# Patient Record
Sex: Female | Born: 1935
Health system: Southern US, Community
[De-identification: ages and names within clinical notes are randomized; demographics above are authoritative.]

## PROBLEM LIST (undated history)

## (undated) DIAGNOSIS — I10 Essential (primary) hypertension: Secondary | ICD-10-CM

## (undated) DIAGNOSIS — D126 Benign neoplasm of colon, unspecified: Secondary | ICD-10-CM

## (undated) DIAGNOSIS — E039 Hypothyroidism, unspecified: Secondary | ICD-10-CM

## (undated) DIAGNOSIS — G629 Polyneuropathy, unspecified: Secondary | ICD-10-CM

## (undated) DIAGNOSIS — K5790 Diverticulosis of intestine, part unspecified, without perforation or abscess without bleeding: Secondary | ICD-10-CM

## (undated) DIAGNOSIS — K219 Gastro-esophageal reflux disease without esophagitis: Secondary | ICD-10-CM

## (undated) DIAGNOSIS — C541 Malignant neoplasm of endometrium: Secondary | ICD-10-CM

## (undated) DIAGNOSIS — H8110 Benign paroxysmal vertigo, unspecified ear: Secondary | ICD-10-CM

## (undated) DIAGNOSIS — R413 Other amnesia: Secondary | ICD-10-CM

## (undated) HISTORY — DX: Polyneuropathy, unspecified: G62.9

## (undated) HISTORY — PX: CATARACT EXTRACTION: SUR2

## (undated) HISTORY — DX: Diverticulosis of intestine, part unspecified, without perforation or abscess without bleeding: K57.90

## (undated) HISTORY — PX: CHOLECYSTECTOMY: SHX55

## (undated) HISTORY — DX: Benign neoplasm of colon, unspecified: D12.6

## (undated) HISTORY — PX: FOOT SURGERY: SHX648

## (undated) HISTORY — DX: Benign paroxysmal vertigo, unspecified ear: H81.10

## (undated) HISTORY — DX: Essential (primary) hypertension: I10

## (undated) HISTORY — PX: VAGINAL HYSTERECTOMY: SUR661

## (undated) HISTORY — DX: Gastro-esophageal reflux disease without esophagitis: K21.9

## (undated) HISTORY — PX: KNEE SURGERY: SHX244

## (undated) HISTORY — DX: Malignant neoplasm of endometrium: C54.1

## (undated) HISTORY — DX: Hypothyroidism, unspecified: E03.9

## (undated) HISTORY — DX: Other amnesia: R41.3

---

## 1997-01-30 ENCOUNTER — Encounter: Payer: Self-pay | Admitting: Gastroenterology

## 1997-07-11 ENCOUNTER — Ambulatory Visit (HOSPITAL_COMMUNITY): Admission: RE | Admit: 1997-07-11 | Discharge: 1997-07-11 | Payer: Self-pay | Admitting: Family Medicine

## 1997-08-07 ENCOUNTER — Other Ambulatory Visit: Admission: RE | Admit: 1997-08-07 | Discharge: 1997-08-07 | Payer: Self-pay | Admitting: Family Medicine

## 1998-04-12 DIAGNOSIS — D126 Benign neoplasm of colon, unspecified: Secondary | ICD-10-CM

## 1998-04-12 HISTORY — DX: Benign neoplasm of colon, unspecified: D12.6

## 1998-04-22 ENCOUNTER — Other Ambulatory Visit: Admission: RE | Admit: 1998-04-22 | Discharge: 1998-04-22 | Payer: Self-pay | Admitting: Gastroenterology

## 1998-04-22 ENCOUNTER — Encounter: Payer: Self-pay | Admitting: Gastroenterology

## 1998-07-17 ENCOUNTER — Ambulatory Visit (HOSPITAL_COMMUNITY): Admission: RE | Admit: 1998-07-17 | Discharge: 1998-07-17 | Payer: Self-pay | Admitting: Family Medicine

## 1998-10-06 ENCOUNTER — Encounter (INDEPENDENT_AMBULATORY_CARE_PROVIDER_SITE_OTHER): Payer: Self-pay

## 1998-10-06 ENCOUNTER — Inpatient Hospital Stay (HOSPITAL_COMMUNITY): Admission: RE | Admit: 1998-10-06 | Discharge: 1998-10-10 | Payer: Self-pay | Admitting: *Deleted

## 1999-01-15 ENCOUNTER — Other Ambulatory Visit: Admission: RE | Admit: 1999-01-15 | Discharge: 1999-01-15 | Payer: Self-pay | Admitting: *Deleted

## 1999-04-16 ENCOUNTER — Other Ambulatory Visit: Admission: RE | Admit: 1999-04-16 | Discharge: 1999-04-16 | Payer: Self-pay | Admitting: *Deleted

## 1999-07-20 ENCOUNTER — Ambulatory Visit (HOSPITAL_COMMUNITY): Admission: RE | Admit: 1999-07-20 | Discharge: 1999-07-20 | Payer: Self-pay | Admitting: *Deleted

## 1999-08-04 ENCOUNTER — Other Ambulatory Visit: Admission: RE | Admit: 1999-08-04 | Discharge: 1999-08-04 | Payer: Self-pay | Admitting: *Deleted

## 1999-10-30 ENCOUNTER — Other Ambulatory Visit: Admission: RE | Admit: 1999-10-30 | Discharge: 1999-10-30 | Payer: Self-pay | Admitting: *Deleted

## 2000-03-11 ENCOUNTER — Other Ambulatory Visit: Admission: RE | Admit: 2000-03-11 | Discharge: 2000-03-11 | Payer: Self-pay | Admitting: *Deleted

## 2000-06-10 ENCOUNTER — Other Ambulatory Visit: Admission: RE | Admit: 2000-06-10 | Discharge: 2000-06-10 | Payer: Self-pay | Admitting: *Deleted

## 2000-09-08 ENCOUNTER — Encounter: Payer: Self-pay | Admitting: Family Medicine

## 2000-09-08 ENCOUNTER — Ambulatory Visit (HOSPITAL_COMMUNITY): Admission: RE | Admit: 2000-09-08 | Discharge: 2000-09-08 | Payer: Self-pay | Admitting: Family Medicine

## 2001-04-14 ENCOUNTER — Observation Stay (HOSPITAL_COMMUNITY): Admission: RE | Admit: 2001-04-14 | Discharge: 2001-04-15 | Payer: Self-pay | Admitting: Orthopedic Surgery

## 2001-04-14 ENCOUNTER — Encounter: Payer: Self-pay | Admitting: Orthopedic Surgery

## 2001-07-03 ENCOUNTER — Other Ambulatory Visit: Admission: RE | Admit: 2001-07-03 | Discharge: 2001-07-03 | Payer: Self-pay | Admitting: *Deleted

## 2001-09-21 ENCOUNTER — Encounter: Payer: Self-pay | Admitting: Family Medicine

## 2001-09-21 ENCOUNTER — Ambulatory Visit (HOSPITAL_COMMUNITY): Admission: RE | Admit: 2001-09-21 | Discharge: 2001-09-21 | Payer: Self-pay | Admitting: Family Medicine

## 2002-01-26 ENCOUNTER — Encounter: Payer: Self-pay | Admitting: Family Medicine

## 2002-01-26 ENCOUNTER — Encounter: Admission: RE | Admit: 2002-01-26 | Discharge: 2002-01-26 | Payer: Self-pay | Admitting: Family Medicine

## 2002-09-21 ENCOUNTER — Other Ambulatory Visit: Admission: RE | Admit: 2002-09-21 | Discharge: 2002-09-21 | Payer: Self-pay | Admitting: *Deleted

## 2002-09-27 ENCOUNTER — Ambulatory Visit (HOSPITAL_COMMUNITY): Admission: RE | Admit: 2002-09-27 | Discharge: 2002-09-27 | Payer: Self-pay | Admitting: Family Medicine

## 2002-09-27 ENCOUNTER — Encounter: Payer: Self-pay | Admitting: Family Medicine

## 2003-09-30 ENCOUNTER — Ambulatory Visit (HOSPITAL_COMMUNITY): Admission: RE | Admit: 2003-09-30 | Discharge: 2003-09-30 | Payer: Self-pay | Admitting: Family Medicine

## 2003-10-08 ENCOUNTER — Encounter: Payer: Self-pay | Admitting: Gastroenterology

## 2004-10-26 ENCOUNTER — Ambulatory Visit (HOSPITAL_COMMUNITY): Admission: RE | Admit: 2004-10-26 | Discharge: 2004-10-26 | Payer: Self-pay | Admitting: Family Medicine

## 2005-09-08 ENCOUNTER — Ambulatory Visit: Payer: Self-pay | Admitting: Gastroenterology

## 2005-10-19 ENCOUNTER — Ambulatory Visit: Payer: Self-pay | Admitting: Gastroenterology

## 2005-11-18 ENCOUNTER — Ambulatory Visit (HOSPITAL_COMMUNITY): Admission: RE | Admit: 2005-11-18 | Discharge: 2005-11-18 | Payer: Self-pay | Admitting: Family Medicine

## 2006-12-09 ENCOUNTER — Ambulatory Visit (HOSPITAL_COMMUNITY): Admission: RE | Admit: 2006-12-09 | Discharge: 2006-12-09 | Payer: Self-pay | Admitting: Family Medicine

## 2007-01-31 ENCOUNTER — Ambulatory Visit: Payer: Self-pay | Admitting: Gastroenterology

## 2007-12-11 ENCOUNTER — Ambulatory Visit (HOSPITAL_COMMUNITY): Admission: RE | Admit: 2007-12-11 | Discharge: 2007-12-11 | Payer: Self-pay | Admitting: Family Medicine

## 2007-12-22 ENCOUNTER — Encounter: Admission: RE | Admit: 2007-12-22 | Discharge: 2007-12-22 | Payer: Self-pay | Admitting: Family Medicine

## 2008-01-10 DIAGNOSIS — Z860100 Personal history of colon polyps, unspecified: Secondary | ICD-10-CM | POA: Insufficient documentation

## 2008-01-16 ENCOUNTER — Ambulatory Visit: Payer: Self-pay | Admitting: Gastroenterology

## 2008-01-16 DIAGNOSIS — T8189XA Other complications of procedures, not elsewhere classified, initial encounter: Secondary | ICD-10-CM | POA: Insufficient documentation

## 2008-01-16 DIAGNOSIS — K219 Gastro-esophageal reflux disease without esophagitis: Secondary | ICD-10-CM | POA: Insufficient documentation

## 2008-01-16 DIAGNOSIS — R197 Diarrhea, unspecified: Secondary | ICD-10-CM | POA: Insufficient documentation

## 2008-08-20 ENCOUNTER — Encounter (INDEPENDENT_AMBULATORY_CARE_PROVIDER_SITE_OTHER): Payer: Self-pay | Admitting: *Deleted

## 2008-09-18 ENCOUNTER — Telehealth: Payer: Self-pay | Admitting: Gastroenterology

## 2008-09-20 ENCOUNTER — Ambulatory Visit: Payer: Self-pay | Admitting: Gastroenterology

## 2008-09-20 DIAGNOSIS — R1011 Right upper quadrant pain: Secondary | ICD-10-CM | POA: Insufficient documentation

## 2008-09-23 ENCOUNTER — Ambulatory Visit: Payer: Self-pay | Admitting: Gastroenterology

## 2009-01-28 ENCOUNTER — Ambulatory Visit (HOSPITAL_COMMUNITY): Admission: RE | Admit: 2009-01-28 | Discharge: 2009-01-28 | Payer: Self-pay | Admitting: Family Medicine

## 2009-05-13 ENCOUNTER — Encounter: Admission: RE | Admit: 2009-05-13 | Discharge: 2009-08-11 | Payer: Self-pay | Admitting: Specialist

## 2010-02-02 ENCOUNTER — Ambulatory Visit (HOSPITAL_COMMUNITY)
Admission: RE | Admit: 2010-02-02 | Discharge: 2010-02-02 | Payer: Self-pay | Source: Home / Self Care | Attending: Family Medicine | Admitting: Family Medicine

## 2010-05-26 NOTE — Assessment & Plan Note (Signed)
[facility]                         GASTROENTEROLOGY OFFICE NOTE   NAME:SNIDERArienne, Gartin                    MRN:          [id number]  DATE:01/31/2007                            DOB:          1931/03/16    HISTORY:  Ms. Sachs returns for follow up of chronic diarrhea and GERD.  She states she is having 2-3 loose stools per day.  Sometimes her stools  are urgent and she has had 1-2 episodes of incontinence.  She was  recently evaluated by Dr. Collins Scotland and she encouraged to use her Imodium  more frequently and this appears to have had some benefit.  However, she  is still using Imodium only intermittently.  She has had episodic  postprandial nausea and reflux symptoms.   CURRENT MEDICATIONS:  Listed on the chart, updated and reviewed.   MEDICATION ALLERGIES:  PENICILLIN.   PHYSICAL EXAMINATION:  GENERAL:  No acute distress.  VITAL SIGNS:  Weight 173.8 pounds, blood pressure 130/68, pulse 60 and  regular.  CHEST:  Clear to auscultation bilaterally.  CARDIAC:  Regular rate and rhythm without murmurs.  ABDOMEN:  Soft, nontender and nondistended.  Normoactive bowel sounds.  No palpable organomegaly, masses or hernias.   ASSESSMENT/PLAN:  1. Chronic post-cholecystectomy diarrhea.  Continue Questran b.i.d.      She is again encouraged to use Imodium AD b.i.d. p.r.n.  I have      reassured her that both Imodium and Questran are very safe to use      either alone or in combination for the long-term.  2. Personal history of tubuloovillous adenoma.  Surveillance      colonoscopy recommended in September 2010.  3. Chronic gastroesophageal reflux disease with history of a duodenal      ulcer.  Her reflux symptoms are under good control.  We will      attempt to improve management with an increase to omeprazole 40 mg      p.o. q.a.m. and re-intense antireflux measures.  Return office      visit in 1 year.     Venita Lick. Russella Dar, MD, [facility]  Electronically  Signed    MTS/MedQ  DD: 01/31/2007  DT: 01/31/2007  Job #: [id number]   cc:   Tammy R. Collins Scotland, M.D.

## 2010-05-29 NOTE — Assessment & Plan Note (Signed)
[facility]                           GASTROENTEROLOGY OFFICE NOTE   NAME:Kristina [name], Kristina [name]                    MRN:          [id number]  DATE:10/19/2005                            DOB:          Jun 11, 1931    Mrs. [name]'s diarrhea has resolved since beginning Questran twice a day and  taking a course of metronidazole. She feels that the [name] has been the  main factor controlling her symptoms. Blood work obtained at the last office  visit revealed an elevated glucose at 164, a low albumin at 3.1 and an  elevated TSH at 7.13.  Dr. Collins Scotland is providing further follow-up for these  abnormalities. Her reflux symptoms are under good control and she generally  feels well.   CURRENT MEDICATIONS:  As listed in the chart, updated and reviewed.   MEDICATION ALLERGIES:  PENICILLIN.   PHYSICAL EXAMINATION:  In no acute distress. Weight: 177.8 lb. Blood  pressure: 122/64. Pulse: 60 and regular.  CHEST: Clear to auscultation bilaterally.  CARDIAC: Regular rate and rhythm without murmurs.  ABDOMEN: Soft and nontender with normoactive bowel sounds.   ASSESSMENT/PLAN:  1. Chronic diarrhea presumably secondary to bile salts. Trial of      discontinuing omeprazole for one week to rule-out medication side      effects. Maintain Questran b.i.d. p.r.n. and Imodium b.i.d. p.r.n.      longterm.  2. Chronic GERD and a history of duodenal ulcer. Continue omeprazole if it      proves not to be a factor in her diarrhea, otherwise we will change      medications and she will notify us by phone within the next week or      two.  3. Personal history of tubulovillous adenoma. Recall colonoscopy due in      September 2010.       Venita Lick. Russella Dar, MD, [facility]      MTS/MedQ  DD:  10/19/2005  DT:  10/20/2005  Job #:  [id number]

## 2010-05-29 NOTE — Assessment & Plan Note (Signed)
[facility]                           GASTROENTEROLOGY OFFICE NOTE   NAME:SNIDERHazell, Siwik                    MRN:          [id number]  DATE:09/08/2005                            DOB:          24-Oct-1931    REASON FOR REFERRAL:  Worsening diarrhea.   HISTORY OF PRESENT ILLNESS:  Kristina [name] is a 77 year old white female that I  have seen in the past with a history of gastroesophageal reflux disease,  ulcer disease, and chronic diarrhea felt to be related to bile salts.  Her  diarrhea was generally well-controlled with Questran for many years;  however, over the past 3 to 4 months, she has noted an increase in diarrhea  occurring up to 6 to 7 times a day and also associated with 1 or 2 episodes  of incontinence.  She notes no bleeding, weight loss, abdominal pian,  fevers, chills, nausea, or vomiting.  She does not recall taking any  antibiotics over the past few months except for Avalox within the past 2  weeks.  Her last colonoscopy was performed in September of 2005 which showed  only mild diverticulosis.  There were no recurrent polyps.  She has been  using Imodium AD on a p.r.n. basis with some success in controlling her  diarrhea.  She recently saw Dr. Collins Scotland and an arthritis panel was performed  showing an elevated erythrocyte sedimentation rate at 35.  Uric acid, ANA  and rheumatoid factor were negative.  CBC was remarkable for a minimally  elevated white blood cell count at 11.  It was otherwise negative.   PAST MEDICAL HISTORY:  1. Tubulovillous adenomatous colon polyps.  2. Gastroesophageal reflux disease.  3. Bile salt diarrhea.  4. Diverticulosis.  5. Status post hysterectomy for endometrial cancer.  6. Status post right foot surgery for a heel spur.  7. Status post cholecystectomy.  8. History of a duodenal bulb ulcer.   MEDICATIONS:  Listed on the chart, updated and reviewed.   MEDICATION ALLERGIES:  PENICILLIN leading to a  rash.   SOCIAL HISTORY AND REVIEW OF SYSTEMS:  See the diagnostic evaluation form.   PHYSICAL EXAMINATION:  GENERAL:  No acute distress.  VITAL SIGNS:  Height 5 feet 2 inches. Weight 180 pounds.  Blood pressure is  122/60, pulse 82 and regular.  HEENT EXAM:  Anicteric sclerae.  Oropharynx clear.  Neck without thyromegaly  or adenopathy.  CHEST:  Clear to auscultation bilaterally.  CARDIAC:  Regular rate and rhythm without murmurs appreciated.  ABDOMEN:  Soft, nontender, nondistended.  Normoactive bowel sounds.  No  palpable organomegaly.  RECTAL EXAMINATION:  Deferred.  EXTREMITIES:  Without clubbing, cyanosis, or edema.  NEUROLOGICAL:  Alert and oriented times 3.  Grossly nonfocal.   ASSESSMENT/PLAN:  1. Worsening of chronic diarrhea.  History of bile salt diarrhea.  Rule      out infectious etiology.  Celiac disease and an exacerbation of bile      salt diarrhea, less likely inflammatory bowel disease.  Empiric trial      of metronidazole 500 mg b.i.d. for 10 day.  Obtain all standard stool  cultures, a C-MET, TSH, tissue transglutaminase, an IgA.  Increase      Questran to b.i.d. and use Imodium AD b.i.d. p.r.n.  2. Chronic gastroesophageal reflux disease.  History of duodenal ulcer      disease.  Renew omeprazole 20 mg p.o. every a.m. and continue      antireflux measures.  3. Personal history of tubulovillous adenoma.  Recall colonoscopy for      September 2010.                                   Venita Lick. Pleas Koch., MD, [facility]   MTS/MedQ  DD:  09/08/2005  DT:  09/09/2005  Job #:  [id number]   cc:   Tammy R. Collins Scotland, MD

## 2010-05-29 NOTE — Op Note (Signed)
[facility]  Patient:    Kristina [name], Kristina [name] Visit Number: [id number] MRN: [id number]          Service Type: SUR Location: 4W [id number] [id number] Attending Physician:  Sherri Rad Dictated by:   Sherri Rad, M.D. Proc. Date: 04/14/01 Admit Date:  04/14/2001 Discharge Date: 04/15/2001                             Operative Report  PREOPERATIVE DIAGNOSES:  1. Right Haglunds deformity.  2. Right tight gastroc.  3. Right calcification Achilles tendon.  POSTOPERATIVE DIAGNOSES:  1. Right Haglunds deformity.  2. Right tight gastroc.  3. Right calcification Achilles tendon.  OPERATION PERFORMED:  1. Excision right Haglunds deformity.  2. Excision calcification Achilles tendon.  3. Right gastroc slide.  ANESTHESIA:  General endotracheal tube.  SURGEON:  Sherri Rad, M.D.  ASSISTANTJill Side P. Mahar, P.A.  ESTIMATED BLOOD LOSS:  Minimal.  TOURNIQUET TIME:  47 minutes.  COMPLICATIONS:  None.  DISPOSITION:  Stable P.R.  INDICATIONS FOR PROCEDURE:  This is a 77 year old female who has had persistent long standing posterior right heel pain that was resistant to conservative management. She was consented for the above procedure. All risks which include infection, neurovessel injury, persistent pain, worsening of pain, Achilles tendon, rupture, possible future surgery, DVT and possible PE were all explained. Questions were answered.  DESCRIPTION OF PROCEDURE:  The patient was brought to the operating room, placed initially in supine position after adequate general endotracheal tube anesthesia was administered as well as Ancef 1 gm IV piggyback. She was then placed in the prone position, all bony prominences well padded after the tourniquet was placed on the proximal right thigh. The right lower extremity was then prepped and draped in a sterile manner. The procedure commenced with a longitudinal incision over the gastroc muscle tenden at its  junction, dissection was carried down through adipose tissue, hemostasis was obtained. The fascia was opened in line with the incision. The muscle tendinous junction and the gastrocnemius tendon was identified. The sural nerve was elevated off the posterior aspect of the gastrocnemius tendon and protected out of harms way for the remaining portion of the procedure. The gastrocnemius tendon was then released with the Mayo scissors. The wound was copiously irrigated with normal saline, subcu was closed with 3-0 Vicryl and skin was closed with 4-0 monocryl subcuticular stitch. We made made a longitudinal incision on the lateral border of the Achilles tendon. Dissection was carried down anterior to Achilles tendon into the retrocalcaneal bursa. A medial incision was made on the medial aspect of the Achilles tendon and again this was again dissected down to the retrocalcaneal bursal area. The tendon was then sharply elevated off the calcification within the Achilles tendon on either side of the heel until the complete calcification was removed. Then with an oscillating saw, both the Haglunds deformity and the calcification of the Achilles tendon was excised. A synovectomy rongeur as well as a regular rongeur were used to round the corners of the osteotomy as well as remove any remaining calcification in the area. A C-arm film with a lateral picture was obtained and illustrated the complete resection of the calcification as well as the Haglunds deformity. The Achilles tendon was inspected and was intact within its posterior fibers. The anterior fibers to the superior portion of the ______ tuber obviously were released. We elected not to perform an FHL to calcaneus  transfer at this time. The tourniquet was elevated at the beginning at the procedure and was deflated at the end of the procedure. Hemostasis was obtained. The wound was closed with 3-0 Vicryl, skin was closed with 4-0 nylon. A sterile  dressing was applied. A Jones dressing was applied with the foot in neutral position. The patient was stable to P.R.   Dictated by:   Sherri Rad, M.D. Attending Physician:  Sherri Rad DD:  04/14/01 TD:  04/14/01 Job: [id number]

## 2010-10-30 ENCOUNTER — Ambulatory Visit (INDEPENDENT_AMBULATORY_CARE_PROVIDER_SITE_OTHER): Payer: Medicare Other | Admitting: Gastroenterology

## 2010-10-30 ENCOUNTER — Other Ambulatory Visit (INDEPENDENT_AMBULATORY_CARE_PROVIDER_SITE_OTHER): Payer: Medicare Other

## 2010-10-30 ENCOUNTER — Encounter: Payer: Self-pay | Admitting: Gastroenterology

## 2010-10-30 VITALS — BP 120/78 | HR 80 | Ht 62.0 in | Wt 167.0 lb

## 2010-10-30 DIAGNOSIS — R1031 Right lower quadrant pain: Secondary | ICD-10-CM

## 2010-10-30 NOTE — Patient Instructions (Signed)
Go directly to the basement today to have your labs drawn.  You have been scheduled for a CT scan of the abdomen and pelvis at [facility] ([address]---this is in the same building as [facility]).   You are scheduled on 11/20/10 at 9:00am. You should arrive 15 minutes prior to your appointment time for registration. Please follow the written instructions below on the day of your exam:  WARNING: IF YOU ARE ALLERGIC TO IODINE/X-RAY DYE, PLEASE NOTIFY RADIOLOGY IMMEDIATELY AT [phone]! YOU WILL BE GIVEN A 13 HOUR PREMEDICATION PREP.  1) Do not eat or drink anything after 5:00am (4 hours prior to your test) 2) You have been given 2 bottles of oral contrast to drink. The solution may taste               better if refrigerated, but do NOT add ice or any other liquid to this solution. Shake             well before drinking.    Drink 1 bottle of contrast @ 7:00am (2 hours prior to your exam)  Drink 1 bottle of contrast @ 8:00am (1 hour prior to your exam)  You may take any medications as prescribed with a small amount of water except for the following: Metformin, Glucophage, Glucovance, Avandamet, Riomet, Fortamet, Actoplus Met, Janumet, Glumetza or Metaglip. The above medications must be held the day of the exam AND 48 hours after the exam.  The purpose of you drinking the oral contrast is to aid in the visualization of your intestinal tract. The contrast solution may cause some diarrhea. Before your exam is started, you will be given a small amount of fluid to drink. Depending on your individual set of symptoms, you may also receive an intravenous injection of x-ray contrast/dye. Plan on being at [facility] for 30 minutes or long, depending on the type of exam you are having performed.  If you have any questions regarding your exam or if you need to reschedule, you may call the CT department at [phone] between the hours of 8:00 am and 5:00 pm,  Monday-Friday.  ________________________________________________________________________   cc: Herb Grays, MD

## 2010-10-30 NOTE — Progress Notes (Signed)
History of Present Illness: This is a 77 year old female who relates a several month history of right-sided abdominal pain and right flank pain. Her symptoms worsened when she lies on her right side at night. Her symptoms are mild in severity and constant. Her symptoms did not change with movement or any digestive function and her chronic diarrhea is relatively well controlled on loperamide twice daily. She underwent upper endoscopy and colonoscopy this in September 2010. Denies weight loss, constipation, change in stool caliber, melena, hematochezia, nausea, vomiting, dysphagia, reflux symptoms, chest pain.  Current Medications, Allergies, Past Medical History, Past Surgical History, Family History and Social History were reviewed in [employer] record.  Physical Exam: General: Well developed , well nourished, no acute distress Head: Normocephalic and atraumatic Eyes:  sclerae anicteric, EOMI Ears: Normal auditory acuity Mouth: No deformity or lesions Lungs: Clear throughout to auscultation Heart: Regular rate and rhythm; no murmurs, rubs or bruits Abdomen: Soft, mild generalized tenderness across her right abdomen and right flank no rebound or guarding, non distended. No masses, hepatosplenomegaly or hernias noted. Normal Bowel sounds Musculoskeletal: Symmetrical with no gross deformities  Pulses:  Normal pulses noted Extremities: No clubbing, cyanosis, edema or deformities noted Neurological: Alert oriented x 4, grossly nonfocal Psychological:  Alert and cooperative. Normal mood and affect  Assessment and Recommendations:  1. Right sided abdominal pain and right flank pain. This does not appear to be gastrointestinal problem. I suspect this is abdominal wall pain or muscle strain. Begin Tylenol 2 tablets 3 times a day as needed. Recent blood work Dr. Alda Berthold office was unremarkable. Schedule CT scan of the abdomen and pelvis to an intra-abdominal or pelvic  process.  2. Personal history of adenomatous colon polyps. Surveillance colonoscopy recommended September 2015.  3. Postcholecystectomy diarrhea. Continue loperamide twice a day when necessary.  4. GERD and a history of a duodenal ulcer. Continue omeprazole 20 mg daily and standard antireflux measures.

## 2010-11-20 ENCOUNTER — Ambulatory Visit (INDEPENDENT_AMBULATORY_CARE_PROVIDER_SITE_OTHER)
Admission: RE | Admit: 2010-11-20 | Discharge: 2010-11-20 | Disposition: A | Discharge status: Home / Self Care | Payer: Medicare Other | Source: Ambulatory Visit | Attending: Gastroenterology | Admitting: Gastroenterology

## 2010-11-20 DIAGNOSIS — R1031 Right lower quadrant pain: Secondary | ICD-10-CM

## 2010-11-20 MED ORDER — IOHEXOL 300 MG/ML  SOLN
100.0000 mL | Freq: Once | INTRAMUSCULAR | Status: AC | PRN
  Administered 2010-11-20: 100 mL via INTRAVENOUS

## 2010-11-23 ENCOUNTER — Other Ambulatory Visit: Payer: Self-pay | Admitting: Gastroenterology

## 2010-11-23 DIAGNOSIS — R9389 Abnormal findings on diagnostic imaging of other specified body structures: Secondary | ICD-10-CM

## 2010-11-26 ENCOUNTER — Other Ambulatory Visit (INDEPENDENT_AMBULATORY_CARE_PROVIDER_SITE_OTHER): Payer: Medicare Other

## 2010-11-26 DIAGNOSIS — R9389 Abnormal findings on diagnostic imaging of other specified body structures: Secondary | ICD-10-CM

## 2010-11-26 LAB — HEPATIC FUNCTION PANEL
AST: 20 U/L (ref 0–37)
Albumin: 3.6 g/dL (ref 3.5–5.2)
Alkaline Phosphatase: 56 U/L (ref 39–117)
Bilirubin, Direct: 0.1 mg/dL (ref 0.0–0.3)
Total Protein: 7.8 g/dL (ref 6.0–8.3)

## 2011-02-22 ENCOUNTER — Other Ambulatory Visit (HOSPITAL_COMMUNITY): Payer: Self-pay | Admitting: Family Medicine

## 2011-02-22 DIAGNOSIS — Z1231 Encounter for screening mammogram for malignant neoplasm of breast: Secondary | ICD-10-CM

## 2011-03-18 ENCOUNTER — Ambulatory Visit (HOSPITAL_COMMUNITY)
Admission: RE | Admit: 2011-03-18 | Discharge: 2011-03-18 | Disposition: A | Discharge status: Home / Self Care | Payer: Medicare Other | Source: Ambulatory Visit | Attending: Family Medicine | Admitting: Family Medicine

## 2011-03-18 DIAGNOSIS — Z1231 Encounter for screening mammogram for malignant neoplasm of breast: Secondary | ICD-10-CM | POA: Insufficient documentation

## 2012-03-20 ENCOUNTER — Other Ambulatory Visit (HOSPITAL_COMMUNITY): Payer: Self-pay | Admitting: Family Medicine

## 2012-03-20 DIAGNOSIS — Z1231 Encounter for screening mammogram for malignant neoplasm of breast: Secondary | ICD-10-CM

## 2012-03-27 ENCOUNTER — Ambulatory Visit (HOSPITAL_COMMUNITY): Payer: Medicare Other

## 2012-03-31 ENCOUNTER — Ambulatory Visit (HOSPITAL_COMMUNITY)
Admission: RE | Admit: 2012-03-31 | Discharge: 2012-03-31 | Disposition: A | Discharge status: Home / Self Care | Payer: Medicare Other | Source: Ambulatory Visit | Attending: Family Medicine | Admitting: Family Medicine

## 2012-03-31 DIAGNOSIS — Z1231 Encounter for screening mammogram for malignant neoplasm of breast: Secondary | ICD-10-CM

## 2012-07-18 ENCOUNTER — Other Ambulatory Visit: Payer: Self-pay | Admitting: Family Medicine

## 2012-07-21 ENCOUNTER — Other Ambulatory Visit: Payer: Self-pay | Admitting: Family Medicine

## 2012-07-21 DIAGNOSIS — N760 Acute vaginitis: Secondary | ICD-10-CM

## 2012-08-03 ENCOUNTER — Other Ambulatory Visit: Payer: Medicare Other

## 2012-08-08 ENCOUNTER — Ambulatory Visit
Admission: RE | Admit: 2012-08-08 | Discharge: 2012-08-08 | Disposition: A | Discharge status: Home / Self Care | Payer: Medicare Other | Source: Ambulatory Visit | Attending: Family Medicine | Admitting: Family Medicine

## 2012-08-08 DIAGNOSIS — N760 Acute vaginitis: Secondary | ICD-10-CM

## 2013-02-26 ENCOUNTER — Other Ambulatory Visit (HOSPITAL_COMMUNITY): Payer: Self-pay | Admitting: Family Medicine

## 2013-02-26 DIAGNOSIS — Z1231 Encounter for screening mammogram for malignant neoplasm of breast: Secondary | ICD-10-CM

## 2013-04-02 ENCOUNTER — Ambulatory Visit (HOSPITAL_COMMUNITY): Payer: Medicare Other

## 2013-04-10 ENCOUNTER — Ambulatory Visit (HOSPITAL_COMMUNITY)
Admission: RE | Admit: 2013-04-10 | Discharge: 2013-04-10 | Disposition: A | Discharge status: Home / Self Care | Payer: Medicare Other | Source: Ambulatory Visit | Attending: Family Medicine | Admitting: Family Medicine

## 2013-04-10 DIAGNOSIS — Z1231 Encounter for screening mammogram for malignant neoplasm of breast: Secondary | ICD-10-CM | POA: Insufficient documentation

## 2013-07-27 ENCOUNTER — Encounter: Payer: Self-pay | Admitting: Gastroenterology

## 2013-08-06 ENCOUNTER — Telehealth: Payer: Self-pay | Admitting: Gastroenterology

## 2013-08-06 NOTE — Telephone Encounter (Signed)
Patient agrees to schedule. Appointments made.

## 2013-09-03 ENCOUNTER — Ambulatory Visit (AMBULATORY_SURGERY_CENTER): Payer: Medicare Other | Admitting: *Deleted

## 2013-09-03 VITALS — Ht 62.0 in | Wt 182.4 lb

## 2013-09-03 DIAGNOSIS — Z86010 Personal history of colonic polyps: Secondary | ICD-10-CM

## 2013-09-03 MED ORDER — MOVIPREP 100 G PO SOLR: ORAL | Status: DC

## 2013-09-03 NOTE — Progress Notes (Signed)
Patient denies any allergies to eggs or soy. Patient denies any problems with anesthesia/sedation. Patient denies any oxygen use at home and does not take any diet/weight loss medications. "computer broken" per patient.

## 2013-09-12 ENCOUNTER — Encounter: Payer: Medicare Other | Admitting: Gastroenterology

## 2013-09-14 ENCOUNTER — Ambulatory Visit (AMBULATORY_SURGERY_CENTER): Payer: Medicare Other | Admitting: Gastroenterology

## 2013-09-14 ENCOUNTER — Encounter: Payer: Self-pay | Admitting: Gastroenterology

## 2013-09-14 VITALS — BP 161/117 | HR 62 | Temp 97.5°F | Resp 24 | Ht 62.0 in | Wt 182.0 lb

## 2013-09-14 DIAGNOSIS — D123 Benign neoplasm of transverse colon: Secondary | ICD-10-CM

## 2013-09-14 DIAGNOSIS — D120 Benign neoplasm of cecum: Secondary | ICD-10-CM

## 2013-09-14 DIAGNOSIS — Z86010 Personal history of colonic polyps: Secondary | ICD-10-CM

## 2013-09-14 DIAGNOSIS — D126 Benign neoplasm of colon, unspecified: Secondary | ICD-10-CM

## 2013-09-14 DIAGNOSIS — D124 Benign neoplasm of descending colon: Secondary | ICD-10-CM

## 2013-09-14 MED ORDER — SODIUM CHLORIDE 0.9 % IV SOLN: 500.0000 mL | INTRAVENOUS | Status: DC

## 2013-09-14 NOTE — Progress Notes (Signed)
Called to room to assist during endoscopic procedure.  Patient ID and intended procedure confirmed with present staff. Received instructions for my participation in the procedure from the performing physician.  

## 2013-09-14 NOTE — Patient Instructions (Signed)
YOU HAD AN ENDOSCOPIC PROCEDURE TODAY AT THE [facility]: Refer to the procedure report that was given to you for any specific questions about what was found during the examination.  If the procedure report does not answer your questions, please call your gastroenterologist to clarify.  If you requested that your care partner not be given the details of your procedure findings, then the procedure report has been included in a sealed envelope for you to review at your convenience later.  YOU SHOULD EXPECT: Some feelings of bloating in the abdomen. Passage of more gas than usual.  Walking can help get rid of the air that was put into your GI tract during the procedure and reduce the bloating. If you had a lower endoscopy (such as a colonoscopy or flexible sigmoidoscopy) you may notice spotting of blood in your stool or on the toilet paper. If you underwent a bowel prep for your procedure, then you may not have a normal bowel movement for a few days.  DIET: Your first meal following the procedure should be a light meal and then it is ok to progress to your normal diet.  A half-sandwich or bowl of soup is an example of a good first meal.  Heavy or fried foods are harder to digest and may make you feel nauseous or bloated.  Likewise meals heavy in dairy and vegetables can cause extra gas to form and this can also increase the bloating.  Drink plenty of fluids but you should avoid alcoholic beverages for 24 hours.  ACTIVITY: Your care partner should take you home directly after the procedure.  You should plan to take it easy, moving slowly for the rest of the day.  You can resume normal activity the day after the procedure however you should NOT DRIVE or use heavy machinery for 24 hours (because of the sedation medicines used during the test).    SYMPTOMS TO REPORT IMMEDIATELY: A gastroenterologist can be reached at any hour.  During normal business hours, 8:30 AM to 5:00 PM Monday through Friday,  call [phone].  After hours and on weekends, please call the GI answering service at [phone] who will take a message and have the physician on call contact you.   Following lower endoscopy (colonoscopy or flexible sigmoidoscopy):  Excessive amounts of blood in the stool  Significant tenderness or worsening of abdominal pains  Swelling of the abdomen that is new, acute  Fever of 100F or higher  FOLLOW UP: If any biopsies were taken you will be contacted by phone or by letter within the next 1-3 weeks.  Call your gastroenterologist if you have not heard about the biopsies in 3 weeks.  Our staff will call the home number listed on your records the next business day following your procedure to check on you and address any questions or concerns that you may have at that time regarding the information given to you following your procedure. This is a courtesy call and so if there is no answer at the home number and we have not heard from you through the emergency physician on call, we will assume that you have returned to your regular daily activities without incident.  SIGNATURES/CONFIDENTIALITY: You and/or your care partner have signed paperwork which will be entered into your electronic medical record.  These signatures attest to the fact that that the information above on your After Visit Summary has been reviewed and is understood.  Full responsibility of the confidentiality of this   discharge information lies with you and/or your care-partner.  Please, read the handouts given to you by the recovery room nurse.

## 2013-09-14 NOTE — Op Note (Signed)
[facility]  [address] [place], [address]   COLONOSCOPY PROCEDURE REPORT PATIENT: Kristina [name], Kristina [name]  MR#: [id number] BIRTHDATE: 08-Sep-1931 , 81  yrs. old GENDER: Female ENDOSCOPIST: Ladene Artist, MD, [facility] REFERRED QT:[id number] Tollie Pizza, M.D. PROCEDURE DATE:  09/14/2013 PROCEDURE:   Colonoscopy with biopsy and snare polypectomy First Screening Colonoscopy - Avg.  risk and is 50 yrs.  old or older - No.  Prior Negative Screening - Now for repeat screening. N/A  History of Adenoma - Now for follow-up colonoscopy & has been > or = to 3 yrs.  Yes hx of adenoma.  Has been 3 or more years since last colonoscopy.  Polyps Removed Today? Yes. ASA CLASS:   Class II INDICATIONS:Patient's personal history of adenomatous colon polyps.  MEDICATIONS: MAC sedation, administered by CRNA and propofol (Diprivan) 250mg  IV DESCRIPTION OF PROCEDURE:   After the risks benefits and alternatives of the procedure were thoroughly explained, informed consent was obtained.  A digital rectal exam revealed no abnormalities of the rectum.   The [id number]  endoscope was introduced through the anus and advanced to the cecum, which was identified by both the appendix and ileocecal valve. No adverse events experienced with a tortuous colon.   The quality of the prep was good, using MoviPrep  The instrument was then slowly withdrawn as the colon was fully examined.  COLON FINDINGS: A sessile polyp measuring 1 cm in size was found at the cecum.  A polypectomy was performed using snare cautery. The resection was complete and the polyp tissue was completely retrieved.  A sessile polyp measuring 8 mm in size was found in the transverse colon. A polypectomy was performed using snare cautery. The resection was complete and the polyp tissue was completely retrieved. Two sessile polyps measuring 4-6 mm in size were found in the descending colon.  A polypectomy was performed with a cold snare  and with cold forceps. The resection was complete and the polyp tissue was completely retrieved. Moderate diverticulosis was noted in the sigmoid colon. The colon was otherwise normal.  There was no diverticulosis, inflammation, polyps or cancers unless previously stated.  Retroflexed views revealed no abnormalities. The time to cecum=2 minutes 18 seconds.  Withdrawal time=12 minutes 27 seconds.  The scope was withdrawn and the procedure completed. COMPLICATIONS: There were no complications. ENDOSCOPIC IMPRESSION: 1.   Sessile polyp measuring 1 cm at the cecum; polypectomy performed using snare cautery 2.   Sessile polyp measuring 8 mm in the transverse colon; polypectomy performed using snare cautery 3.   Two sessile polyps measuring 4-6 mm in the descending colon; polypectomy performed cold snare and cold forceps 4.   Moderate diverticulosis in the sigmoid colon RECOMMENDATIONS: 1.  Await pathology results 2.  Hold aspirin, aspirin products, and anti-inflammatory medication for 2 weeks. 3.  High fiber diet with liberal fluid intake. 4.  Consider repeat Colonoscopy in 3 years after pathology review otherwise given your age, you will not need another colonoscopy for colon cancer screening or polyp surveillance.  These types of tests usually stop around the age 30. eSigned:  Ladene Artist, MD, [name] 09/14/2013 3:18 PM    PATIENT NAME:  Kristina [name], Kristina [name] MR#: [id number]

## 2013-09-14 NOTE — Progress Notes (Signed)
Procedure ends, to recovery, report given and VSS. 

## 2013-09-18 ENCOUNTER — Telehealth: Payer: Self-pay | Admitting: *Deleted

## 2013-09-18 NOTE — Telephone Encounter (Signed)
  Follow up Call-  Call back number 09/14/2013  Post procedure Call Back phone  # [phone]  Permission to leave phone message Yes     Patient questions:  Do you have a fever, pain , or abdominal swelling? No. Pain Score  0 *  Have you tolerated food without any problems? Yes.    Have you been able to return to your normal activities? Yes.    Do you have any questions about your discharge instructions: Diet   No. Medications  No. Follow up visit  No.  Do you have questions or concerns about your Care? No.  Actions: * If pain score is 4 or above: No action needed, pain <4.

## 2013-09-23 ENCOUNTER — Encounter: Payer: Self-pay | Admitting: Gastroenterology

## 2013-09-27 ENCOUNTER — Ambulatory Visit: Payer: Medicare Other | Admitting: Neurology

## 2014-01-28 ENCOUNTER — Ambulatory Visit (INDEPENDENT_AMBULATORY_CARE_PROVIDER_SITE_OTHER): Payer: Medicare Other | Admitting: Neurology

## 2014-01-28 ENCOUNTER — Encounter: Payer: Self-pay | Admitting: Neurology

## 2014-01-28 VITALS — BP 133/61 | HR 61 | Ht 61.0 in | Wt 179.0 lb

## 2014-01-28 DIAGNOSIS — R292 Abnormal reflex: Secondary | ICD-10-CM

## 2014-01-28 DIAGNOSIS — R202 Paresthesia of skin: Secondary | ICD-10-CM

## 2014-01-28 NOTE — Progress Notes (Signed)
PATIENT: Kristina [name] DOB: [birth year]  HISTORICAL  Kristina [name] is a 81 yo RH referred by his primary care physician Dr. Tollie Pizza, for evaluation of bilateral feet paresthesia  She had a past medical history of hypothyroidism, hypertension, previous history of endometrial cancer, status post hysterectomy many years ago but does not require chemotherapy.  Since November 2015, she noticed bilateral distal toes numbness tingling, burning discomfort, difficulty sleeping, she has nocturia, no gait difficulty, no significant low back pain,  She denies neck pain, no bowel and bladder incontinence  Laboratory evaluation September 2015, vitamin B12 4 1 ESR 49, A1c 6.5, CBC WBC was 12.3, hemoglobin 13.3 CMP was normal, creatinine 1.0 TSH normal to.67  REVIEW OF SYSTEMS: Full 14 system review of systems performed and notable only for toes numbness,  ALLERGIES: Allergies  Allergen Reactions  . Penicillins Rash    HOME MEDICATIONS: Current Outpatient Prescriptions on File Prior to Visit  Medication Sig Dispense Refill  . atenolol (TENORMIN) 25 MG tablet Take 25 mg by mouth daily.      Marland Kitchen levothyroxine (SYNTHROID, LEVOTHROID) 75 MCG tablet Take 75 mcg by mouth daily.       No current facility-administered medications on file prior to visit.    PAST MEDICAL HISTORY: Past Medical History  Diagnosis Date  . GERD (gastroesophageal reflux disease)   . Tubulovillous adenoma polyp of colon 04/1998  . Duodenal ulcer   . Diverticulosis   . Endometrial cancer   . Peripheral neuropathy     PAST SURGICAL HISTORY: Past Surgical History  Procedure Laterality Date  . Vaginal hysterectomy    . Foot surgery      Right  . Cholecystectomy    . Knee surgery      Right    FAMILY HISTORY: Family History  Problem Relation Age of Onset  . Colon cancer Neg Hx     SOCIAL HISTORY:  History   Social History  . Marital Status: Married    Spouse Name: N/A    Number of Children: 3    . Years of Education: 12   Occupational History  . Retired    Social History Main Topics  . Smoking status: Never Smoker   . Smokeless tobacco: Never Used  . Alcohol Use: No  . Drug Use: No  . Sexual Activity: Not on file   Other Topics Concern  . Not on file   Social History Narrative   Lives at home alone.    Widowed.   Retired.   Right hand.   Three children.   High school education.   2 cups coffee daily.     PHYSICAL EXAM   Filed Vitals:   01/28/14 0857  BP: 133/61  Pulse: 61  Height: '5\' 1"'  (1.549 m)  Weight: 179 lb (81.194 kg)    Not recorded      Body mass index is 33.84 kg/(m^2).   Generalized: In no acute distress  Neck: Supple, no carotid bruits   Cardiac: Regular rate rhythm  Pulmonary: Clear to auscultation bilaterally  Musculoskeletal: No deformity  Neurological examination  Mentation: Alert oriented to time, place, history taking, and causual conversation  Cranial nerve II-XII: Pupils were equal round reactive to light. Extraocular movements were full.  Visual field were full on confrontational test. Bilateral fundi were sharp.  Facial sensation and strength were normal. Hearing was intact to finger rubbing bilaterally. Uvula tongue midline.  Head turning and shoulder shrug and were normal and symmetric.Tongue protrusion into  cheek strength was normal.  Motor: Normal tone, bulk and strength.  Sensory: Intact to fine touch, pinprick, preserved vibratory sensation, and proprioception at toes.  Coordination: Normal finger to nose, heel-to-shin bilaterally there was no truncal ataxia  Gait: Rising up from seated position without assistance, normal stance, without trunk ataxia, moderate stride, good arm swing, smooth turning, able to perform tiptoe, and heel walking without difficulty.   Romberg signs: Negative  Deep tendon reflexes: Brachioradialis 2/2, biceps 2/2, triceps 3/3, patellar 3/3,, Achilles 2/2, plantar responses were extensor  bilaterally.   DIAGNOSTIC DATA (LABS, IMAGING, TESTING) - I reviewed patient records, labs, notes, testing and imaging myself where available.  No results found for: WBC, HGB, HCT, MCV, PLT    Component Value Date/Time   BUN 12 10/30/2010 1549   CREATININE 0.9 10/30/2010 1549   PROT 7.8 11/26/2010 1129   ALBUMIN 3.6 11/26/2010 1129   AST 20 11/26/2010 1129   ALT 12 11/26/2010 1129   ALKPHOS 56 11/26/2010 1129   BILITOT 0.4 11/26/2010 1129    ASSESSMENT AND PLAN  Kristina [name] is a 81 y.o. female  Presenting with bilateral ptosis paresthesia, hyperreflexia on examinations, bilateral Babinski signs,  1, differentiation diagnosis including cervical spondylitic myelopathy, peripheral neuropathy 2. MRI cervical. 3. EMG nerve conduction study  Orders Placed This Encounter  Procedures  . MR Cervical Spine Wo Contrast  . NCV with EMG(electromyography)     No Follow-up on file.  Marcial Pacas, M.D. Ph.D.  [facility] [address], [place], [place] [address] [phone]

## 2014-02-05 ENCOUNTER — Encounter: Payer: Medicare Other | Admitting: Neurology

## 2014-02-05 ENCOUNTER — Encounter: Payer: Medicare Other | Admitting: Radiology

## 2014-02-07 ENCOUNTER — Telehealth: Payer: Self-pay

## 2014-02-07 NOTE — Telephone Encounter (Signed)
Reviewed, when she comes back for EMG/NCS, I will exam her again, may consider peer-peer review afterwards,  Sharyn Lull: Please check on her EMG/NCS schedule, you may ask Laverda Sorenson or Collie Siad .

## 2014-02-08 ENCOUNTER — Other Ambulatory Visit: Payer: Self-pay

## 2014-02-08 NOTE — Telephone Encounter (Signed)
FYI

## 2014-02-08 NOTE — Telephone Encounter (Signed)
Hinton Dyer, I spoke to [place] about this patient - she said that she just needs to be scheduled and to check with you.  Could you please contact the patient to schedule and send this note back to me?  I will let Dr. Krista Blue know the appt time.   Thank you, Sharyn Lull

## 2014-02-08 NOTE — Telephone Encounter (Signed)
Patient was scheduled for her NCV/EMG on Jan. 26, 2016 but she canceled.  I called to see if she would like to reschedule but got her voicemail.  Message left asking for a call back to reschedule.

## 2014-02-15 ENCOUNTER — Ambulatory Visit
Admission: RE | Admit: 2014-02-15 | Discharge: 2014-02-15 | Disposition: A | Discharge status: Home / Self Care | Payer: Medicare Other | Source: Ambulatory Visit | Attending: Neurology | Admitting: Neurology

## 2014-02-15 ENCOUNTER — Telehealth: Payer: Self-pay | Admitting: Neurology

## 2014-02-15 DIAGNOSIS — R202 Paresthesia of skin: Secondary | ICD-10-CM

## 2014-02-15 DIAGNOSIS — R292 Abnormal reflex: Secondary | ICD-10-CM

## 2014-02-15 NOTE — Telephone Encounter (Signed)
Will go over MRI findings at her follow up soon

## 2014-02-18 ENCOUNTER — Telehealth: Payer: Self-pay

## 2014-02-18 NOTE — Telephone Encounter (Signed)
Patient aware of results - she has a NCV scheduled on 02/26/14.

## 2014-02-18 NOTE — Telephone Encounter (Addendum)
Sharyn Lull, please call patient, cervical spine showed degenerative changes, but no significant canal, or foraminal stenosis, I will review film at her next visit.  MIR showed Overall mild spondylosis of the cervical spine most notable at C5-6 where a disc bulge effaces the thecal sac without cord deformity. No finding to explain the patient's symptoms.

## 2014-02-18 NOTE — Telephone Encounter (Signed)
Patient calling for MRI results she had on this past Friday. Stated to patient that give office 24-48 hour turn around. Patient was fine with this process and she understood.

## 2014-02-26 ENCOUNTER — Ambulatory Visit (INDEPENDENT_AMBULATORY_CARE_PROVIDER_SITE_OTHER): Payer: Medicare Other | Admitting: Neurology

## 2014-02-26 DIAGNOSIS — R292 Abnormal reflex: Secondary | ICD-10-CM

## 2014-02-26 DIAGNOSIS — R202 Paresthesia of skin: Secondary | ICD-10-CM

## 2014-02-26 NOTE — Procedures (Signed)
   NCS (NERVE CONDUCTION STUDY) WITH EMG (ELECTROMYOGRAPHY) REPORT   STUDY DATE: February 26 2014 PATIENT NAME: Kristina [name] DOB: 1931/09/23 MRN: [id number]    TECHNOLOGIST: Laretta Alstrom ELECTROMYOGRAPHER: Marcial Pacas M.D.  CLINICAL INFORMATION: 81 years old female, with two-month history of subacute onset ascending paresthesia, from bilateral feet to midshin now.  FINDINGS: NERVE CONDUCTION STUDY: Bilateral peroneal sensory responses were normal. Bilateral peroneal, and tibial motor responses were normal. Bilateral tibial H reflexes were normal and symmetric.  NEEDLE ELECTROMYOGRAPHY: Selected needle examination was performed at right lower extremity muscles, and right lumbosacral paraspinal muscles.  Needle examination of right tibialis anterior, tibialis posterior, vastus lateralis, gluteus medius was normal.  There was no spontaneous activity at right L4-5 S1.  IMPRESSION: This is a normal study. There was no electrodiagnostic evidence of large fiber peripheral neuropathy, or right lumbosacral radiculopathy.   INTERPRETING PHYSICIAN:   Marcial Pacas M.D. Ph.D. [facility] [address], [place], [place] [address] [phone]

## 2014-02-26 NOTE — Progress Notes (Signed)
PATIENT: [name] DOB: 1931-06-28  HISTORICAL  SHAUNAE SIELOFF is a 81 yo RH referred by his primary care physician Dr. Tollie Pizza, for evaluation of bilateral feet paresthesia  She had a past medical history of hypothyroidism, hypertension, previous history of endometrial cancer, status post hysterectomy many years ago but does not require chemotherapy.  Since November 2015, she noticed bilateral distal toes numbness tingling, burning discomfort, difficulty sleeping, she has nocturia, no gait difficulty, no significant low back pain,  She denies neck pain, no bowel and bladder incontinence  Laboratory evaluation September 2015, vitamin B12 4 1 ESR 49, A1c 6.5, CBC WBC was 12.3, hemoglobin 13.3 CMP was normal, creatinine 1.0 TSH normal to.98  UPDATE Feb 16th 2016: She attends for electrodiagnostic study today, which showed no evidence of large fiber peripheral neuropathy, no evidence of right lumbosacral radiculopathy.  We have reviewed MRI of cervical spine: mild spondylosis of the cervical spine most notable at C5-6 where a disc bulge effaces the thecal sac without cord deformity  She denies significant neck pain, no gait difficulty, she has frequent nocturia, but no incontinence, she continues to have ascending paresthesia from bilateral feet to mid shin  in 2 months  REVIEW OF SYSTEMS: Full 14 system review of systems performed and notable only for toes numbness,   ALLERGIES: Allergies  Allergen Reactions  . Penicillins Rash    HOME MEDICATIONS: Current Outpatient Prescriptions on File Prior to Visit  Medication Sig Dispense Refill  . atenolol (TENORMIN) 25 MG tablet Take 25 mg by mouth daily.      Marland Kitchen levothyroxine (SYNTHROID, LEVOTHROID) 75 MCG tablet Take 75 mcg by mouth daily.       No current facility-administered medications on file prior to visit.    PAST MEDICAL HISTORY: Past Medical History  Diagnosis Date  . GERD (gastroesophageal reflux disease)     . Tubulovillous adenoma polyp of colon 04/1998  . Duodenal ulcer   . Diverticulosis   . Endometrial cancer   . Peripheral neuropathy     PAST SURGICAL HISTORY: Past Surgical History  Procedure Laterality Date  . Vaginal hysterectomy    . Foot surgery      Right  . Cholecystectomy    . Knee surgery      Right    FAMILY HISTORY: Family History  Problem Relation Age of Onset  . Colon cancer Neg Hx     SOCIAL HISTORY:  History   Social History  . Marital Status: Married    Spouse Name: N/A  . Number of Children: 3  . Years of Education: 12   Occupational History  . Retired    Social History Main Topics  . Smoking status: Never Smoker   . Smokeless tobacco: Never Used  . Alcohol Use: No  . Drug Use: No  . Sexual Activity: Not on file   Other Topics Concern  . Not on file   Social History Narrative   Lives at home alone.    Widowed.   Retired.   Right hand.   Three children.   High school education.   2 cups coffee daily.     PHYSICAL EXAM   There were no vitals filed for this visit.  Not recorded      There is no weight on file to calculate BMI.   Generalized: In no acute distress  Neck: Supple, no carotid bruits   Cardiac: Regular rate rhythm  Pulmonary: Clear to auscultation bilaterally  Musculoskeletal: No deformity  Neurological examination  Mentation: Alert oriented to time, place, history taking, and causual conversation  Cranial nerve II-XII: Pupils were equal round reactive to light. Extraocular movements were full.  Visual field were full on confrontational test. Bilateral fundi were sharp.  Facial sensation and strength were normal. Hearing was intact to finger rubbing bilaterally. Uvula tongue midline.  Head turning and shoulder shrug and were normal and symmetric.Tongue protrusion into cheek strength was normal.  Motor: Normal tone, bulk and strength.  Sensory: Intact to fine touch, pinprick, preserved vibratory sensation,  and proprioception at toes.  Coordination: Normal finger to nose, heel-to-shin bilaterally there was no truncal ataxia  Gait: Rising up from seated position without assistance, normal stance, without trunk ataxia, moderate stride, good arm swing, smooth turning, able to perform tiptoe, and heel walking without difficulty.   Romberg signs: Negative  Deep tendon reflexes: Brachioradialis 2/2, biceps 2/2, triceps 3/3, patellar 3/3,, Achilles 2/2, plantar responses were extensor bilaterally.   DIAGNOSTIC DATA (LABS, IMAGING, TESTING) - I reviewed patient records, labs, notes, testing and imaging myself where available.  No results found for: WBC, HGB, HCT, MCV, PLT    Component Value Date/Time   BUN 12 10/30/2010 1549   CREATININE 0.9 10/30/2010 1549   PROT 7.8 11/26/2010 1129   ALBUMIN 3.6 11/26/2010 1129   AST 20 11/26/2010 1129   ALT 12 11/26/2010 1129   ALKPHOS 56 11/26/2010 1129   BILITOT 0.4 11/26/2010 1129    ASSESSMENT AND PLAN  [place] H Hartzell is a 81 y.o. female  presenting with bilateral ptosis paresthesia, hyperreflexia on examinations, bilateral Babinski signs, there was no electrodiagnostic evidence of large fiber peripheral neuropathy, mild to moderate C5-6 cervical canal stenosis might explain her complaints, of subacute onset ascending paresthesia, but she denies gait difficulty, no incontinence, not a surgical candidate at this point, Continue follow-up in 6 months, call clinic for worsening symptoms,  Marcial Pacas, M.D. Ph.D.  [facility] [address], [place], [place] [address] [phone]

## 2014-03-25 ENCOUNTER — Other Ambulatory Visit (HOSPITAL_COMMUNITY): Payer: Self-pay | Admitting: Family Medicine

## 2014-03-25 DIAGNOSIS — Z1231 Encounter for screening mammogram for malignant neoplasm of breast: Secondary | ICD-10-CM

## 2014-04-09 ENCOUNTER — Encounter: Payer: Self-pay | Admitting: Gastroenterology

## 2014-04-15 ENCOUNTER — Ambulatory Visit (HOSPITAL_COMMUNITY)
Admission: RE | Admit: 2014-04-15 | Discharge: 2014-04-15 | Disposition: A | Discharge status: Home / Self Care | Payer: Medicare Other | Source: Ambulatory Visit | Attending: Family Medicine | Admitting: Family Medicine

## 2014-04-15 DIAGNOSIS — Z1231 Encounter for screening mammogram for malignant neoplasm of breast: Secondary | ICD-10-CM | POA: Diagnosis present

## 2014-07-19 ENCOUNTER — Encounter: Payer: Self-pay | Admitting: Gastroenterology

## 2014-08-27 ENCOUNTER — Ambulatory Visit: Payer: Medicare Other | Admitting: Neurology

## 2014-08-27 ENCOUNTER — Ambulatory Visit (INDEPENDENT_AMBULATORY_CARE_PROVIDER_SITE_OTHER): Payer: Medicare Other | Admitting: Neurology

## 2014-08-27 ENCOUNTER — Encounter: Payer: Self-pay | Admitting: Neurology

## 2014-08-27 VITALS — BP 135/61 | HR 58 | Ht 61.0 in | Wt 173.0 lb

## 2014-08-27 DIAGNOSIS — R292 Abnormal reflex: Secondary | ICD-10-CM

## 2014-08-27 DIAGNOSIS — R202 Paresthesia of skin: Secondary | ICD-10-CM | POA: Diagnosis not present

## 2014-08-27 NOTE — Progress Notes (Signed)
Chief Complaint  Patient presents with  . Peripheral Neuropathy    She is still having problems with pain, burning and tingling in her bilateral feet. She would like to further discuss her MRI and NCV/EMG results from February.      PATIENT: Kristina [name] DOB: 1931/05/26  HISTORICAL  Kristina [name] is a 81 yo RH referred by his primary care physician Dr. Tollie Pizza, for evaluation of bilateral feet paresthesia  She had a past medical history of hypothyroidism, hypertension, previous history of endometrial cancer, status post hysterectomy many years ago but does not require chemotherapy.  Since November 2015, she noticed bilateral distal toes numbness tingling, burning discomfort, difficulty sleeping, she has nocturia, no gait difficulty, no significant low back pain,  She denies neck pain, no bowel and bladder incontinence  Laboratory evaluation September 2015, vitamin B12 4 1 ESR 49, A1c 6.5, CBC WBC was 12.3, hemoglobin 13.3 CMP was normal, creatinine 1.0 TSH normal to.98  UPDATE August 16th 2016: Since initial visit in January 2016, she continue complains bilateral lower extremity paresthesia, starting from bilateral feet, traveling to bilateral knee level, mild bilateral fingertips paresthesia, she denies gait difficulty, she does has urinary urgency, she denies significant neck, or low back pain.  We have reviewed MRI of cervical spine in February 2016, multilevel degenerative disc disease, mild canal stenosis at C5-6, no cord signal changes, EMG nerve conduction study showed no evidence of large fiber peripheral neuropathy  REVIEW OF SYSTEMS: Full 14 system review of systems performed and notable only for toes numbness,  ALLERGIES: Allergies  Allergen Reactions  . Penicillins Rash    HOME MEDICATIONS: Current Outpatient Prescriptions on File Prior to Visit  Medication Sig Dispense Refill  . atenolol (TENORMIN) 25 MG tablet Take 25 mg by mouth daily.      Marland Kitchen  levothyroxine (SYNTHROID, LEVOTHROID) 75 MCG tablet Take 75 mcg by mouth daily.       No current facility-administered medications on file prior to visit.    PAST MEDICAL HISTORY: Past Medical History  Diagnosis Date  . GERD (gastroesophageal reflux disease)   . Tubulovillous adenoma polyp of colon 04/1998  . Duodenal ulcer   . Diverticulosis   . Endometrial cancer   . Peripheral neuropathy     PAST SURGICAL HISTORY: Past Surgical History  Procedure Laterality Date  . Vaginal hysterectomy    . Foot surgery      Right  . Cholecystectomy    . Knee surgery      Right    FAMILY HISTORY: Family History  Problem Relation Age of Onset  . Colon cancer Neg Hx     SOCIAL HISTORY:  Social History   Social History  . Marital Status: Married    Spouse Name: N/A  . Number of Children: 3  . Years of Education: 12   Occupational History  . Retired    Social History Main Topics  . Smoking status: Never Smoker   . Smokeless tobacco: Never Used  . Alcohol Use: No  . Drug Use: No  . Sexual Activity: Not on file   Other Topics Concern  . Not on file   Social History Narrative   Lives at home alone.    Widowed.   Retired.   Right hand.   Three children.   High school education.   2 cups coffee daily.     PHYSICAL EXAM   Filed Vitals:   08/27/14 1047  BP: 135/61  Pulse: 58  Height: 5'  1" (1.549 m)  Weight: 173 lb (78.472 kg)    Not recorded      Body mass index is 32.7 kg/(m^2).   Generalized: In no acute distress  Neck: Supple, no carotid bruits   Cardiac: Regular rate rhythm  Pulmonary: Clear to auscultation bilaterally  Musculoskeletal: No deformity  Neurological examination  Mentation: Alert oriented to time, place, history taking, and causual conversation  Cranial nerve II-XII: Pupils were equal round reactive to light. Extraocular movements were full.  Visual field were full on confrontational test. Bilateral fundi were sharp.  Facial  sensation and strength were normal. Hearing was intact to finger rubbing bilaterally. Uvula tongue midline.  Head turning and shoulder shrug and were normal and symmetric.Tongue protrusion into cheek strength was normal.  Motor: Normal tone, bulk and strength.  Sensory: Intact to fine touch, pinprick, preserved vibratory sensation, and proprioception at toes.  Coordination: Normal finger to nose, heel-to-shin bilaterally there was no truncal ataxia  Gait: Rising up from seated position without assistance, normal stance, without trunk ataxia, moderate stride, good arm swing, smooth turning, able to perform tiptoe, and heel walking without difficulty.   Romberg signs: Negative  Deep tendon reflexes: Brachioradialis 2/2, biceps 2/2, triceps 3/3, patellar 3/3,, Achilles 2/2, plantar responses were extensor bilaterally.   DIAGNOSTIC DATA (LABS, IMAGING, TESTING) - I reviewed patient records, labs, notes, testing and imaging myself where available.  No results found for: WBC, HGB, HCT, MCV, PLT    Component Value Date/Time   BUN 12 10/30/2010 1549   CREATININE 0.9 10/30/2010 1549   PROT 7.8 11/26/2010 1129   ALBUMIN 3.6 11/26/2010 1129   AST 20 11/26/2010 1129   ALT 12 11/26/2010 1129   ALKPHOS 56 11/26/2010 1129   BILITOT 0.4 11/26/2010 1129    ASSESSMENT Kristina [name] is a 81 y.o. female    Lower extremity paresthesia  hyperreflexia on examinations, bilateral Babinski signs  I have reviewed MRI of cervical with patient, multilevel degenerative disc disease, most severe at C5-6, with mild canal stenosis, no cord signal changes, the findings  likely explaining her complains of paresthesia, hyperreflexia,,  EMG nerve conduction study showed no evidence of peripheral neuropathy  Continue moderate exercise, only return to clinic for new issues  Kristina [name], M.D. Ph.D.  [facility] [address], [place], [place] [address] [phone]

## 2015-05-20 DIAGNOSIS — E034 Atrophy of thyroid (acquired): Secondary | ICD-10-CM | POA: Diagnosis not present

## 2015-05-20 DIAGNOSIS — K529 Noninfective gastroenteritis and colitis, unspecified: Secondary | ICD-10-CM | POA: Diagnosis not present

## 2015-05-20 DIAGNOSIS — R7301 Impaired fasting glucose: Secondary | ICD-10-CM | POA: Diagnosis not present

## 2015-05-20 DIAGNOSIS — I10 Essential (primary) hypertension: Secondary | ICD-10-CM | POA: Diagnosis not present

## 2015-05-20 DIAGNOSIS — E6609 Other obesity due to excess calories: Secondary | ICD-10-CM | POA: Diagnosis not present

## 2015-05-23 DIAGNOSIS — I10 Essential (primary) hypertension: Secondary | ICD-10-CM | POA: Diagnosis not present

## 2015-05-23 DIAGNOSIS — R7301 Impaired fasting glucose: Secondary | ICD-10-CM | POA: Diagnosis not present

## 2015-05-23 DIAGNOSIS — E039 Hypothyroidism, unspecified: Secondary | ICD-10-CM | POA: Diagnosis not present

## 2015-05-23 LAB — CBC AND DIFFERENTIAL
HCT: 40 % (ref 36–46)
HEMOGLOBIN: 13 g/dL (ref 12.0–16.0)
Neutrophils Absolute: 7 /uL
Platelets: 354 10*3/uL (ref 150–399)
WBC: 11.8 10^3/mL

## 2015-05-23 LAB — BASIC METABOLIC PANEL
Creatinine: 0.9 mg/dL (ref 0.5–1.1)
Glucose: 118 mg/dL

## 2015-05-23 LAB — LIPID PANEL
CHOLESTEROL: 194 mg/dL (ref 0–200)
HDL: 50 mg/dL (ref 35–70)
LDL CALC: 109 mg/dL
LDl/HDL Ratio: 3.9
TRIGLYCERIDES: 199 mg/dL — AB (ref 40–160)

## 2015-05-23 LAB — TSH: TSH: 2.01 u[IU]/mL (ref 0.41–5.90)

## 2015-05-23 LAB — HEMOGLOBIN A1C: Hemoglobin A1C: 6.2

## 2015-05-26 DIAGNOSIS — Z0000 Encounter for general adult medical examination without abnormal findings: Secondary | ICD-10-CM | POA: Diagnosis not present

## 2015-07-02 ENCOUNTER — Ambulatory Visit (INDEPENDENT_AMBULATORY_CARE_PROVIDER_SITE_OTHER): Payer: Medicare Other | Admitting: Gastroenterology

## 2015-07-02 ENCOUNTER — Encounter: Payer: Self-pay | Admitting: Gastroenterology

## 2015-07-02 VITALS — BP 122/66 | HR 60 | Ht 61.0 in | Wt 166.0 lb

## 2015-07-02 DIAGNOSIS — Z86010 Personal history of colonic polyps: Secondary | ICD-10-CM | POA: Diagnosis not present

## 2015-07-02 DIAGNOSIS — R197 Diarrhea, unspecified: Secondary | ICD-10-CM | POA: Diagnosis not present

## 2015-07-02 DIAGNOSIS — R1031 Right lower quadrant pain: Secondary | ICD-10-CM | POA: Diagnosis not present

## 2015-07-02 NOTE — Patient Instructions (Signed)
Take your Imodium 1-2 x daily long-term for diarrhea.   Normal BMI (Body Mass Index- based on height and weight) is between 23 and 30. Your BMI today is Body mass index is 31.38 kg/(m^2). Marland Kitchen Please consider follow up  regarding your BMI with your Primary Care Provider.   Thank you for choosing me and [facility].  Pricilla Riffle. Dagoberto Ligas., MD., Marval Regal

## 2015-07-02 NOTE — Progress Notes (Signed)
    History of Present Illness: This is a 82 year old female referred by Stephens Shire, MD for the evaluation of chronic diarrhea. She is accompanied by her daughter. She relates frequent right hip, right lower quadrant and right flank pain that worsens with movement. It does not change with meals or bowel movements. She had the same complaints at her office visit in 10/2010. Abdominal/pelvic CT scan in 11/2010 was unremarkable. She has a long history of diarrhea that began following cholecystectomy. She is not clear on how frequently she is taking Imodium. This medication is not included in her pillbox and appears that she's taking it as needed instead of on a regular scheduled basis. Denies weight loss, constipation, change in stool caliber, melena, hematochezia, nausea, vomiting, dysphagia, reflux symptoms, chest pain.  Review of Systems: Pertinent positive and negative review of systems were noted in the above HPI section. All other review of systems were otherwise negative.  Current Medications, Allergies, Past Medical History, Past Surgical History, Family History and Social History were reviewed in [employer] record.  Physical Exam: General: Well developed, well nourished, no acute distress Head: Normocephalic and atraumatic Eyes:  sclerae anicteric, EOMI Ears: Normal auditory acuity Mouth: No deformity or lesions Neck: Supple, no masses or thyromegaly Lungs: Clear throughout to auscultation Heart: Regular rate and rhythm; no murmurs, rubs or bruits Abdomen: Soft, non tender and non distended. No masses, hepatosplenomegaly or hernias noted. Normal Bowel sounds Musculoskeletal: Symmetrical with no gross deformities  Skin: No lesions on visible extremities Pulses:  Normal pulses noted Extremities: No clubbing, cyanosis, edema or deformities noted Neurological: Alert oriented x 4, grossly nonfocal Cervical Nodes:  No significant cervical adenopathy Inguinal  Nodes: No significant inguinal adenopathy Psychological:  Alert and cooperative. Normal mood and affect  Assessment and Recommendations:  1. Right sided abdominal pain, right hip pain and right flank pain. This does not appear to be a gastrointestinal problem. I suspect this is abdominal wall pain or musculoskeletal pain strain. Tylenol 2 tablets 3 times a day as needed.   2. Personal history of adenomatous colon polyps. Her last surveillance colonoscopy was performed in September 2015. Due to her age we will not plan for future screening or surveillance colonoscopies.  3. Postcholecystectomy diarrhea. Take loperamide once or twice a day on a regular, scheduled basis for long-term control of chronic diarrhea. We discussed placing this medication in her pillbox. If this regimen is not adequate to control her diarrhea she is advised to return for further follow-up  4. GERD and a history of a duodenal ulcer.    cc: Stephens Shire, MD [address] [place], [place] [address]

## 2015-08-12 DIAGNOSIS — M1711 Unilateral primary osteoarthritis, right knee: Secondary | ICD-10-CM | POA: Diagnosis not present

## 2015-09-22 ENCOUNTER — Telehealth: Payer: Self-pay

## 2015-09-22 NOTE — Telephone Encounter (Signed)
I spoke to pt's daughter, Maudie Mercury. She says that the pt and she will be at the appt tomorrow 9/12 at 9:00 with Dr. Krista Blue, weather permitting.

## 2015-09-23 ENCOUNTER — Ambulatory Visit (INDEPENDENT_AMBULATORY_CARE_PROVIDER_SITE_OTHER): Payer: Medicare Other | Admitting: Neurology

## 2015-09-23 ENCOUNTER — Other Ambulatory Visit: Payer: Self-pay | Admitting: Neurology

## 2015-09-23 ENCOUNTER — Encounter: Payer: Self-pay | Admitting: Neurology

## 2015-09-23 VITALS — BP 138/74 | HR 60 | Ht 61.0 in | Wt 171.0 lb

## 2015-09-23 DIAGNOSIS — F0390 Unspecified dementia without behavioral disturbance: Secondary | ICD-10-CM | POA: Insufficient documentation

## 2015-09-23 DIAGNOSIS — G3184 Mild cognitive impairment, so stated: Secondary | ICD-10-CM | POA: Diagnosis not present

## 2015-09-23 DIAGNOSIS — R292 Abnormal reflex: Secondary | ICD-10-CM

## 2015-09-23 DIAGNOSIS — R202 Paresthesia of skin: Secondary | ICD-10-CM

## 2015-09-23 NOTE — Progress Notes (Signed)
PATIENT: Kristina [name] DOB: 1931-10-26  Chief Complaint  Patient presents with  . Memory Loss    MMSE 27/30 - 7 animals.  She is here with her daughter, Kristina [name], to have her memory loss evaluated.  Her family feels her memory has been declining over the last few years.  She tends to be forgetful of driving directions and names.     HISTORICAL  Kristina [name] is 82 years old right-handed female, accompanied by her daughter Kristina [name],  seen in refer byher primary care physicians Dr.t Trilby Leaver for evaluation of memory loss on September 23 2015   I reviewed and summarized the referring note, she had a history of hypertension, impaired fasting glucose, A1c was 6.5, hypothyroidism, She also had a history of endometrial cancer in 2000, was treated with hysterectomy, but did not require chemotherapy or radiation therapy, also reported a history of duodenal ulcer many years ago, peripheral neuropathy, numbness in her toes for many years, I evaluate her in January 2016, EMG nerve conduction study then showed no large fiber peripheral neuropathy or lumbar sacral radiculopathy.   I have personally reviewed MRI cervical spine in February 2016, which showed noticeable cervical spondylosis at C5-6, with mild to moderate canal stenosis, but no cord deformity or signal change.   She has hyperreflexia on examination, she denies significant gait abnormality, no bowel and bladder incontinence, she does has occasionally diarrhea, urgency, she denies significant low back pain.     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  Her husband passed away in 2010/04/02.   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  She has bilateral knee pain, take  aleve every night, sleeps well, she was noted to have less appetite, lost some weight,  She still prepare meal every Sunday, cooking for 6 people.  She does realized that she forget things, names, misplace things, she tends to repeat questions,  She forgot what disease her husband died from. She still drives, but got lost sometimes.  Her mother does become forgetful in her 37s.    She did have lab evaluation 6 months ago. There was no significant abnormality found  REVIEW OF SYSTEMS: Full 14 system review of systems performed and notable only for snoring, diarrhea, memory loss, numbness, dizziness, snoring restless leg  ALLERGIES: Allergies  Allergen Reactions  . Penicillins Rash    HOME MEDICATIONS: Current Outpatient Prescriptions  Medication Sig Dispense Refill  . atenolol (TENORMIN) 25 MG tablet Take 25 mg by mouth daily.      Marland Kitchen levothyroxine (SYNTHROID, LEVOTHROID) 75 MCG tablet Take 75 mcg by mouth daily.      Marland Kitchen loperamide (IMODIUM) 2 MG capsule Take 2 mg by mouth as needed for diarrhea or loose stools.     No current facility-administered medications for this visit.     PAST MEDICAL HISTORY: Past Medical History:  Diagnosis Date  . Benign paroxysmal positional vertigo   . Diverticulosis   . Duodenal ulcer   . Endometrial cancer ([place])   . GERD (gastroesophageal reflux disease)   . Hypertension   . Hypothyroidism   . Memory loss   . Peripheral neuropathy ([employer] [facility])   . Tubulovillous adenoma  polyp of colon 04/1998    PAST SURGICAL HISTORY: Past Surgical History:  Procedure Laterality Date  . CATARACT EXTRACTION    . CHOLECYSTECTOMY    . FOOT SURGERY     Right  . KNEE SURGERY     Right  . VAGINAL HYSTERECTOMY      FAMILY HISTORY: Family History  Problem Relation Age of Onset  . Dementia Mother   . Heart attack Father   . Heart disease Sister   . Cancer Brother   . Colon cancer Neg Hx     SOCIAL HISTORY:  Social History   Social History  . Marital  status: Married    Spouse name: N/A  . Number of children: 3  . Years of education: 12   Occupational History  . Retired    Social History Main Topics  . Smoking status: Never Smoker  . Smokeless tobacco: Never Used  . Alcohol use No  . Drug use: No  . Sexual activity: Not on file   Other Topics Concern  . Not on file   Social History Narrative   Lives with her daughter, Kristina [name].   Widowed.   Retired.   Right hand.   Three children.   High school education.   2 cups coffee daily.     PHYSICAL EXAM   Vitals:   09/23/15 0856  BP: 138/74  Pulse: 60  Weight: 171 lb (77.6 kg)  Height: 5\' 1"  (1.549 m)    Not recorded      Body mass index is 32.31 kg/m.  PHYSICAL EXAMNIATION:  Gen: NAD, conversant, well nourised, obese, well groomed                     Cardiovascular: Regular rate rhythm, no peripheral edema, warm, nontender. Eyes: Conjunctivae clear without exudates or hemorrhage Neck: Supple, no carotid bruise. Pulmonary: Clear to auscultation bilaterally   NEUROLOGICAL EXAM:  MENTAL STATUS: Speech:    Speech is normal; fluent and spontaneous with normal comprehension.  Cognition:Mini-Mental Status Examination 27/30, animal naming 7     Orientation to time, place and person:      recent and remote memory: She missed a 3/3 recalls     Normal Attention span and concentration     Normal Language, naming, repeating,spontaneous speech     Fund of knowledge   CRANIAL NERVES: CN II: Visual fields are full to confrontation. Fundoscopic exam is normal with sharp discs and no vascular changes. Pupils are round equal and briskly reactive to light. CN III, IV, VI: extraocular movement are normal. No ptosis. CN V: Facial sensation is intact to pinprick in all 3 divisions bilaterally. Corneal responses are intact.  CN VII: Face is symmetric with normal eye closure and smile. CN VIII: Hearing is normal to rubbing fingers CN IX, X: Palate elevates symmetrically.  Phonation is normal. CN XI: Head turning and shoulder shrug are intact CN XII: Tongue is midline with normal movements and no atrophy.  MOTOR: There is no pronator drift of out-stretched arms. Muscle bulk and tone are normal. Muscle strength is normal.  REFLEXES: Reflexes are 2+ and symmetric at the biceps, triceps, knees, and ankles. Plantar responses are flexor.  SENSORY: Length dependent decreased to light touch, pinprick, and vibratory sensation at toes   COORDINATION: Rapid alternating movements and fine finger movements are intact. There is no dysmetria on finger-to-nose and heel-knee-shin.    GAIT/STANCE: Need to push up to get up from seated position, cautious, difficulty perform  tiptoe and tandem walking Romberg is absent.   DIAGNOSTIC DATA (LABS, IMAGING, TESTING) - I reviewed patient records, labs, notes, testing and imaging myself where available.   ASSESSMENT AND PLAN  Kristina [name] is a 82 y.o. female   Mild cognitive impairment  Mini-Mental Status Examination 27/30  Laboratory evaluations  MRI of brain  Encouraged her moderate exercise  Peripheral neuropathy  Laboratory evaluation for etiology   Kristina Pacas, M.D. Ph.D.  [facility] [address] [place], [place] [address] Ph: [phone] Fax: [phone]  [id number] Trilby Leaver, MD

## 2015-09-24 ENCOUNTER — Telehealth: Payer: Self-pay | Admitting: Neurology

## 2015-09-24 NOTE — Telephone Encounter (Signed)
Also, left Kim (dgt on HIPPA) a detailed message with lab results (ok per DPR).

## 2015-09-24 NOTE — Telephone Encounter (Addendum)
Spoke to patient - aware of lab results.  Denies any signs/symptoms of urinary and/or other infections.  Ok, per Dr. Krista Blue, to note.  Results also forwarded to PCP.

## 2015-09-24 NOTE — Telephone Encounter (Signed)
Please call patient laboratory showed mild elevated WBC, and C-reactive protein, rest of the laboratory evaluations were normal.  Please check to see if she has any signs of infection such as urinary urgency, burning, upper respiratory infection, fever

## 2015-09-25 LAB — TSH: TSH: 3.53 u[IU]/mL (ref 0.450–4.500)

## 2015-09-25 LAB — C-REACTIVE PROTEIN: CRP: 8.3 mg/L — ABNORMAL HIGH (ref 0.0–4.9)

## 2015-09-25 LAB — COMPREHENSIVE METABOLIC PANEL
A/G RATIO: 1.3 (ref 1.2–2.2)
ALT: 9 IU/L (ref 0–32)
AST: 17 IU/L (ref 0–40)
Albumin: 4.1 g/dL (ref 3.5–4.7)
Alkaline Phosphatase: 52 IU/L (ref 39–117)
BILIRUBIN TOTAL: 0.3 mg/dL (ref 0.0–1.2)
BUN/Creatinine Ratio: 12 (ref 12–28)
BUN: 11 mg/dL (ref 8–27)
CHLORIDE: 96 mmol/L (ref 96–106)
CO2: 28 mmol/L (ref 18–29)
Calcium: 9.9 mg/dL (ref 8.7–10.3)
Creatinine, Ser: 0.89 mg/dL (ref 0.57–1.00)
GFR calc Af Amer: 70 mL/min/{1.73_m2} (ref >59)
GFR calc non Af Amer: 61 mL/min/{1.73_m2} (ref >59)
Globulin, Total: 3.1 g/dL (ref 1.5–4.5)
Glucose: 99 mg/dL (ref 65–99)
POTASSIUM: 4.9 mmol/L (ref 3.5–5.2)
Sodium: 141 mmol/L (ref 134–144)
Total Protein: 7.2 g/dL (ref 6.0–8.5)

## 2015-09-25 LAB — CBC
Hematocrit: 40.1 % (ref 34.0–46.6)
Hemoglobin: 13.2 g/dL (ref 11.1–15.9)
MCH: 31.3 pg (ref 26.6–33.0)
MCHC: 32.9 g/dL (ref 31.5–35.7)
MCV: 95 fL (ref 79–97)
PLATELETS: 370 10*3/uL (ref 150–379)
RBC: 4.22 x10E6/uL (ref 3.77–5.28)
RDW: 14.2 % (ref 12.3–15.4)
WBC: 11.7 10*3/uL — AB (ref 3.4–10.8)

## 2015-09-25 LAB — VITAMIN B12: VITAMIN B 12: 383 pg/mL (ref 211–946)

## 2015-09-25 LAB — VITAMIN D 25 HYDROXY (VIT D DEFICIENCY, FRACTURES): VIT D 25 HYDROXY: 24.6 ng/mL — AB (ref 30.0–100.0)

## 2015-10-08 ENCOUNTER — Telehealth: Payer: Self-pay | Admitting: Neurology

## 2015-10-08 NOTE — Telephone Encounter (Signed)
Patient's daughter is calling regarding scheduling an MRI for the patient.

## 2015-10-09 NOTE — Telephone Encounter (Signed)
Spoke with patients daughter Joelene Millin to inform her that the MRI order has been to sent to [facility] gave her their number

## 2015-10-13 ENCOUNTER — Encounter: Payer: Self-pay | Admitting: General Practice

## 2015-10-18 ENCOUNTER — Ambulatory Visit
Admission: RE | Admit: 2015-10-18 | Discharge: 2015-10-18 | Disposition: A | Discharge status: Home / Self Care | Payer: Medicare Other | Source: Ambulatory Visit | Attending: Neurology | Admitting: Neurology

## 2015-10-18 DIAGNOSIS — R202 Paresthesia of skin: Secondary | ICD-10-CM

## 2015-10-18 DIAGNOSIS — R292 Abnormal reflex: Secondary | ICD-10-CM

## 2015-10-18 DIAGNOSIS — G3184 Mild cognitive impairment, so stated: Secondary | ICD-10-CM

## 2015-10-20 ENCOUNTER — Telehealth: Payer: Self-pay | Admitting: *Deleted

## 2015-10-20 ENCOUNTER — Other Ambulatory Visit: Payer: Medicare Other

## 2015-10-20 NOTE — Telephone Encounter (Signed)
Left message for a return call

## 2015-10-20 NOTE — Telephone Encounter (Signed)
-----   Message from Marcial Pacas, MD sent at 10/20/2015  2:12 PM EDT ----- Please call patient MRI of the brain showed age related changes, evidence of cortical atrophy, small vessel disease, chronic sinusitis, I will go over imaging findings with her at next follow-up visit.

## 2015-10-21 NOTE — Telephone Encounter (Signed)
Spoke to [employer] on HIPAA) - she is aware of results and will keep her pending appt for further discussion.

## 2015-10-21 NOTE — Telephone Encounter (Signed)
Pt's daughter returned call. [phone]

## 2015-10-22 DIAGNOSIS — Z23 Encounter for immunization: Secondary | ICD-10-CM | POA: Diagnosis not present

## 2015-10-23 ENCOUNTER — Encounter: Payer: Self-pay | Admitting: Neurology

## 2015-10-23 ENCOUNTER — Ambulatory Visit (INDEPENDENT_AMBULATORY_CARE_PROVIDER_SITE_OTHER): Payer: Medicare Other | Admitting: Neurology

## 2015-10-23 VITALS — BP 126/62 | HR 68 | Resp 18 | Ht 61.0 in | Wt 171.5 lb

## 2015-10-23 DIAGNOSIS — G3184 Mild cognitive impairment, so stated: Secondary | ICD-10-CM | POA: Diagnosis not present

## 2015-10-23 MED ORDER — MEMANTINE HCL 10 MG PO TABS
10.0000 mg | ORAL_TABLET | Freq: Two times a day (BID) | ORAL | 11 refills | Status: DC
Start: 2015-10-23 — End: 2015-12-16

## 2015-10-23 NOTE — Progress Notes (Signed)
PATIENT: Kristina [name] DOB: 12-03-1931  Chief Complaint  Patient presents with  . Memory Loss    Here to discuss MRI and lab results.  MMSE on 09-23-15 was 27/30, 7 animals.  WBC's were some elevated at last ov.  Pt. denies any signs of infection/fim     [name] is 82 years old right-handed female, accompanied by her daughter Kristina [name],  seen in refer byher primary care physicians Dr. Tollie Pizza for evaluation of memory loss on September 23 2015   I reviewed and summarized the referring note, she had a history of hypertension, impaired fasting glucose, A1c was 6.5, hypothyroidism, She also had a history of endometrial cancer in 2000, was treated with hysterectomy, but did not require chemotherapy or radiation therapy, also reported a history of duodenal ulcer many years ago, peripheral neuropathy, numbness in her toes for many years, I evaluate her in January 2016, EMG nerve conduction study then showed no large fiber peripheral neuropathy or lumbar sacral radiculopathy.   I have personally reviewed MRI cervical spine in February 2016, which showed noticeable cervical spondylosis at C5-6, with mild to moderate canal stenosis, but no cord deformity or signal change.   She has hyperreflexia on examination, she denies significant gait abnormality, no bowel and bladder incontinence, she does has occasionally diarrhea, urgency, she denies significant low back pain.     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  Her husband passed away in 2010/04/11.   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  She has bilateral knee pain, take aleve every night, sleeps well, she was noted to have less appetite, lost some  weight,  She still prepare meal every Sunday, cooking for 6 people.  She does realized that she forget things, names, misplace things, she tends to repeat questions,  She forgot what disease her husband died from. She still drives, but got lost sometimes.  Her mother does become forgetful in her 14s.    She did have lab evaluation 6 months ago. There was no significant abnormality found  UPDATE Oct 23 2015: We have personally reviewed MRI of brain without contrast in October 2017, generalized atrophy, especially at the perisyalvian fissure region, mild small vessel disease, chronic left maxillary sphenoid sinusitis  She likes to read,but sedentary lifestyle  Laboratory evaluation showed normal TSH, B12, CMP,mild elevated C reactive protein, WBC 11 point 7,  REVIEW OF SYSTEMS: Full 14 system review of systems performed and notable only for Above ALLERGIES: Allergies  Allergen Reactions  . Penicillins Rash    HOME MEDICATIONS: Current Outpatient Prescriptions  Medication Sig Dispense Refill  . atenolol (TENORMIN) 25 MG tablet Take 25 mg by mouth daily.      Marland Kitchen levothyroxine (SYNTHROID, LEVOTHROID) 75 MCG tablet Take 75 mcg by mouth daily.      Marland Kitchen loperamide (IMODIUM) 2 MG capsule Take 2 mg by mouth as needed for diarrhea or loose stools.     No current facility-administered medications for this visit.     PAST MEDICAL HISTORY: Past Medical History:  Diagnosis Date  . Benign paroxysmal positional vertigo   . Diverticulosis   . Duodenal ulcer   . Endometrial  cancer ([place])   . GERD (gastroesophageal reflux disease)   . Hypertension   . Hypothyroidism   . Memory loss   . Peripheral neuropathy ([place])   . Tubulovillous adenoma polyp of colon 04/1998    PAST SURGICAL HISTORY: Past Surgical History:  Procedure Laterality Date  . CATARACT EXTRACTION    . CHOLECYSTECTOMY    . FOOT SURGERY     Right  . KNEE SURGERY     Right  . VAGINAL HYSTERECTOMY      FAMILY  HISTORY: Family History  Problem Relation Age of Onset  . Dementia Mother   . Heart attack Father   . Heart disease Sister   . Cancer Brother   . Colon cancer Neg Hx     SOCIAL HISTORY:  Social History   Social History  . Marital status: Married    Spouse name: N/A  . Number of children: 3  . Years of education: 12   Occupational History  . Retired    Social History Main Topics  . Smoking status: Never Smoker  . Smokeless tobacco: Never Used  . Alcohol use No  . Drug use: No  . Sexual activity: Not on file   Other Topics Concern  . Not on file   Social History Narrative   Lives with her daughter, Kristina [name].   Widowed.   Retired.   Right hand.   Three children.   High school education.   2 cups coffee daily.     PHYSICAL EXAM   Vitals:   10/23/15 0754  BP: 126/62  Pulse: 68  Resp: 18  Weight: 171 lb 8 oz (77.8 kg)  Height: 5\' 1"  (1.549 m)    Not recorded      Body mass index is 32.4 kg/m.  PHYSICAL EXAMNIATION:  Gen: NAD, conversant, well nourised, obese, well groomed                     Cardiovascular: Regular rate rhythm, no peripheral edema, warm, nontender. Eyes: Conjunctivae clear without exudates or hemorrhage Neck: Supple, no carotid bruise. Pulmonary: Clear to auscultation bilaterally   NEUROLOGICAL EXAM:  MENTAL STATUS: Speech:    Speech is normal; fluent and spontaneous with normal comprehension.  Cognition:Mini-Mental Status Examination 27/30, animal naming 7     Orientation to time, place and person:      recent and remote memory: She missed a 3/3 recalls     Normal Attention span and concentration     Normal Language, naming, repeating,spontaneous speech     Fund of knowledge   CRANIAL NERVES: CN II: Visual fields are full to confrontation. Fundoscopic exam is normal with sharp discs and no vascular changes. Pupils are round equal and briskly reactive to light. CN III, IV, VI: extraocular movement are normal. No ptosis. CN  V: Facial sensation is intact to pinprick in all 3 divisions bilaterally. Corneal responses are intact.  CN VII: Face is symmetric with normal eye closure and smile. CN VIII: Hearing is normal to rubbing fingers CN IX, X: Palate elevates symmetrically. Phonation is normal. CN XI: Head turning and shoulder shrug are intact CN XII: Tongue is midline with normal movements and no atrophy.  MOTOR: There is no pronator drift of out-stretched arms. Muscle bulk and tone are normal. Muscle strength is normal.  REFLEXES: Reflexes are 2+ and symmetric at the biceps, triceps, knees, and ankles. Plantar responses are flexor.  SENSORY: Length dependent decreased to light touch, pinprick, and vibratory sensation at  toes   COORDINATION: Rapid alternating movements and fine finger movements are intact. There is no dysmetria on finger-to-nose and heel-knee-shin.    GAIT/STANCE: Need to push up to get up from seated position, cautious, difficulty perform tiptoe and tandem walking Romberg is absent.   DIAGNOSTIC DATA (LABS, IMAGING, TESTING) - I reviewed patient records, labs, notes, testing and imaging myself where available.   ASSESSMENT AND PLAN  Kristina [name] is a 82 y.o. female   Mild cognitive impairment  Started Namenda10 mg twice a day  Encouraged her moderate exercise  Return to clinic in 6 months   Marcial Pacas, M.D. Ph.D.  [facility] [address] [place], [place] [address] Ph: [phone] Fax: [phone]  [id number] Trilby Leaver, MD

## 2015-11-04 NOTE — Telephone Encounter (Signed)
Patient had MR Brain wo contrast at [facility] imaging on 10/18/15.

## 2015-12-16 ENCOUNTER — Other Ambulatory Visit: Payer: Self-pay | Admitting: *Deleted

## 2015-12-16 MED ORDER — MEMANTINE HCL 10 MG PO TABS: 10.0000 mg | ORAL_TABLET | Freq: Two times a day (BID) | ORAL | 3 refills | Status: DC

## 2016-02-05 ENCOUNTER — Ambulatory Visit (INDEPENDENT_AMBULATORY_CARE_PROVIDER_SITE_OTHER): Payer: Medicare Other | Admitting: Family Medicine

## 2016-02-05 ENCOUNTER — Encounter: Payer: Self-pay | Admitting: Family Medicine

## 2016-02-05 VITALS — BP 124/61 | HR 62 | Temp 97.9°F | Resp 17 | Ht 61.0 in | Wt 174.1 lb

## 2016-02-05 DIAGNOSIS — I10 Essential (primary) hypertension: Secondary | ICD-10-CM | POA: Diagnosis not present

## 2016-02-05 DIAGNOSIS — G3184 Mild cognitive impairment, so stated: Secondary | ICD-10-CM

## 2016-02-05 DIAGNOSIS — E039 Hypothyroidism, unspecified: Secondary | ICD-10-CM

## 2016-02-05 DIAGNOSIS — Z1231 Encounter for screening mammogram for malignant neoplasm of breast: Secondary | ICD-10-CM

## 2016-02-05 LAB — CBC WITH DIFFERENTIAL/PLATELET
BASOS ABS: 0.1 10*3/uL (ref 0.0–0.1)
Basophils Relative: 0.7 % (ref 0.0–3.0)
Eosinophils Absolute: 0.2 10*3/uL (ref 0.0–0.7)
Eosinophils Relative: 1.6 % (ref 0.0–5.0)
HEMATOCRIT: 37.4 % (ref 36.0–46.0)
HEMOGLOBIN: 13 g/dL (ref 12.0–15.0)
LYMPHS PCT: 39.5 % (ref 12.0–46.0)
Lymphs Abs: 4.5 10*3/uL — ABNORMAL HIGH (ref 0.7–4.0)
MCHC: 34.7 g/dL (ref 30.0–36.0)
MCV: 93.5 fl (ref 78.0–100.0)
MONOS PCT: 6 % (ref 3.0–12.0)
Monocytes Absolute: 0.7 10*3/uL (ref 0.1–1.0)
NEUTROS ABS: 6 10*3/uL (ref 1.4–7.7)
Neutrophils Relative %: 52.2 % (ref 43.0–77.0)
PLATELETS: 406 10*3/uL — AB (ref 150.0–400.0)
RBC: 4 Mil/uL (ref 3.87–5.11)
RDW: 13.8 % (ref 11.5–15.5)
WBC: 11.5 10*3/uL — AB (ref 4.0–10.5)

## 2016-02-05 LAB — HEPATIC FUNCTION PANEL
ALT: 16 U/L (ref 0–35)
AST: 15 U/L (ref 0–37)
Albumin: 3.6 g/dL (ref 3.5–5.2)
Alkaline Phosphatase: 51 U/L (ref 39–117)
BILIRUBIN DIRECT: 0.1 mg/dL (ref 0.0–0.3)
BILIRUBIN TOTAL: 0.2 mg/dL (ref 0.2–1.2)
TOTAL PROTEIN: 6.7 g/dL (ref 6.0–8.3)

## 2016-02-05 LAB — LIPID PANEL
Cholesterol: 168 mg/dL (ref 0–200)
HDL: 43 mg/dL (ref >39.00)
NONHDL: 125.35
Total CHOL/HDL Ratio: 4
Triglycerides: 239 mg/dL — ABNORMAL HIGH (ref 0.0–149.0)
VLDL: 47.8 mg/dL — AB (ref 0.0–40.0)

## 2016-02-05 LAB — BASIC METABOLIC PANEL
BUN: 12 mg/dL (ref 6–23)
CALCIUM: 8.7 mg/dL (ref 8.4–10.5)
CO2: 27 mEq/L (ref 19–32)
Chloride: 100 mEq/L (ref 96–112)
Creatinine, Ser: 0.96 mg/dL (ref 0.40–1.20)
GFR: 59.11 mL/min — AB (ref >60.00)
GLUCOSE: 141 mg/dL — AB (ref 70–99)
Potassium: 4.6 mEq/L (ref 3.5–5.1)
SODIUM: 138 meq/L (ref 135–145)

## 2016-02-05 LAB — TSH: TSH: 3.76 u[IU]/mL (ref 0.35–4.50)

## 2016-02-05 LAB — LDL CHOLESTEROL, DIRECT: Direct LDL: 91 mg/dL

## 2016-02-05 NOTE — Patient Instructions (Signed)
Schedule your complete physical in 6 months We'll notify you of your lab results and make any changes if needed We'll call you with your mammogram appt Continue to make healthy food choices and get regular exercise- you look great! Call with any questions or concerns Welcome!  We're glad to have you!!!

## 2016-02-05 NOTE — Progress Notes (Signed)
   Subjective:    Patient ID: Kristina [name], female    DOB: [birth year], 83 y.o.   MRN: [id number]  HPI New to establish.  Previous MD- Dr Tollie Pizza.  Neuro- Krista Blue  Hypothyroid- ongoing issue for pt.  Taking Levothyroxine 78mcg daily.  No changes to skin/hair/nails.  Pt is frequently chilled.  Denies excessive fatigue.  HTN- chronic problem, on Atenolol daily w/ good control.  No CP, SOB, HAs, visual changes, edema.  Mild dementia- chronic problem, following w/ Dr Krista Blue.  On Namenda.  Due for [place] see HPI     Objective:   Physical Exam  Constitutional: She is oriented to person, place, and time. She appears well-developed and well-nourished. No distress.  HENT:  Head: Normocephalic and atraumatic.  Eyes: Conjunctivae and EOM are normal. Pupils are equal, round, and reactive to light.  Neck: Normal range of motion. Neck supple. No thyromegaly present.  Cardiovascular: Normal rate, regular rhythm, normal heart sounds and intact distal pulses.   No murmur heard. Pulmonary/Chest: Effort normal and breath sounds normal. No respiratory distress.  Abdominal: Soft. She exhibits no distension. There is no tenderness.  Musculoskeletal: She exhibits no edema.  Lymphadenopathy:    She has no cervical adenopathy.  Neurological: She is alert and oriented to person, place, and time.  Mild pill rolling on R  Skin: Skin is warm and dry.  Psychiatric: She has a normal mood and affect. Her behavior is normal.  Vitals reviewed.         Assessment & Plan:

## 2016-02-05 NOTE — Progress Notes (Signed)
Pre visit review using our clinic review tool, if applicable. No additional management support is needed unless otherwise documented below in the visit note. 

## 2016-02-06 ENCOUNTER — Other Ambulatory Visit (INDEPENDENT_AMBULATORY_CARE_PROVIDER_SITE_OTHER): Payer: Medicare Other

## 2016-02-06 DIAGNOSIS — R7309 Other abnormal glucose: Secondary | ICD-10-CM

## 2016-02-06 LAB — HEMOGLOBIN A1C: HEMOGLOBIN A1C: 6.3 % (ref 4.6–6.5)

## 2016-02-08 NOTE — Assessment & Plan Note (Signed)
New to provider, ongoing for pt.  On Atenolol w/ good control and asymptomatic at this time.  Check labs.  No anticipated med changes.  Will follow.

## 2016-02-08 NOTE — Assessment & Plan Note (Signed)
New to provider, ongoing for pt.  Reports being frequently chilled.  Check labs.  Adjust meds prn

## 2016-02-08 NOTE — Assessment & Plan Note (Signed)
New to provider, ongoing for pt.  Following w/ Dr Krista Blue (neurology) and on [place].  I also noticed a mild pill rolling tremor on R.  Will defer any additional work up or medication to neurology.  Will follow along and assist as able.

## 2016-02-09 ENCOUNTER — Encounter: Payer: Self-pay | Admitting: General Practice

## 2016-02-24 ENCOUNTER — Other Ambulatory Visit: Payer: Self-pay | Admitting: Family Medicine

## 2016-03-26 ENCOUNTER — Telehealth: Payer: Self-pay | Admitting: Family Medicine

## 2016-03-26 MED ORDER — LEVOTHYROXINE SODIUM 75 MCG PO TABS: 75.0000 ug | ORAL_TABLET | Freq: Every day | ORAL | 1 refills | Status: DC

## 2016-03-26 NOTE — Telephone Encounter (Signed)
Requesting refill of levothyroxine (SYNTHROID, LEVOTHROID) 75 MCG tablet.  Pharmacy: [id number] - [place], [place] - [address] [phone] (Phone) [phone] (Fax)

## 2016-03-26 NOTE — Telephone Encounter (Signed)
Medication filled to pharmacy as requested.   

## 2016-03-29 ENCOUNTER — Telehealth: Payer: Self-pay | Admitting: Family Medicine

## 2016-03-29 NOTE — Telephone Encounter (Signed)
Disregard, pt already has refill she was requesting.

## 2016-04-08 ENCOUNTER — Other Ambulatory Visit: Payer: Self-pay | Admitting: Family Medicine

## 2016-04-08 DIAGNOSIS — Z1231 Encounter for screening mammogram for malignant neoplasm of breast: Secondary | ICD-10-CM

## 2016-04-19 ENCOUNTER — Telehealth: Payer: Self-pay | Admitting: Family Medicine

## 2016-04-19 MED ORDER — ATENOLOL 25 MG PO TABS: 25.0000 mg | ORAL_TABLET | Freq: Every day | ORAL | 6 refills | Status: DC

## 2016-04-19 NOTE — Telephone Encounter (Signed)
Patient requesting refill of atenolol (TENORMIN) 25 MG tablet.  Pharmacy:  [id number] - [place], [place] - [address] [phone] (Phone) [phone] (Fax)

## 2016-04-19 NOTE — Telephone Encounter (Signed)
Medication filled to pharmacy as requested.   

## 2016-04-23 ENCOUNTER — Encounter: Payer: Self-pay | Admitting: Nurse Practitioner

## 2016-04-23 ENCOUNTER — Ambulatory Visit (INDEPENDENT_AMBULATORY_CARE_PROVIDER_SITE_OTHER): Payer: Medicare Other | Admitting: Nurse Practitioner

## 2016-04-23 ENCOUNTER — Encounter (INDEPENDENT_AMBULATORY_CARE_PROVIDER_SITE_OTHER): Payer: Self-pay

## 2016-04-23 VITALS — BP 139/59 | HR 56 | Wt 174.0 lb

## 2016-04-23 DIAGNOSIS — R202 Paresthesia of skin: Secondary | ICD-10-CM

## 2016-04-23 DIAGNOSIS — G3184 Mild cognitive impairment, so stated: Secondary | ICD-10-CM

## 2016-04-23 DIAGNOSIS — R269 Unspecified abnormalities of gait and mobility: Secondary | ICD-10-CM | POA: Diagnosis not present

## 2016-04-23 NOTE — Patient Instructions (Addendum)
Continue  Namenda10 mg twice a day  Encouraged her moderate exercise Strategy exercises such as cross words, solitaire word search etc. Return to clinic in 6 months

## 2016-04-23 NOTE — Progress Notes (Signed)
[facility]  PATIENT: Kristina [name] DOB: 05-11-1931   REASON FOR VISIT: Follow-up for memory loss HISTORY FROM: Patient and son     HISTORY OF PRESENT ILLNESS: HISTORY Kristina [name] is 83 years old right-handed female, accompanied by her daughter Kristina [name],  seen in refer byher primary care physicians Dr. Tollie Pizza for evaluation of memory loss on September 23 2015   I reviewed and summarized the referring note, she had a history of hypertension, impaired fasting glucose, A1c was 6.5, hypothyroidism, She also had a history of endometrial cancer in 1998/05/01, was treated with hysterectomy, but did not require chemotherapy or radiation therapy, also reported a history of duodenal ulcer many years ago, peripheral neuropathy, numbness in her toes for many years, I evaluate her in January 2016, EMG nerve conduction study then showed no large fiber peripheral neuropathy or lumbar sacral radiculopathy.   I have personally reviewed MRI cervical spine in February 2016, which showed noticeable cervical spondylosis at C5-6, with mild to moderate canal stenosis, but no cord deformity or signal change.   She has hyperreflexia on examination, she denies significant gait abnormality, no bowel and bladder incontinence, she does has occasionally diarrhea, urgency, she denies significant low back pain.     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  Her husband passed away in May 01, 2010.   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  She has bilateral knee pain, take aleve every night, sleeps well, she was noted to have less appetite, lost some weight,  She still prepare meal every Sunday, cooking for 6 people.  She  does realized that she forget things, names, misplace things, she tends to repeat questions,  She forgot what disease her husband died from. She still drives, but got lost sometimes.  Her mother does become forgetful in her 26s.    She did have lab evaluation 6 months ago. There was no significant abnormality found  UPDATE Oct 23 2015:YY We have personally reviewed MRI of brain without contrast in October 2017, generalized atrophy, especially at the perisyalvian fissure region, mild small vessel disease, chronic left maxillary sphenoid sinusitis She likes to read,but sedentary lifestyle Laboratory evaluation showed normal TSH, B12, CMP,mild elevated C reactive protein, WBC 11 point 7, UPDATE 04/13/2018[name], 83 year old female returns for follow-up. She has a history of memory loss was placed on Namenda at her last visit. She denies any side effects to the medication. She thinks her memory is stable. She continues to be independent in activities of daily living except that her daughter does her finances. She continues to drive without getting lost. She does not exercise. She denies any falls or balance issues . No safety issues identified. She returns for reevaluation   REVIEW OF SYSTEMS: Full 14 system review of systems performed and notable only for those listed, all others are neg:  Constitutional: neg  Cardiovascular: neg Ear/Nose/Throat: neg  Skin: neg Eyes: neg Respiratory: neg Gastroitestinal: neg  Hematology/Lymphatic: neg  Endocrine: neg Musculoskeletal: Joint pain Allergy/Immunology: neg Neurological: Memory loss Psychiatric: neg Sleep : neg   ALLERGIES: Allergies  Allergen Reactions  . Penicillins Rash    HOME MEDICATIONS: Outpatient Medications Prior to Visit  Medication Sig Dispense Refill  . atenolol (TENORMIN) 25 MG tablet Take 1 tablet (25 mg total) by mouth daily. 30 tablet 6  . levothyroxine (SYNTHROID, LEVOTHROID) 75 MCG tablet Take 1 tablet  (75 mcg total) by mouth daily. 90 tablet 1  . loperamide (IMODIUM) 2 MG capsule Take 2 mg by mouth as needed for diarrhea or loose stools.    . memantine (NAMENDA) 10 MG tablet Take 1 tablet (10 mg total) by mouth 2 (two) times daily. 180 tablet 3  . naproxen sodium (ANAPROX) 220 MG tablet Take 220 mg by mouth daily.      No facility-administered medications prior to visit.     PAST MEDICAL HISTORY: Past Medical History:  Diagnosis Date  . Benign paroxysmal positional vertigo   . Diverticulosis   . Duodenal ulcer   . Endometrial cancer ([place])   . GERD (gastroesophageal reflux disease)   . Hypertension   . Hypothyroidism   . Memory loss   . Peripheral neuropathy ([place])   . Tubulovillous adenoma polyp of colon 04/1998    PAST SURGICAL HISTORY: Past Surgical History:  Procedure Laterality Date  . CATARACT EXTRACTION    . CHOLECYSTECTOMY    . FOOT SURGERY     Right  . KNEE SURGERY     Right  . VAGINAL HYSTERECTOMY      FAMILY HISTORY: Family History  Problem Relation Age of Onset  . Dementia Mother   . Heart attack Father   . Heart disease Sister   . Cancer Brother   . Colon cancer Neg Hx     SOCIAL HISTORY: Social History   Social History  . Marital status: Married    Spouse name: N/A  . Number of children: 3  . Years of education: 12   Occupational History  . Retired    Social History Main Topics  . Smoking status: Never Smoker  . Smokeless tobacco: Never Used  . Alcohol use No  . Drug use: No  . Sexual activity: Not on file   Other Topics Concern  . Not on file   Social History Narrative   Lives with her daughter, Kristina [name].   Widowed.   Retired.   Right hand.   Three children.   High school education.   2 cups coffee daily.     PHYSICAL EXAM  Vitals:   04/23/16 0801  BP: (!) 139/59  Pulse: (!) 56  Weight: 174 lb (78.9 kg)   Body mass index is 32.88 kg/m.  Generalized: Well developed, obese female in no acute distress ,  well-groomed Head: normocephalic and atraumatic,. Oropharynx benign  Neck: Supple, no carotid bruits  Cardiac: Regular rate rhythm, no murmur  Musculoskeletal: No deformity   Neurological examination   Mentation: Alert Follows all commands speech and language fluent. AFT 5. Clock drawing 3/4.   MMSE - Mini Mental State Exam 04/23/2016 09/23/2015  Orientation to time 3 5  Orientation to Place 5 5  Registration 3 3  Attention/ Calculation 5 5  Recall 1 0  Language- name 2 objects 2 2  Language- repeat 0 1  Language- follow 3 step command 3 3  Language- read & follow direction 1 1  Write a sentence 1 1  Copy design 1 1  Total score 25 27  Cranial nerve II-XII: Fundoscopic exam reveals sharp disc margins.Pupils were equal round reactive to light extraocular movements were full, visual field were full on confrontational test. Facial sensation and strength were normal. hearing was intact  to finger rubbing bilaterally. Uvula tongue midline. head turning and shoulder shrug were normal and symmetric.Tongue protrusion into cheek strength was normal. Motor: normal bulk and tone, full strength in the BUE, BLE, fine finger movements normal, no pronator drift. No focal weakness Sensory: Length dependent decreased light touch pinprick and vibratory sensation at toes  Coordination: finger-nose-finger, heel-to-shin bilaterally, no dysmetria Reflexes: Brachioradialis 2/2, biceps 2/2, triceps 2/2, patellar 2/2, Achilles 2/2, plantar responses were flexor bilaterally. Gait and Station: Rising up from seated position with push off cautious gait no assistive device. Can heel and toe walk Tandem gait is unsteady  DIAGNOSTIC DATA (LABS, IMAGING, TESTING) - I reviewed patient records, labs, notes, testing and imaging myself where available.  Lab Results  Component Value Date   WBC 11.5 (H) 02/05/2016   HGB 13.0 02/05/2016   HCT 37.4 02/05/2016   MCV 93.5 02/05/2016   PLT 406.0 (H) 02/05/2016       Component Value Date/Time   NA 138 02/05/2016 1509   NA 141 09/23/2015 0959   K 4.6 02/05/2016 1509   CL 100 02/05/2016 1509   CO2 27 02/05/2016 1509   GLUCOSE 141 (H) 02/05/2016 1509   BUN 12 02/05/2016 1509   BUN 11 09/23/2015 0959   CREATININE 0.96 02/05/2016 1509   CALCIUM 8.7 02/05/2016 1509   PROT 6.7 02/05/2016 1509   PROT 7.2 09/23/2015 0959   ALBUMIN 3.6 02/05/2016 1509   ALBUMIN 4.1 09/23/2015 0959   AST 15 02/05/2016 1509   ALT 16 02/05/2016 1509   ALKPHOS 51 02/05/2016 1509   BILITOT 0.2 02/05/2016 1509   BILITOT 0.3 09/23/2015 0959   GFRNONAA 61 09/23/2015 0959   GFRAA 70 09/23/2015 0959   Lab Results  Component Value Date   CHOL 168 02/05/2016   HDL 43.00 02/05/2016   LDLCALC 109 05/23/2015   LDLDIRECT 91.0 02/05/2016   TRIG 239.0 (H) 02/05/2016   CHOLHDL 4 02/05/2016   Lab Results  Component Value Date   HGBA1C 6.3 02/06/2016   Lab Results  Component Value Date   VITAMINB12 383 09/23/2015   Lab Results  Component Value Date   TSH 3.76 02/05/2016      ASSESSMENT AND PLAN  83 y.o. year old female  has a past medical history of  Memory loss; Peripheral neuropathy ([place]); here to follow-up for memory loss   PLAN: Continue  Namenda10 mg twice a day  Encouraged her moderate exercise Strategy exercises such as cross words, strategy games solitaire, word search etc. Return to clinic in 6 months I spent 25 min  in total face to face time with the patient and son more than 50% of which was spent counseling and coordination of care, reviewing test results reviewing medications and discussing and reviewing the diagnosis of memory loss and further treatment options.. Please continue to monitor for any safety issues in the home , Dennie Bible, [facility], [facility], APRN  [facility] [address], [place], [place] [address] [phone]

## 2016-04-28 NOTE — Progress Notes (Signed)
I have reviewed and agreed above plan. 

## 2016-05-04 ENCOUNTER — Ambulatory Visit
Admission: RE | Admit: 2016-05-04 | Discharge: 2016-05-04 | Disposition: A | Discharge status: Home / Self Care | Payer: Medicare Other | Source: Ambulatory Visit | Attending: Family Medicine | Admitting: Family Medicine

## 2016-05-04 DIAGNOSIS — Z1231 Encounter for screening mammogram for malignant neoplasm of breast: Secondary | ICD-10-CM | POA: Diagnosis not present

## 2016-05-05 ENCOUNTER — Other Ambulatory Visit: Payer: Self-pay | Admitting: Family Medicine

## 2016-05-05 DIAGNOSIS — R928 Other abnormal and inconclusive findings on diagnostic imaging of breast: Secondary | ICD-10-CM

## 2016-05-07 ENCOUNTER — Ambulatory Visit
Admission: RE | Admit: 2016-05-07 | Discharge: 2016-05-07 | Disposition: A | Discharge status: Home / Self Care | Payer: Medicare Other | Source: Ambulatory Visit | Attending: Family Medicine | Admitting: Family Medicine

## 2016-05-07 DIAGNOSIS — R921 Mammographic calcification found on diagnostic imaging of breast: Secondary | ICD-10-CM | POA: Diagnosis not present

## 2016-05-07 DIAGNOSIS — R928 Other abnormal and inconclusive findings on diagnostic imaging of breast: Secondary | ICD-10-CM

## 2016-06-14 ENCOUNTER — Encounter (HOSPITAL_COMMUNITY): Payer: Self-pay | Admitting: Emergency Medicine

## 2016-06-14 ENCOUNTER — Emergency Department (HOSPITAL_COMMUNITY)
Admission: EM | Admit: 2016-06-14 | Discharge: 2016-06-15 | Disposition: A | Discharge status: Home / Self Care | Payer: Medicare Other | Attending: Emergency Medicine | Admitting: Emergency Medicine

## 2016-06-14 DIAGNOSIS — Z8540 Personal history of malignant neoplasm of unspecified female genital organ: Secondary | ICD-10-CM | POA: Diagnosis not present

## 2016-06-14 DIAGNOSIS — E039 Hypothyroidism, unspecified: Secondary | ICD-10-CM | POA: Diagnosis not present

## 2016-06-14 DIAGNOSIS — Y92017 Garden or yard in single-family (private) house as the place of occurrence of the external cause: Secondary | ICD-10-CM | POA: Diagnosis not present

## 2016-06-14 DIAGNOSIS — Z79899 Other long term (current) drug therapy: Secondary | ICD-10-CM | POA: Diagnosis not present

## 2016-06-14 DIAGNOSIS — Y999 Unspecified external cause status: Secondary | ICD-10-CM | POA: Insufficient documentation

## 2016-06-14 DIAGNOSIS — T782XXA Anaphylactic shock, unspecified, initial encounter: Secondary | ICD-10-CM | POA: Insufficient documentation

## 2016-06-14 DIAGNOSIS — W57XXXA Bitten or stung by nonvenomous insect and other nonvenomous arthropods, initial encounter: Secondary | ICD-10-CM | POA: Diagnosis not present

## 2016-06-14 DIAGNOSIS — I10 Essential (primary) hypertension: Secondary | ICD-10-CM | POA: Insufficient documentation

## 2016-06-14 DIAGNOSIS — Y9389 Activity, other specified: Secondary | ICD-10-CM | POA: Insufficient documentation

## 2016-06-14 DIAGNOSIS — T7840XA Allergy, unspecified, initial encounter: Secondary | ICD-10-CM | POA: Diagnosis not present

## 2016-06-14 DIAGNOSIS — R21 Rash and other nonspecific skin eruption: Secondary | ICD-10-CM | POA: Diagnosis not present

## 2016-06-14 DIAGNOSIS — L299 Pruritus, unspecified: Secondary | ICD-10-CM | POA: Diagnosis not present

## 2016-06-14 MED ORDER — FAMOTIDINE 40 MG PO TABS: 40.0000 mg | ORAL_TABLET | Freq: Every day | ORAL | 0 refills | Status: DC

## 2016-06-14 MED ORDER — DIPHENHYDRAMINE HCL 25 MG PO TABS: 50.0000 mg | ORAL_TABLET | Freq: Four times a day (QID) | ORAL | 0 refills | Status: DC | PRN

## 2016-06-14 MED ORDER — EPINEPHRINE 0.3 MG/0.3ML IJ SOAJ: 0.3000 mg | Freq: Once | INTRAMUSCULAR | 1 refills | Status: DC | PRN

## 2016-06-14 MED ORDER — METHYLPREDNISOLONE SODIUM SUCC 125 MG IJ SOLR
125.0000 mg | Freq: Once | INTRAMUSCULAR | Status: AC
  Administered 2016-06-14: 125 mg via INTRAVENOUS
  Filled 2016-06-14: qty 2

## 2016-06-14 MED ORDER — PREDNISONE 10 MG PO TABS: 50.0000 mg | ORAL_TABLET | Freq: Every day | ORAL | 0 refills | Status: DC

## 2016-06-14 NOTE — ED Provider Notes (Signed)
[facility] DEPT Provider Note   CSN: [id number] Arrival date & time: 06/14/16  1952     History   Chief Complaint Chief Complaint  Patient presents with  . Allergic Reaction  . Insect Bite    HPI Kristina [name] is a 83 y.o. female.  HPI 83 year old female who presents with anaphylaxis prior to arrival. She has a history of hypertension, hypothyroidism, and GERD. Patient was outside, and stung by an insect. Shortly afterwards began to developed hives and itchiness over her chest arms and neck. Her daughter returned home 20 minutes after the incidence and found that she was ill-appearing, pale, diaphoretic. EMS was subsequently called. On their arrival she received 50 mg of Benadryl and 0.3 mg of IM epinephrine. All of her symptoms resolved after receiving these medications and she feels back to normal. She denies any chest pain, difficulty breathing, confusion, nausea or vomiting, abdominal pain. No new medications. No recent food exposures and no new detergent/soap/lotions.   Past Medical History:  Diagnosis Date  . Benign paroxysmal positional vertigo   . Diverticulosis   . Duodenal ulcer   . Endometrial cancer [facility])    age 27  . GERD (gastroesophageal reflux disease)   . Hypertension   . Hypothyroidism   . Memory loss   . Peripheral neuropathy   . Tubulovillous adenoma polyp of colon 04/1998    Patient Active Problem List   Diagnosis Date Noted  . Abnormality of gait 04/23/2016  . Hypothyroidism 02/05/2016  . HTN (hypertension) 02/05/2016  . Mild cognitive impairment 09/23/2015  . Paresthesia 01/28/2014  . Hyperreflexia 01/28/2014  . ABDOMINAL PAIN-RUQ 09/20/2008  . GERD 01/16/2008  . OTHER POSTOPERATIVE FUNCTIONAL DISORDERS 01/16/2008  . DIARRHEA 01/16/2008  . TUBULOVILLOUS ADENOMA, COLON, HX OF 01/10/2008    Past Surgical History:  Procedure Laterality Date  . CATARACT EXTRACTION    . CHOLECYSTECTOMY    . FOOT SURGERY     Right  . KNEE SURGERY     Right  . VAGINAL HYSTERECTOMY      OB History    No data available       Home Medications    Prior to Admission medications   Medication Sig Start Date End Date Taking? Authorizing Provider  atenolol (TENORMIN) 25 MG tablet Take 1 tablet (25 mg total) by mouth daily. 04/19/16  Yes Midge Minium, MD  levothyroxine (SYNTHROID, LEVOTHROID) 75 MCG tablet Take 1 tablet (75 mcg total) by mouth daily. 03/26/16  Yes Midge Minium, MD  loperamide (IMODIUM) 2 MG capsule Take 2 mg by mouth at bedtime.    Yes [provider]  memantine (NAMENDA) 10 MG tablet Take 1 tablet (10 mg total) by mouth 2 (two) times daily. 12/16/15  Yes Marcial Pacas, MD  naproxen sodium (ANAPROX) 220 MG tablet Take 220 mg by mouth at bedtime.    Yes [provider]    Family History Family History  Problem Relation Age of Onset  . Dementia Mother   . Heart attack Father   . Heart disease Sister   . Breast cancer Sister 14  . Cancer Brother   . Colon cancer Neg Hx     Social History Social History  Substance Use Topics  . Smoking status: Never Smoker  . Smokeless tobacco: Never Used  . Alcohol use No     Allergies   Bee venom and Penicillins   Review of Systems Review of Systems  Constitutional: Negative for fever.  Respiratory: Negative for shortness  of breath.   Cardiovascular: Negative for chest pain.  Gastrointestinal: Negative for abdominal pain and vomiting.  Skin: Positive for rash.  Neurological: Negative for light-headedness.  Hematological: Does not bruise/bleed easily.  Psychiatric/Behavioral: Negative for confusion.  All other systems reviewed and are negative.    Physical Exam Updated Vital Signs BP (!) 111/55   Pulse 65   Temp 97.8 F (36.6 C) (Oral)   Resp 18   Ht 5\' 2"  (1.575 m)   Wt 73.5 kg (162 lb)   SpO2 95%   BMI 29.63 kg/m   Physical Exam Physical Exam  Nursing note and vitals reviewed. Constitutional: Non-toxic, and in no acute  distress Head: Normocephalic and atraumatic.  Mouth/Throat: Oropharynx is clear and moist.  Neck: Normal range of motion. Neck supple.  Cardiovascular: Normal rate and regular rhythm.   Pulmonary/Chest: Effort normal and breath sounds normal.  Abdominal: Soft. There is no tenderness. There is no rebound and no guarding.  Musculoskeletal: Normal range of motion.  Neurological: Alert, no facial droop, fluent speech, moves all extremities symmetrically Skin: Skin is warm and dry.  Psychiatric: Cooperative   ED Treatments / Results  Labs (all labs ordered are listed, but only abnormal results are displayed) Labs Reviewed - No data to display  EKG  EKG Interpretation None       Radiology No results found.  Procedures Procedures (including critical care time)  Medications Ordered in ED Medications  methylPREDNISolone sodium succinate (SOLU-MEDROL) 125 mg/2 mL injection 125 mg (125 mg Intravenous Given 06/14/16 2019)     Initial Impression / Assessment and Plan / ED Course  I have reviewed the triage vital signs and the nursing notes.  Pertinent labs & imaging results that were available during my care of the patient were reviewed by me and considered in my medical decision making (see chart for details).     Presents after anaphylaxis for insect sting. On my evaluation her vital signs are within normal limits, she is well-appearing, and prior symptoms of anaphylaxis now resolved. Is given Solu-Medrol additionally. Plan to observe for 4 hours, and if clinically stable for discharge home with steroids, Benadryl, and EpiPen.  Observed in ED. Well appearing. Stable vitals. Felt to be appropriate for outpatient management. Instructed on use of epipen to patient and daughter. Will discharge with benadryl, pepcid, and steroids. Strict return and follow-up instructions reviewed. Patient and family expressed understanding of all discharge instructions and felt comfortable with the plan  of care.  Final Clinical Impressions(s) / ED Diagnoses   Final diagnoses:  Anaphylaxis, initial encounter    New Prescriptions New Prescriptions   No medications on file     Forde Dandy, MD 06/14/16 2305

## 2016-06-14 NOTE — ED Notes (Signed)
Liu at bedside. 

## 2016-06-14 NOTE — ED Notes (Signed)
Pt given water and ice with MD permission.

## 2016-06-14 NOTE — Discharge Instructions (Signed)
Please take steroids, benadryl, pepcid as needed for allergic reaction. Give epipen for severe allergic reaction into the thigh and hold for few seconds. Given for difficulty breathing, passing out (or very sweaty/pale as she was earlier), swelling in the mouth or throat, and hives WITH nausea/vomiting, difficulty breathing, passing out or near passing out.   Return without fail for worsening symptoms, including recurrent severe allergic reaction, or any other symptoms concerning to you.

## 2016-06-14 NOTE — ED Triage Notes (Signed)
Pt arrived EMS from home after she was bitten by "something" while doing yard work. Per EMS pt presented with itching as well as hives to her chest,arms, and neck. Pt given 50 benadryl, 0.3  Epi IM, and 220mL NS PTA

## 2016-08-05 ENCOUNTER — Ambulatory Visit (INDEPENDENT_AMBULATORY_CARE_PROVIDER_SITE_OTHER): Payer: Medicare Other

## 2016-08-05 DIAGNOSIS — Z0000 Encounter for general adult medical examination without abnormal findings: Secondary | ICD-10-CM | POA: Diagnosis not present

## 2016-08-05 DIAGNOSIS — Z23 Encounter for immunization: Secondary | ICD-10-CM

## 2016-08-05 MED ORDER — ZOSTER VAC RECOMB ADJUVANTED 50 MCG/0.5ML IM SUSR: 0.5000 mL | Freq: Once | INTRAMUSCULAR | 1 refills | Status: AC

## 2016-08-05 NOTE — Progress Notes (Addendum)
Subjective:   Kristina [name] is a 83 y.o. female who presents for an Initial Medicare Annual Wellness Visit.  Volunteers at [place]    No ROS.  Medicare Wellness Visit. Additional risk factors are reflected in the social history.  Cardiac Risk Factors include: advanced age (>65men, >60 women);hypertension;obesity (BMI >30kg/m2);family history of premature cardiovascular disease   Sleep patterns: Sleeps 8 hours, feels rested. Up to void x 1.  Home Safety/Smoke Alarms: Feels safe in home. Smoke alarms in place.  Living environment; residence and Firearm Safety: Daughter and son-in-law live with pt in 1 story home.  Seat Belt Safety/Bike Helmet: Wears seat belt.   Counseling:   Eye Exam-Last exam 01/12/2015, yearly.  Dental-Last exam 01/2016, every 6 months.   Female:   DDU-2025       Mammo-05/04/2016, benign right. [place] Left breast 05/07/16, BI-RADS CAT 3:probably benign.  Dexa scan-Declines        [facility]-Colonoscopy 09/14/2013, polyps. Recall 3 years. Aged out.      Objective:    Today's Vitals   08/05/16 1355  BP: 128/64  Pulse: (!) 59  SpO2: 98%  Weight: 175 lb (79.4 kg)  Height: 5\' 2"  (1.575 m)   Body mass index is 32.01 kg/m.   Current Medications (verified) Outpatient Encounter Prescriptions as of 08/05/2016  Medication Sig  . atenolol (TENORMIN) 25 MG tablet Take 1 tablet (25 mg total) by mouth daily.  . diphenhydrAMINE (BENADRYL) 25 MG tablet Take 2 tablets (50 mg total) by mouth every 6 (six) hours as needed for itching (or hives).  . EPINEPHrine 0.3 mg/0.3 mL IJ SOAJ injection Inject 0.3 mLs (0.3 mg total) into the muscle once as needed (severe allergic reaction).  Marland Kitchen levothyroxine (SYNTHROID, LEVOTHROID) 75 MCG tablet Take 1 tablet (75 mcg total) by mouth daily.  Marland Kitchen loperamide (IMODIUM) 2 MG capsule Take 2 mg by mouth at bedtime.   . memantine (NAMENDA) 10 MG tablet Take 1 tablet (10 mg total) by mouth 2 (two) times daily.  .  naproxen sodium (ANAPROX) 220 MG tablet Take 220 mg by mouth at bedtime.   . famotidine (PEPCID) 40 MG tablet Take 1 tablet (40 mg total) by mouth daily. (Patient not taking: Reported on 08/05/2016)  . Zoster Vac Recomb Adjuvanted [facility]) injection Inject 0.5 mLs into the muscle once.  . [DISCONTINUED] predniSONE (DELTASONE) 10 MG tablet Take 5 tablets (50 mg total) by mouth daily.   No facility-administered encounter medications on file as of 08/05/2016.     Allergies (verified) Bee venom and Penicillins   History: Past Medical History:  Diagnosis Date  . Benign paroxysmal positional vertigo   . Diverticulosis   . Duodenal ulcer   . Endometrial cancer [facility])    age 4  . GERD (gastroesophageal reflux disease)   . Hypertension   . Hypothyroidism   . Memory loss   . Peripheral neuropathy   . 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 CATARACT EXTRACTION    . CHOLECYSTECTOMY    . FOOT SURGERY     Right  . KNEE SURGERY     Right  . VAGINAL HYSTERECTOMY     Family History  Problem Relation Age of Onset  . Dementia Mother   . Heart attack Father   . Heart disease Sister   . Breast cancer Sister 48  . Cancer Brother   . Colon cancer Neg Hx  Social History   Occupational History  . Retired    Social History Main Topics  . Smoking status: Never Smoker  . Smokeless tobacco: Never Used  . Alcohol use No  . Drug use: No  . Sexual activity: Not on file    Tobacco Counseling Counseling given: Not Answered   Activities of Daily Living In your present state of health, do you have any difficulty performing the following activities: 08/05/2016 02/05/2016  Hearing? N N  Vision? N Y  Difficulty concentrating or making decisions? N N  Walking or climbing stairs? N N  Dressing or bathing? N N  Doing errands, shopping? N N  Preparing Food and eating ? N N  Using the Toilet? N N  In the past six months, have you  accidently leaked urine? N Y  Do you have problems with loss of bowel control? N Y  Managing your Medications? N N  Managing your Finances? N N  Housekeeping or managing your Housekeeping? N N  Some recent data might be hidden    Immunizations and Health Maintenance Immunization History  Administered Date(s) Administered  . Influenza, High Dose Seasonal PF 11/05/2015  . Influenza,inj,Quad PF,36+ Mos 10/30/2014  . Pneumococcal Conjugate-13 11/05/2015  . Pneumococcal Polysaccharide-23 01/06/2001, 08/05/2016  . Td 03/12/2003   Pneumovax 23 administered today. Shingrix vaccine prescription sent to pharmacy.   There are no preventive care reminders to display for this patient.  Patient Care Team: Midge Minium, MD as PCP - General (Family Medicine) Marcial Pacas, MD as Consulting Physician (Neurology) Ladene Artist, MD as Consulting Physician (Gastroenterology)  Indicate any recent Medical Services you may have received from other than [facility] providers in the past year (date may be approximate).     Assessment:   This is a routine wellness examination for [name]. Physical assessment deferred to PCP.   Hearing/Vision screen Hearing Screening Comments: Able to hear conversational tones w/o difficulty. No issues reported.   Vision Screening Comments: Wears glasses.  Dietary issues and exercise activities discussed: Exercise limited by: None identified   Diet (meal preparation, eat out, water intake, caffeinated beverages, dairy products, fruits and vegetables): Drinks water and tea.   Breakfast: yogurt; cereal/fruit Lunch: stew/vegetables Dinner: Snacks about 3-4pm  Goals    . Increase physical activity          Increasing walking around yard.       Depression Screen PHQ 2/9 Scores 08/05/2016 02/05/2016  PHQ - 2 Score 0 0  PHQ- 9 Score - 0    Fall Risk Fall Risk  08/05/2016 04/23/2016 02/05/2016  Falls in the past year? Yes No No  Number falls in past yr: 1 - -   Injury with Fall? No - -  Risk for fall due to : Impaired mobility - -  Follow up Falls prevention discussed - -    Cognitive Function: MMSE - Mini Mental State Exam 08/05/2016 04/23/2016 09/23/2015  Orientation to time 5 3 5   Orientation to Place 5 5 5   Registration 3 3 3   Attention/ Calculation 5 5 5   Recall 1 1 0  Language- name 2 objects 2 2 2   Language- repeat 1 0 1  Language- follow 3 step command 3 3 3   Language- read & follow direction 1 1 1   Write a sentence 1 1 1   Copy design 1 1 1   Total score 28 25 27         Screening Tests Health Maintenance  Topic Date Due  .  TETANUS/TDAP  10/11/2016 (Originally 03/11/2013)  . INFLUENZA VACCINE  08/11/2016  . COLONOSCOPY  09/14/2016  . MAMMOGRAM  05/07/2017  . DEXA SCAN  Completed  . PNA vac Low Risk Adult  Completed  Declines DEXA scan, may have next year with mammogram.     Plan:    Bring a copy of your advance directives to your next office visit.  Continue doing brain stimulating activities (puzzles, reading, adult coloring books, staying active) to keep memory sharp.   Shingrix vaccine at pharmacy.    I have personally reviewed and noted the following in the patient's chart:   . Medical and social history . Use of alcohol, tobacco or illicit drugs  . Current medications and supplements . Functional ability and status . Nutritional status . Physical activity . Advanced directives . List of other physicians . Hospitalizations, surgeries, and ER visits in previous 12 months . Vitals . Screenings to include cognitive, depression, and falls . Referrals and appointments  In addition, I have reviewed and discussed with patient certain preventive protocols, quality metrics, and best practice recommendations. A written personalized care plan for preventive services as well as general preventive health recommendations were provided to patient.     Gerilyn Nestle, RN   08/05/2016    PCP Notes: -Shingrix Rx  sent to pharmacy -Declines DEXA right now (may have next yr with mammo) -Pt's daughter called prior to appt to make [place] aware patient had a 2 falls in the past year, one hitting her head. When questioned about falls, she did not remember a fall with hitting her head. Encouraged to keep cell phone in pocket at all times when home alone. -Pt reports dizziness when standing from chair x 2 weeks. Encouraged to sit on edge and stand slowly. -CPE appointment with PCP on 08/06/2016  Reviewed documentation and agree w/ above.  Will address dizziness at CPE and also discuss possible life alert.  Annye Asa, MD

## 2016-08-05 NOTE — Patient Instructions (Addendum)
Bring a copy of your advance directives to your next office visit.  Continue doing brain stimulating activities (puzzles, reading, adult coloring books, staying active) to keep memory sharp.   Shingrix vaccine at pharmacy.    Ms. Kristina [name] , Thank you for taking time to come for your Medicare Wellness Visit. I appreciate your ongoing commitment to your health goals. Please review the following plan we discussed and let me know if I can assist you in the future.   These are the goals we discussed: Goals    . Increase physical activity          Increasing walking around yard.        This is a list of the screening recommended for you and due dates:  Health Maintenance  Topic Date Due  . Tetanus Vaccine  10/11/2016*  . Flu Shot  08/11/2016  . Colon Cancer Screening  09/14/2016  . Mammogram  05/07/2017  . DEXA scan (bone density measurement)  Completed  . Pneumonia vaccines  Completed  *Topic was postponed. The date shown is not the original due date.     Health Maintenance, Female Adopting a healthy lifestyle and getting preventive care can go a long way to promote health and wellness. Talk with your health care provider about what schedule of regular examinations is right for you. This is a good chance for you to check in with your provider about disease prevention and staying healthy. In between checkups, there are plenty of things you can do on your own. Experts have done a lot of research about which lifestyle changes and preventive measures are most likely to keep you healthy. Ask your health care provider for more information. Weight and diet Eat a healthy diet  Be sure to include plenty of vegetables, fruits, low-fat dairy products, and lean protein.  Do not eat a lot of foods high in solid fats, added sugars, or salt.  Get regular exercise. This is one of the most important things you can do for your health. ? Most adults should exercise for at least 150 minutes each week.  The exercise should increase your heart rate and make you sweat (moderate-intensity exercise). ? Most adults should also do strengthening exercises at least twice a week. This is in addition to the moderate-intensity exercise.  Maintain a healthy weight  Body mass index (BMI) is a measurement that can be used to identify possible weight problems. It estimates body fat based on height and weight. Your health care provider can help determine your BMI and help you achieve or maintain a healthy weight.  For females 55 years of age and older: ? A BMI below 18.5 is considered underweight. ? A BMI of 18.5 to 24.9 is normal. ? A BMI of 25 to 29.9 is considered overweight. ? A BMI of 30 and above is considered obese.  Watch levels of cholesterol and blood lipids  You should start having your blood tested for lipids and cholesterol at 81 years of age, then have this test every 5 years.  You may need to have your cholesterol levels checked more often if: ? Your lipid or cholesterol levels are high. ? You are older than 81 years of age. ? You are at high risk for heart disease.  Cancer screening Lung Cancer  Lung cancer screening is recommended for adults 42-44 years old who are at high risk for lung cancer because of a history of smoking.  A yearly low-dose CT scan of the lungs  is recommended for people who: ? Currently smoke. ? Have quit within the past 15 years. ? Have at least a 30-pack-year history of smoking. A pack year is smoking an average of one pack of cigarettes a day for 1 year.  Yearly screening should continue until it has been 15 years since you quit.  Yearly screening should stop if you develop a health problem that would prevent you from having lung cancer treatment.  Breast Cancer  Practice breast self-awareness. This means understanding how your breasts normally appear and feel.  It also means doing regular breast self-exams. Let your health care provider know about  any changes, no matter how small.  If you are in your 20s or 30s, you should have a clinical breast exam (CBE) by a health care provider every 1-3 years as part of a regular health exam.  If you are 35 or older, have a CBE every year. Also consider having a breast X-ray (mammogram) every year.  If you have a family history of breast cancer, talk to your health care provider about genetic screening.  If you are at high risk for breast cancer, talk to your health care provider about having an MRI and a mammogram every year.  Breast cancer gene (BRCA) assessment is recommended for women who have family members with BRCA-related cancers. BRCA-related cancers include: ? Breast. ? Ovarian. ? Tubal. ? Peritoneal cancers.  Results of the assessment will determine the need for genetic counseling and BRCA1 and BRCA2 testing.  Cervical Cancer Your health care provider may recommend that you be screened regularly for cancer of the pelvic organs (ovaries, uterus, and vagina). This screening involves a pelvic examination, including checking for microscopic changes to the surface of your cervix (Pap test). You may be encouraged to have this screening done every 3 years, beginning at age 51.  For women ages 76-65, health care providers may recommend pelvic exams and Pap testing every 3 years, or they may recommend the Pap and pelvic exam, combined with testing for human papilloma virus (HPV), every 5 years. Some types of HPV increase your risk of cervical cancer. Testing for HPV may also be done on women of any age with unclear Pap test results.  Other health care providers may not recommend any screening for nonpregnant women who are considered low risk for pelvic cancer and who do not have symptoms. Ask your health care provider if a screening pelvic exam is right for you.  If you have had past treatment for cervical cancer or a condition that could lead to cancer, you need Pap tests and screening for  cancer for at least 20 years after your treatment. If Pap tests have been discontinued, your risk factors (such as having a new sexual partner) need to be reassessed to determine if screening should resume. Some women have medical problems that increase the chance of getting cervical cancer. In these cases, your health care provider may recommend more frequent screening and Pap tests.  Colorectal Cancer  This type of cancer can be detected and often prevented.  Routine colorectal cancer screening usually begins at 81 years of age and continues through 81 years of age.  Your health care provider may recommend screening at an earlier age if you have risk factors for colon cancer.  Your health care provider may also recommend using home test kits to check for hidden blood in the stool.  A small camera at the end of a tube can be used to examine  your colon directly (sigmoidoscopy or colonoscopy). This is done to check for the earliest forms of colorectal cancer.  Routine screening usually begins at age 41.  Direct examination of the colon should be repeated every 5-10 years through 81 years of age. However, you may need to be screened more often if early forms of precancerous polyps or small growths are found.  Skin Cancer  Check your skin from head to toe regularly.  Tell your health care provider about any new moles or changes in moles, especially if there is a change in a mole's shape or color.  Also tell your health care provider if you have a mole that is larger than the size of a pencil eraser.  Always use sunscreen. Apply sunscreen liberally and repeatedly throughout the day.  Protect yourself by wearing long sleeves, pants, a wide-brimmed hat, and sunglasses whenever you are outside.  Heart disease, diabetes, and high blood pressure  High blood pressure causes heart disease and increases the risk of stroke. High blood pressure is more likely to develop in: ? People who have blood  pressure in the high end of the normal range (130-139/85-89 mm Hg). ? People who are overweight or obese. ? People who are African American.  If you are 14-55 years of age, have your blood pressure checked every 3-5 years. If you are 62 years of age or older, have your blood pressure checked every year. You should have your blood pressure measured twice-once when you are at a hospital or clinic, and once when you are not at a hospital or clinic. Record the average of the two measurements. To check your blood pressure when you are not at a hospital or clinic, you can use: ? An automated blood pressure machine at a pharmacy. ? A home blood pressure monitor.  If you are between 82 years and 53 years old, ask your health care provider if you should take aspirin to prevent strokes.  Have regular diabetes screenings. This involves taking a blood sample to check your fasting blood sugar level. ? If you are at a normal weight and have a low risk for diabetes, have this test once every three years after 81 years of age. ? If you are overweight and have a high risk for diabetes, consider being tested at a younger age or more often. Preventing infection Hepatitis B  If you have a higher risk for hepatitis B, you should be screened for this virus. You are considered at high risk for hepatitis B if: ? You were born in a country where hepatitis B is common. Ask your health care provider which countries are considered high risk. ? Your parents were born in a high-risk country, and you have not been immunized against hepatitis B (hepatitis B vaccine). ? You have HIV or AIDS. ? You use needles to inject street drugs. ? You live with someone who has hepatitis B. ? You have had sex with someone who has hepatitis B. ? You get hemodialysis treatment. ? You take certain medicines for conditions, including cancer, organ transplantation, and autoimmune conditions.  Hepatitis C  Blood testing is recommended  for: ? Everyone born from [birth year] through 1963. ? Anyone with known risk factors for hepatitis C.  Sexually transmitted infections (STIs)  You should be screened for sexually transmitted infections (STIs) including gonorrhea and chlamydia if: ? You are sexually active and are younger than 81 years of age. ? You are older than 81 years of age and your  health care provider tells you that you are at risk for this type of infection. ? Your sexual activity has changed since you were last screened and you are at an increased risk for chlamydia or gonorrhea. Ask your health care provider if you are at risk.  If you do not have HIV, but are at risk, it may be recommended that you take a prescription medicine daily to prevent HIV infection. This is called pre-exposure prophylaxis (PrEP). You are considered at risk if: ? You are sexually active and do not regularly use condoms or know the HIV status of your partner(s). ? You take drugs by injection. ? You are sexually active with a partner who has HIV.  Talk with your health care provider about whether you are at high risk of being infected with HIV. If you choose to begin PrEP, you should first be tested for HIV. You should then be tested every 3 months for as long as you are taking PrEP. Pregnancy  If you are premenopausal and you may become pregnant, ask your health care provider about preconception counseling.  If you may become pregnant, take 400 to 800 micrograms (mcg) of folic acid every day.  If you want to prevent pregnancy, talk to your health care provider about birth control (contraception). Osteoporosis and menopause  Osteoporosis is a disease in which the bones lose minerals and strength with aging. This can result in serious bone fractures. Your risk for osteoporosis can be identified using a bone density scan.  If you are 85 years of age or older, or if you are at risk for osteoporosis and fractures, ask your health care provider if  you should be screened.  Ask your health care provider whether you should take a calcium or vitamin D supplement to lower your risk for osteoporosis.  Menopause may have certain physical symptoms and risks.  Hormone replacement therapy may reduce some of these symptoms and risks. Talk to your health care provider about whether hormone replacement therapy is right for you. Follow these instructions at home:  Schedule regular health, dental, and eye exams.  Stay current with your immunizations.  Do not use any tobacco products including cigarettes, chewing tobacco, or electronic cigarettes.  If you are pregnant, do not drink alcohol.  If you are breastfeeding, limit how much and how often you drink alcohol.  Limit alcohol intake to no more than 1 drink per day for nonpregnant women. One drink equals 12 ounces of beer, 5 ounces of wine, or 1 ounces of hard liquor.  Do not use street drugs.  Do not share needles.  Ask your health care provider for help if you need support or information about quitting drugs.  Tell your health care provider if you often feel depressed.  Tell your health care provider if you have ever been abused or do not feel safe at home. This information is not intended to replace advice given to you by your health care provider. Make sure you discuss any questions you have with your health care provider. Document Released: 07/13/2010 Document Revised: 06/05/2015 Document Reviewed: 10/01/2014 Elsevier Interactive Patient Education  2018 [place] in the Home Falls can cause injuries. They can happen to people of all ages. There are many things you can do to make your home safe and to help prevent falls. What can I do on the outside of my home?  Regularly fix the edges of walkways and driveways and fix any cracks.  Remove anything that might make you trip as you walk through a door, such as a raised step or threshold.  Trim any bushes  or trees on the path to your home.  Use bright outdoor lighting.  Clear any walking paths of anything that might make someone trip, such as rocks or tools.  Regularly check to see if handrails are loose or broken. Make sure that both sides of any steps have handrails.  Any raised decks and porches should have guardrails on the edges.  Have any leaves, snow, or ice cleared regularly.  Use sand or salt on walking paths during winter.  Clean up any spills in your garage right away. This includes oil or grease spills. What can I do in the bathroom?  Use night lights.  Install grab bars by the toilet and in the tub and shower. Do not use towel bars as grab bars.  Use non-skid mats or decals in the tub or shower.  If you need to sit down in the shower, use a plastic, non-slip stool.  Keep the floor dry. Clean up any water that spills on the floor as soon as it happens.  Remove soap buildup in the tub or shower regularly.  Attach bath mats securely with double-sided non-slip rug tape.  Do not have throw rugs and other things on the floor that can make you trip. What can I do in the bedroom?  Use night lights.  Make sure that you have a light by your bed that is easy to reach.  Do not use any sheets or blankets that are too big for your bed. They should not hang down onto the floor.  Have a firm chair that has side arms. You can use this for support while you get dressed.  Do not have throw rugs and other things on the floor that can make you trip. What can I do in the kitchen?  Clean up any spills right away.  Avoid walking on wet floors.  Keep items that you use a lot in easy-to-reach places.  If you need to reach something above you, use a strong step stool that has a grab bar.  Keep electrical cords out of the way.  Do not use floor polish or wax that makes floors slippery. If you must use wax, use non-skid floor wax.  Do not have throw rugs and other things on  the floor that can make you trip. What can I do with my stairs?  Do not leave any items on the stairs.  Make sure that there are handrails on both sides of the stairs and use them. Fix handrails that are broken or loose. Make sure that handrails are as long as the stairways.  Check any carpeting to make sure that it is firmly attached to the stairs. Fix any carpet that is loose or worn.  Avoid having throw rugs at the top or bottom of the stairs. If you do have throw rugs, attach them to the floor with carpet tape.  Make sure that you have a light switch at the top of the stairs and the bottom of the stairs. If you do not have them, ask someone to add them for you. What else can I do to help prevent falls?  Wear shoes that: ? Do not have high heels. ? Have rubber bottoms. ? Are comfortable and fit you well. ? Are closed at the toe. Do not wear sandals.  If you use a stepladder: ?  Make sure that it is fully opened. Do not climb a closed stepladder. ? Make sure that both sides of the stepladder are locked into place. ? Ask someone to hold it for you, if possible.  Clearly mark and make sure that you can see: ? Any grab bars or handrails. ? First and last steps. ? Where the edge of each step is.  Use tools that help you move around (mobility aids) if they are needed. These include: ? Canes. ? Walkers. ? Scooters. ? Crutches.  Turn on the lights when you go into a dark area. Replace any light bulbs as soon as they burn out.  Set up your furniture so you have a clear path. Avoid moving your furniture around.  If any of your floors are uneven, fix them.  If there are any pets around you, be aware of where they are.  Review your medicines with your doctor. Some medicines can make you feel dizzy. This can increase your chance of falling. Ask your doctor what other things that you can do to help prevent falls. This information is not intended to replace advice given to you by  your health care provider. Make sure you discuss any questions you have with your health care provider. Document Released: 10/24/2008 Document Revised: 06/05/2015 Document Reviewed: 02/01/2014 Elsevier Interactive Patient Education  2018 [place] Maintenance, Female Adopting a healthy lifestyle and getting preventive care can go a long way to promote health and wellness. Talk with your health care provider about what schedule of regular examinations is right for you. This is a good chance for you to check in with your provider about disease prevention and staying healthy. In between checkups, there are plenty of things you can do on your own. Experts have done a lot of research about which lifestyle changes and preventive measures are most likely to keep you healthy. Ask your health care provider for more information. Weight and diet Eat a healthy diet  Be sure to include plenty of vegetables, fruits, low-fat dairy products, and lean protein.  Do not eat a lot of foods high in solid fats, added sugars, or salt.  Get regular exercise. This is one of the most important things you can do for your health. ? Most adults should exercise for at least 150 minutes each week. The exercise should increase your heart rate and make you sweat (moderate-intensity exercise). ? Most adults should also do strengthening exercises at least twice a week. This is in addition to the moderate-intensity exercise.  Maintain a healthy weight  Body mass index (BMI) is a measurement that can be used to identify possible weight problems. It estimates body fat based on height and weight. Your health care provider can help determine your BMI and help you achieve or maintain a healthy weight.  For females 19 years of age and older: ? A BMI below 18.5 is considered underweight. ? A BMI of 18.5 to 24.9 is normal. ? A BMI of 25 to 29.9 is considered overweight. ? A BMI of 30 and above is considered  obese.  Watch levels of cholesterol and blood lipids  You should start having your blood tested for lipids and cholesterol at 81 years of age, then have this test every 5 years.  You may need to have your cholesterol levels checked more often if: ? Your lipid or cholesterol levels are high. ? You are older than 81 years of age. ? You are at high risk for  heart disease.  Cancer screening Lung Cancer  Lung cancer screening is recommended for adults 90-39 years old who are at high risk for lung cancer because of a history of smoking.  A yearly low-dose CT scan of the lungs is recommended for people who: ? Currently smoke. ? Have quit within the past 15 years. ? Have at least a 30-pack-year history of smoking. A pack year is smoking an average of one pack of cigarettes a day for 1 year.  Yearly screening should continue until it has been 15 years since you quit.  Yearly screening should stop if you develop a health problem that would prevent you from having lung cancer treatment.  Breast Cancer  Practice breast self-awareness. This means understanding how your breasts normally appear and feel.  It also means doing regular breast self-exams. Let your health care provider know about any changes, no matter how small.  If you are in your 20s or 30s, you should have a clinical breast exam (CBE) by a health care provider every 1-3 years as part of a regular health exam.  If you are 74 or older, have a CBE every year. Also consider having a breast X-ray (mammogram) every year.  If you have a family history of breast cancer, talk to your health care provider about genetic screening.  If you are at high risk for breast cancer, talk to your health care provider about having an MRI and a mammogram every year.  Breast cancer gene (BRCA) assessment is recommended for women who have family members with BRCA-related cancers. BRCA-related cancers  include: ? Breast. ? Ovarian. ? Tubal. ? Peritoneal cancers.  Results of the assessment will determine the need for genetic counseling and BRCA1 and BRCA2 testing.  Cervical Cancer Your health care provider may recommend that you be screened regularly for cancer of the pelvic organs (ovaries, uterus, and vagina). This screening involves a pelvic examination, including checking for microscopic changes to the surface of your cervix (Pap test). You may be encouraged to have this screening done every 3 years, beginning at age 72.  For women ages 20-65, health care providers may recommend pelvic exams and Pap testing every 3 years, or they may recommend the Pap and pelvic exam, combined with testing for human papilloma virus (HPV), every 5 years. Some types of HPV increase your risk of cervical cancer. Testing for HPV may also be done on women of any age with unclear Pap test results.  Other health care providers may not recommend any screening for nonpregnant women who are considered low risk for pelvic cancer and who do not have symptoms. Ask your health care provider if a screening pelvic exam is right for you.  If you have had past treatment for cervical cancer or a condition that could lead to cancer, you need Pap tests and screening for cancer for at least 20 years after your treatment. If Pap tests have been discontinued, your risk factors (such as having a new sexual partner) need to be reassessed to determine if screening should resume. Some women have medical problems that increase the chance of getting cervical cancer. In these cases, your health care provider may recommend more frequent screening and Pap tests.  Colorectal Cancer  This type of cancer can be detected and often prevented.  Routine colorectal cancer screening usually begins at 81 years of age and continues through 81 years of age.  Your health care provider may recommend screening at an earlier age if you have  risk factors  for colon cancer.  Your health care provider may also recommend using home test kits to check for hidden blood in the stool.  A small camera at the end of a tube can be used to examine your colon directly (sigmoidoscopy or colonoscopy). This is done to check for the earliest forms of colorectal cancer.  Routine screening usually begins at age 50.  Direct examination of the colon should be repeated every 5-10 years through 81 years of age. However, you may need to be screened more often if early forms of precancerous polyps or small growths are found.  Skin Cancer  Check your skin from head to toe regularly.  Tell your health care provider about any new moles or changes in moles, especially if there is a change in a mole's shape or color.  Also tell your health care provider if you have a mole that is larger than the size of a pencil eraser.  Always use sunscreen. Apply sunscreen liberally and repeatedly throughout the day.  Protect yourself by wearing long sleeves, pants, a wide-brimmed hat, and sunglasses whenever you are outside.  Heart disease, diabetes, and high blood pressure  High blood pressure causes heart disease and increases the risk of stroke. High blood pressure is more likely to develop in: ? People who have blood pressure in the high end of the normal range (130-139/85-89 mm Hg). ? People who are overweight or obese. ? People who are African American.  If you are 31-61 years of age, have your blood pressure checked every 3-5 years. If you are 50 years of age or older, have your blood pressure checked every year. You should have your blood pressure measured twice-once when you are at a hospital or clinic, and once when you are not at a hospital or clinic. Record the average of the two measurements. To check your blood pressure when you are not at a hospital or clinic, you can use: ? An automated blood pressure machine at a pharmacy. ? A home blood pressure monitor.  If  you are between 54 years and 37 years old, ask your health care provider if you should take aspirin to prevent strokes.  Have regular diabetes screenings. This involves taking a blood sample to check your fasting blood sugar level. ? If you are at a normal weight and have a low risk for diabetes, have this test once every three years after 81 years of age. ? If you are overweight and have a high risk for diabetes, consider being tested at a younger age or more often. Preventing infection Hepatitis B  If you have a higher risk for hepatitis B, you should be screened for this virus. You are considered at high risk for hepatitis B if: ? You were born in a country where hepatitis B is common. Ask your health care provider which countries are considered high risk. ? Your parents were born in a high-risk country, and you have not been immunized against hepatitis B (hepatitis B vaccine). ? You have HIV or AIDS. ? You use needles to inject street drugs. ? You live with someone who has hepatitis B. ? You have had sex with someone who has hepatitis B. ? You get hemodialysis treatment. ? You take certain medicines for conditions, including cancer, organ transplantation, and autoimmune conditions.  Hepatitis C  Blood testing is recommended for: ? Everyone born from [birth year] through 1963. ? Anyone with known risk factors for hepatitis C.  Sexually transmitted infections (  STIs)  You should be screened for sexually transmitted infections (STIs) including gonorrhea and chlamydia if: ? You are sexually active and are younger than 81 years of age. ? You are older than 81 years of age and your health care provider tells you that you are at risk for this type of infection. ? Your sexual activity has changed since you were last screened and you are at an increased risk for chlamydia or gonorrhea. Ask your health care provider if you are at risk.  If you do not have HIV, but are at risk, it may be recommended  that you take a prescription medicine daily to prevent HIV infection. This is called pre-exposure prophylaxis (PrEP). You are considered at risk if: ? You are sexually active and do not regularly use condoms or know the HIV status of your partner(s). ? You take drugs by injection. ? You are sexually active with a partner who has HIV.  Talk with your health care provider about whether you are at high risk of being infected with HIV. If you choose to begin PrEP, you should first be tested for HIV. You should then be tested every 3 months for as long as you are taking PrEP. Pregnancy  If you are premenopausal and you may become pregnant, ask your health care provider about preconception counseling.  If you may become pregnant, take 400 to 800 micrograms (mcg) of folic acid every day.  If you want to prevent pregnancy, talk to your health care provider about birth control (contraception). Osteoporosis and menopause  Osteoporosis is a disease in which the bones lose minerals and strength with aging. This can result in serious bone fractures. Your risk for osteoporosis can be identified using a bone density scan.  If you are 1 years of age or older, or if you are at risk for osteoporosis and fractures, ask your health care provider if you should be screened.  Ask your health care provider whether you should take a calcium or vitamin D supplement to lower your risk for osteoporosis.  Menopause may have certain physical symptoms and risks.  Hormone replacement therapy may reduce some of these symptoms and risks. Talk to your health care provider about whether hormone replacement therapy is right for you. Follow these instructions at home:  Schedule regular health, dental, and eye exams.  Stay current with your immunizations.  Do not use any tobacco products including cigarettes, chewing tobacco, or electronic cigarettes.  If you are pregnant, do not drink alcohol.  If you are  breastfeeding, limit how much and how often you drink alcohol.  Limit alcohol intake to no more than 1 drink per day for nonpregnant women. One drink equals 12 ounces of beer, 5 ounces of wine, or 1 ounces of hard liquor.  Do not use street drugs.  Do not share needles.  Ask your health care provider for help if you need support or information about quitting drugs.  Tell your health care provider if you often feel depressed.  Tell your health care provider if you have ever been abused or do not feel safe at home. This information is not intended to replace advice given to you by your health care provider. Make sure you discuss any questions you have with your health care provider. Document Released: 07/13/2010 Document Revised: 06/05/2015 Document Reviewed: 10/01/2014 Elsevier Interactive Patient Education  [employer].

## 2016-08-06 ENCOUNTER — Other Ambulatory Visit (INDEPENDENT_AMBULATORY_CARE_PROVIDER_SITE_OTHER): Payer: Medicare Other

## 2016-08-06 ENCOUNTER — Encounter: Payer: Self-pay | Admitting: Family Medicine

## 2016-08-06 ENCOUNTER — Ambulatory Visit (INDEPENDENT_AMBULATORY_CARE_PROVIDER_SITE_OTHER): Payer: Medicare Other | Admitting: Family Medicine

## 2016-08-06 ENCOUNTER — Other Ambulatory Visit: Payer: Self-pay | Admitting: Family Medicine

## 2016-08-06 VITALS — BP 121/81 | HR 57 | Temp 98.1°F | Resp 16 | Ht 62.0 in | Wt 172.0 lb

## 2016-08-06 DIAGNOSIS — E039 Hypothyroidism, unspecified: Secondary | ICD-10-CM | POA: Diagnosis not present

## 2016-08-06 DIAGNOSIS — Z0000 Encounter for general adult medical examination without abnormal findings: Secondary | ICD-10-CM | POA: Diagnosis not present

## 2016-08-06 DIAGNOSIS — R7309 Other abnormal glucose: Secondary | ICD-10-CM

## 2016-08-06 DIAGNOSIS — E781 Pure hyperglyceridemia: Secondary | ICD-10-CM

## 2016-08-06 DIAGNOSIS — I10 Essential (primary) hypertension: Secondary | ICD-10-CM

## 2016-08-06 DIAGNOSIS — E559 Vitamin D deficiency, unspecified: Secondary | ICD-10-CM

## 2016-08-06 LAB — BASIC METABOLIC PANEL
BUN: 21 mg/dL (ref 6–23)
CO2: 33 mEq/L — ABNORMAL HIGH (ref 19–32)
Calcium: 9.1 mg/dL (ref 8.4–10.5)
Chloride: 99 mEq/L (ref 96–112)
Creatinine, Ser: 1.03 mg/dL (ref 0.40–1.20)
GFR: 54.43 mL/min — AB (ref >60.00)
Glucose, Bld: 143 mg/dL — ABNORMAL HIGH (ref 70–99)
POTASSIUM: 4.8 meq/L (ref 3.5–5.1)
SODIUM: 138 meq/L (ref 135–145)

## 2016-08-06 LAB — HEPATIC FUNCTION PANEL
ALK PHOS: 40 U/L (ref 39–117)
ALT: 8 U/L (ref 0–35)
AST: 14 U/L (ref 0–37)
Albumin: 3.6 g/dL (ref 3.5–5.2)
BILIRUBIN DIRECT: 0.1 mg/dL (ref 0.0–0.3)
BILIRUBIN TOTAL: 0.4 mg/dL (ref 0.2–1.2)
Total Protein: 6.4 g/dL (ref 6.0–8.3)

## 2016-08-06 LAB — CBC WITH DIFFERENTIAL/PLATELET
BASOS PCT: 1 % (ref 0.0–3.0)
Basophils Absolute: 0.1 10*3/uL (ref 0.0–0.1)
Eosinophils Absolute: 0.1 10*3/uL (ref 0.0–0.7)
Eosinophils Relative: 1.2 % (ref 0.0–5.0)
HCT: 39.6 % (ref 36.0–46.0)
Hemoglobin: 13.1 g/dL (ref 12.0–15.0)
LYMPHS ABS: 3.1 10*3/uL (ref 0.7–4.0)
Lymphocytes Relative: 32.8 % (ref 12.0–46.0)
MCHC: 33.1 g/dL (ref 30.0–36.0)
MCV: 97.6 fl (ref 78.0–100.0)
MONO ABS: 0.5 10*3/uL (ref 0.1–1.0)
MONOS PCT: 4.9 % (ref 3.0–12.0)
NEUTROS ABS: 5.7 10*3/uL (ref 1.4–7.7)
NEUTROS PCT: 60.1 % (ref 43.0–77.0)
PLATELETS: 347 10*3/uL (ref 150.0–400.0)
RBC: 4.05 Mil/uL (ref 3.87–5.11)
RDW: 13.9 % (ref 11.5–15.5)
WBC: 9.4 10*3/uL (ref 4.0–10.5)

## 2016-08-06 LAB — LIPID PANEL
CHOL/HDL RATIO: 5
CHOLESTEROL: 200 mg/dL (ref 0–200)
HDL: 43.1 mg/dL (ref >39.00)
NonHDL: 156.71
Triglycerides: 260 mg/dL — ABNORMAL HIGH (ref 0.0–149.0)
VLDL: 52 mg/dL — AB (ref 0.0–40.0)

## 2016-08-06 LAB — HEMOGLOBIN A1C: Hgb A1c MFr Bld: 6.5 % (ref 4.6–6.5)

## 2016-08-06 LAB — TSH: TSH: 2.21 u[IU]/mL (ref 0.35–4.50)

## 2016-08-06 LAB — VITAMIN D 25 HYDROXY (VIT D DEFICIENCY, FRACTURES): VITD: 22.22 ng/mL — ABNORMAL LOW (ref 30.00–100.00)

## 2016-08-06 LAB — LDL CHOLESTEROL, DIRECT: LDL DIRECT: 115 mg/dL

## 2016-08-06 MED ORDER — VITAMIN D3 50 MCG (2000 UT) PO CAPS: 2000.0000 [IU] | ORAL_CAPSULE | Freq: Every day | ORAL | 0 refills | Status: DC

## 2016-08-06 MED ORDER — FENOFIBRATE 160 MG PO TABS: 160.0000 mg | ORAL_TABLET | Freq: Every day | ORAL | 3 refills | Status: DC

## 2016-08-06 MED ORDER — VITAMIN D (ERGOCALCIFEROL) 1.25 MG (50000 UNIT) PO CAPS: 50000.0000 [IU] | ORAL_CAPSULE | ORAL | 0 refills | Status: DC

## 2016-08-06 NOTE — Assessment & Plan Note (Signed)
Pt's PE WNL.  UTD on colonoscopy, GYN, mammo.  Had Wellness visit earlier this week.  Check labs.  Anticipatory guidance provided.

## 2016-08-06 NOTE — Progress Notes (Signed)
   Subjective:    Patient ID: Kristina [name], female    DOB: [birth year], 83 y.o.   MRN: [id number]  HPI CPE- UTD on colonoscopy, mammo, immunizations.  Pt had Annual Wellness Visit done recently.  Pt has gained 10 lbs since 06/14/16.  No concerns today.   Review of Systems Patient reports no vision/ hearing changes, adenopathy,fever, weight change,  persistant/recurrent hoarseness , swallowing issues, chest pain, palpitations, edema, persistant/recurrent cough, hemoptysis, dyspnea (rest/exertional/paroxysmal nocturnal), gastrointestinal bleeding (melena, rectal bleeding), abdominal pain, significant heartburn, bowel changes, GU symptoms (dysuria, hematuria, incontinence), Gyn symptoms (abnormal  bleeding, pain),  syncope, focal weakness, memory loss, numbness & tingling, skin/hair/nail changes, abnormal bruising or bleeding, anxiety, or depression.     Objective:   Physical Exam General Appearance:    Alert, cooperative, no distress, appears stated age, obese  Head:    Normocephalic, without obvious abnormality, atraumatic  Eyes:    PERRL, conjunctiva/corneas clear, EOM's intact, fundi    benign, both eyes  Ears:    Normal TM's and external ear canals, both ears  Nose:   Nares normal, septum midline, mucosa normal, no drainage    or sinus tenderness  Throat:   Lips, mucosa, and tongue normal; teeth and gums normal  Neck:   Supple, symmetrical, trachea midline, no adenopathy;    Thyroid: no enlargement/tenderness/nodules  Back:     Symmetric, no curvature, ROM normal, no CVA tenderness  Lungs:     Clear to auscultation bilaterally, respirations unlabored  Chest Wall:    No tenderness or deformity   Heart:    Regular rate and rhythm, S1 and S2 normal, no murmur, rub   or gallop  Breast Exam:    Deferred to GYN  Abdomen:     Soft, non-tender, bowel sounds active all four quadrants,    no masses, no organomegaly  Genitalia:    Deferred to GYN  Rectal:    Extremities:   Extremities  normal, atraumatic, no cyanosis or edema  Pulses:   2+ and symmetric all extremities  Skin:   Skin color, texture, turgor normal, no rashes or lesions  Lymph nodes:   Cervical, supraclavicular, and axillary nodes normal  Neurologic:   CNII-XII intact, normal strength, sensation and reflexes    throughout          Assessment & Plan:

## 2016-08-06 NOTE — Progress Notes (Signed)
Called pt and lmovm to return call.

## 2016-08-06 NOTE — Assessment & Plan Note (Signed)
Pt has hx of this.  Check labs.  Replete prn. 

## 2016-08-06 NOTE — Progress Notes (Signed)
Pre visit review using our clinic review tool, if applicable. No additional management support is needed unless otherwise documented below in the visit note. 

## 2016-08-06 NOTE — Assessment & Plan Note (Signed)
Chronic problem.  Well controlled today.  Asymptomatic.  Check labs.  No anticipated med changes. 

## 2016-08-06 NOTE — Assessment & Plan Note (Signed)
Chronic problem.  Asymptomatic.  Check labs.  Adjust meds prn  

## 2016-08-06 NOTE — Patient Instructions (Signed)
Follow up in 6 months to recheck BP We'll notify you of your lab results and make any changes if needed Keep up the good work!  You look great! Call with any questions or concerns Enjoy the rest of your summer!

## 2016-08-10 ENCOUNTER — Telehealth: Payer: Self-pay | Admitting: Family Medicine

## 2016-08-10 NOTE — Telephone Encounter (Signed)
Daughter calling about pt appts ( AWV w/Kim & f/u w/KT) that she had last week, states that sometimes pt forgets to tell her everything and has some questions about medications that have been changed, daughter would like a call back

## 2016-08-11 NOTE — Telephone Encounter (Signed)
Do you think you might be able to call and speak with pt daughter since you did AWV

## 2016-08-11 NOTE — Telephone Encounter (Signed)
Spoke with daughter [name]. Reviewed lab results, new medications, and instructions from Dr. Birdie Riddle. No further questions at this time. Encouraged to call with any additional concerns.

## 2016-08-12 ENCOUNTER — Other Ambulatory Visit: Payer: Self-pay | Admitting: General Practice

## 2016-08-12 MED ORDER — ATENOLOL 25 MG PO TABS: 25.0000 mg | ORAL_TABLET | Freq: Every day | ORAL | 0 refills | Status: DC

## 2016-09-15 ENCOUNTER — Ambulatory Visit (INDEPENDENT_AMBULATORY_CARE_PROVIDER_SITE_OTHER): Payer: Medicare Other | Admitting: Family Medicine

## 2016-09-15 ENCOUNTER — Encounter: Payer: Self-pay | Admitting: Family Medicine

## 2016-09-15 VITALS — BP 120/83 | HR 58 | Temp 98.5°F | Resp 16 | Ht 62.0 in | Wt 170.4 lb

## 2016-09-15 DIAGNOSIS — R21 Rash and other nonspecific skin eruption: Secondary | ICD-10-CM | POA: Diagnosis not present

## 2016-09-15 MED ORDER — TRIAMCINOLONE ACETONIDE 0.1 % EX OINT: 1.0000 "application " | TOPICAL_OINTMENT | Freq: Two times a day (BID) | CUTANEOUS | 1 refills | Status: DC

## 2016-09-15 MED ORDER — PREDNISONE 10 MG PO TABS: ORAL_TABLET | ORAL | 0 refills | Status: DC

## 2016-09-15 MED ORDER — METHYLPREDNISOLONE ACETATE 80 MG/ML IJ SUSP
80.0000 mg | Freq: Once | INTRAMUSCULAR | Status: AC
  Administered 2016-09-15: 80 mg via INTRAMUSCULAR

## 2016-09-15 NOTE — Progress Notes (Signed)
   Subjective:    Patient ID: Kristina [name], female    DOB: 02-16-1931, 83 y.o.   MRN: [id number]  HPI Rash- first noticed ~1 week ago.  Spends a lot of time outside.  Very itchy.  Hands and feet not impacted.  Legs and trunk are most severe.  No recent travel or change in sleeping arrangements.  No pet exposures.  No household contacts w/ similar rashes.  No itching in groin.   Review of Systems For ROS see HPI     Objective:   Physical Exam  Constitutional: She is oriented to person, place, and time. She appears well-developed and well-nourished. No distress.  Neurological: She is alert and oriented to person, place, and time.  Skin: Skin is warm and dry. Rash (excoriated maculopapular areas on back, trunk, and legs bilaterally w/ scabs present.  no areas on hands/feet/groin) noted. No erythema.  Psychiatric: She has a normal mood and affect. Her behavior is normal. Thought content normal.  Vitals reviewed.         Assessment & Plan:  Rash- new.  Rash is not consistent w/ contact dermatitis and looks more consistent w/ bites.  Distribution is not consistent w/ scabies so will hold off on Elimite at this time.  Due to intense itching, will give Depo-Medrol in office and start steroid taper tomorrow.  Triamcinolone topically prn.  Reviewed supportive care and red flags that should prompt return.  Pt expressed understanding and is in agreement w/ plan.

## 2016-09-15 NOTE — Progress Notes (Signed)
Pre visit review using our clinic review tool, if applicable. No additional management support is needed unless otherwise documented below in the visit note. 

## 2016-09-15 NOTE — Patient Instructions (Signed)
Follow up as needed/scheduled Start the Prednisone pills tomorrow as directed- take 3 pills at the same time x3 days and then 2 pills x3 days, and then 1 tab x3 days Use the Triamcinolone ointment twice daily as needed on the itchy areas Try and avoid scratching Call with any questions or concerns Hang in there!!!

## 2016-09-15 NOTE — Addendum Note (Signed)
Addended by: Davis Gourd on: 09/15/2016 10:21 AM   Modules accepted: Orders

## 2016-09-22 ENCOUNTER — Other Ambulatory Visit: Payer: Self-pay | Admitting: Family Medicine

## 2016-09-29 ENCOUNTER — Encounter: Payer: Self-pay | Admitting: Gastroenterology

## 2016-10-26 ENCOUNTER — Ambulatory Visit: Payer: Medicare Other | Admitting: Nurse Practitioner

## 2016-10-29 ENCOUNTER — Ambulatory Visit: Payer: Medicare Other | Admitting: Nurse Practitioner

## 2016-10-29 ENCOUNTER — Other Ambulatory Visit: Payer: Self-pay | Admitting: Family Medicine

## 2016-10-29 DIAGNOSIS — E559 Vitamin D deficiency, unspecified: Secondary | ICD-10-CM

## 2016-11-23 ENCOUNTER — Other Ambulatory Visit: Payer: Self-pay | Admitting: Family Medicine

## 2016-11-23 DIAGNOSIS — E781 Pure hyperglyceridemia: Secondary | ICD-10-CM

## 2016-11-28 ENCOUNTER — Other Ambulatory Visit: Payer: Self-pay | Admitting: Family Medicine

## 2016-11-28 DIAGNOSIS — E559 Vitamin D deficiency, unspecified: Secondary | ICD-10-CM

## 2016-11-29 ENCOUNTER — Other Ambulatory Visit: Payer: Self-pay | Admitting: Family Medicine

## 2016-11-29 DIAGNOSIS — E559 Vitamin D deficiency, unspecified: Secondary | ICD-10-CM

## 2016-12-06 ENCOUNTER — Other Ambulatory Visit: Payer: Self-pay | Admitting: Family Medicine

## 2016-12-06 DIAGNOSIS — E559 Vitamin D deficiency, unspecified: Secondary | ICD-10-CM

## 2017-01-08 ENCOUNTER — Other Ambulatory Visit: Payer: Self-pay | Admitting: Neurology

## 2017-01-27 ENCOUNTER — Other Ambulatory Visit: Payer: Self-pay | Admitting: Family Medicine

## 2017-01-27 DIAGNOSIS — R921 Mammographic calcification found on diagnostic imaging of breast: Secondary | ICD-10-CM

## 2017-02-04 ENCOUNTER — Telehealth: Payer: Self-pay | Admitting: Family Medicine

## 2017-02-04 NOTE — Telephone Encounter (Signed)
Discussed notes below with patient's daughter.  She stated verbal understanding.

## 2017-02-04 NOTE — Telephone Encounter (Signed)
The only thing to watch for would be low BP from the double dose of Atenolol (this would usually present as dizziness upon standing or fatigue).  No med changes for later today- just resume normal dose tomorrow

## 2017-02-04 NOTE — Telephone Encounter (Signed)
Pt.'s daughter [name] called - reports pt. Accidentally took her morning medications twice. Daughter would like advise from Dr. Birdie Riddle - should she leave work and go keep an eye on her.

## 2017-02-04 NOTE — Telephone Encounter (Signed)
Patient's daughter stated that mom took double of the following medications: -Atenolol -Levothyroxine -Fenofibrate -Vit D -Namenda  She is asking if she needs to return back home to be with her, or if there is anything else they need to do or look out for.    She is also asking if they need to alter medications for the rest of the day or tomorrow.  Routed to PCP to advise.

## 2017-02-07 ENCOUNTER — Ambulatory Visit: Payer: Medicare Other | Admitting: Family Medicine

## 2017-02-07 ENCOUNTER — Encounter: Payer: Self-pay | Admitting: Family Medicine

## 2017-02-07 ENCOUNTER — Other Ambulatory Visit: Payer: Self-pay | Admitting: Family Medicine

## 2017-02-07 ENCOUNTER — Encounter: Payer: Self-pay | Admitting: General Practice

## 2017-02-07 ENCOUNTER — Other Ambulatory Visit: Payer: Self-pay | Admitting: Neurology

## 2017-02-07 VITALS — BP 120/78 | HR 68 | Temp 98.1°F | Resp 16 | Ht 62.0 in | Wt 169.5 lb

## 2017-02-07 DIAGNOSIS — R7989 Other specified abnormal findings of blood chemistry: Secondary | ICD-10-CM

## 2017-02-07 DIAGNOSIS — I10 Essential (primary) hypertension: Secondary | ICD-10-CM | POA: Diagnosis not present

## 2017-02-07 DIAGNOSIS — E039 Hypothyroidism, unspecified: Secondary | ICD-10-CM | POA: Diagnosis not present

## 2017-02-07 DIAGNOSIS — E781 Pure hyperglyceridemia: Secondary | ICD-10-CM | POA: Diagnosis not present

## 2017-02-07 DIAGNOSIS — Z86010 Personal history of colonic polyps: Secondary | ICD-10-CM

## 2017-02-07 LAB — HEPATIC FUNCTION PANEL
ALK PHOS: 25 U/L — AB (ref 39–117)
ALT: 11 U/L (ref 0–35)
AST: 18 U/L (ref 0–37)
Albumin: 3.9 g/dL (ref 3.5–5.2)
BILIRUBIN DIRECT: 0.1 mg/dL (ref 0.0–0.3)
BILIRUBIN TOTAL: 0.5 mg/dL (ref 0.2–1.2)
Total Protein: 7 g/dL (ref 6.0–8.3)

## 2017-02-07 LAB — LIPID PANEL
CHOL/HDL RATIO: 3
CHOLESTEROL: 163 mg/dL (ref 0–200)
HDL: 54.7 mg/dL (ref >39.00)
LDL CALC: 82 mg/dL (ref 0–99)
NonHDL: 108.19
Triglycerides: 130 mg/dL (ref 0.0–149.0)
VLDL: 26 mg/dL (ref 0.0–40.0)

## 2017-02-07 LAB — CBC WITH DIFFERENTIAL/PLATELET
Basophils Absolute: 0.1 10*3/uL (ref 0.0–0.1)
Basophils Relative: 0.8 % (ref 0.0–3.0)
EOS PCT: 1.4 % (ref 0.0–5.0)
Eosinophils Absolute: 0.2 10*3/uL (ref 0.0–0.7)
HCT: 38.4 % (ref 36.0–46.0)
Hemoglobin: 12.7 g/dL (ref 12.0–15.0)
LYMPHS ABS: 3.6 10*3/uL (ref 0.7–4.0)
Lymphocytes Relative: 34.7 % (ref 12.0–46.0)
MCHC: 33 g/dL (ref 30.0–36.0)
MCV: 96.9 fl (ref 78.0–100.0)
MONOS PCT: 6 % (ref 3.0–12.0)
Monocytes Absolute: 0.6 10*3/uL (ref 0.1–1.0)
NEUTROS ABS: 6 10*3/uL (ref 1.4–7.7)
NEUTROS PCT: 57.1 % (ref 43.0–77.0)
PLATELETS: 382 10*3/uL (ref 150.0–400.0)
RBC: 3.96 Mil/uL (ref 3.87–5.11)
RDW: 13.4 % (ref 11.5–15.5)
WBC: 10.4 10*3/uL (ref 4.0–10.5)

## 2017-02-07 LAB — BASIC METABOLIC PANEL
BUN: 21 mg/dL (ref 6–23)
CO2: 29 mEq/L (ref 19–32)
Calcium: 9.6 mg/dL (ref 8.4–10.5)
Chloride: 100 mEq/L (ref 96–112)
Creatinine, Ser: 1.24 mg/dL — ABNORMAL HIGH (ref 0.40–1.20)
GFR: 43.89 mL/min — AB (ref >60.00)
Glucose, Bld: 96 mg/dL (ref 70–99)
POTASSIUM: 4.5 meq/L (ref 3.5–5.1)
SODIUM: 139 meq/L (ref 135–145)

## 2017-02-07 LAB — TSH: TSH: 1.55 u[IU]/mL (ref 0.35–4.50)

## 2017-02-07 NOTE — Assessment & Plan Note (Signed)
Pt is tolerating fenofibrate w/o difficulty.  Check labs.  Adjust meds prn

## 2017-02-07 NOTE — Progress Notes (Signed)
   Subjective:    Patient ID: Kristina [name], female    DOB: 1931-08-12, 84 y.o.   MRN: [id number]  HPI HTN- chronic problem, on Atenolol 25mg  daily w/ good control.  No CP, SOB, HAs, visual changes, edema.  Hypothyroid- chronic problem, on Levothyroxine 31mcg daily  Hypertriglyceridemia- chronic problem, doing well on Fenofibrate daily.  No abd pain, N/V.  Colon adenoma- pt had colonoscopy in 2015 and was placed on a 3 year recall.  She got a letter indicating they would like an appt to discuss.  Pt denies bowel changes or concerns.  Pt reports she would not be interested in treating colon cancer at this point so she doesn't want to proceed w/ screening.  Review of Systems For ROS see HPI     Objective:   Physical Exam  Constitutional: She appears well-developed and well-nourished. No distress.  HENT:  Head: Normocephalic and atraumatic.  Eyes: Conjunctivae and EOM are normal. Pupils are equal, round, and reactive to light.  Neck: Normal range of motion. Neck supple. No thyromegaly present.  Cardiovascular: Normal rate, regular rhythm, normal heart sounds and intact distal pulses.  No murmur heard. Pulmonary/Chest: Effort normal and breath sounds normal. No respiratory distress.  Abdominal: Soft. She exhibits no distension. There is no tenderness.  Musculoskeletal: She exhibits no edema.  Lymphadenopathy:    She has no cervical adenopathy.  Neurological: She is alert.  Memory difficulties when answering questions  Skin: Skin is warm and dry.  Psychiatric: She has a normal mood and affect. Her behavior is normal.  Vitals reviewed.         Assessment & Plan:

## 2017-02-07 NOTE — Assessment & Plan Note (Signed)
Chronic problem.  Currently asymptomatic.  Check labs.  Adjust meds prn  

## 2017-02-07 NOTE — Patient Instructions (Signed)
Schedule your complete physical in 6 months and your Medicare Wellness at the same time [facility] notify you of your lab results and make any changes if needed Keep up the good work!  You look great! Call with any questions or concerns Happy New Year!

## 2017-02-07 NOTE — Assessment & Plan Note (Signed)
Chronic problem.  Well controlled today.  Asymptomatic.  Check labs.  No anticipated med changes.  Will follow. 

## 2017-02-07 NOTE — Assessment & Plan Note (Signed)
Pt was due for repeat colonoscopy last year and was sent a letter asking her to schedule an appt.  She did not schedule and she is not interested at this time.  She doesn't feel that she needs another colonoscopy as she is not having any issues and she doesn't think she would treat any colon cancer at this time if it was found.  Will forward note to Dr Fuller Plan.

## 2017-02-10 ENCOUNTER — Telehealth: Payer: Self-pay | Admitting: Family Medicine

## 2017-02-10 NOTE — Telephone Encounter (Signed)
Copied from [place] [phone]. Topic: General - Other >> Feb 10, 2017 10:18 AM Aurelio Brash B wrote: Reason for CRM: Daughter is asking for a call back to  talk about her mothers apt on Monday  1/28  as her mother doesn't always remember everything     [phone]

## 2017-02-11 ENCOUNTER — Telehealth: Payer: Self-pay | Admitting: *Deleted

## 2017-02-11 NOTE — Telephone Encounter (Signed)
Called and spoke with pt daughter, apologized that I did not receive first message. Went over labs and office notes. Pt daughter advised that mom did not make mention of some left rib pain. I advised to try alternating heat/ice as there was no reported injury. If not any better pt is suppose to contact office to be seen.

## 2017-02-11 NOTE — Telephone Encounter (Signed)
Copied from [place] [phone]. Topic: Inquiry >> Feb 11, 2017  3:43 PM Moton, Claiborne [place], [place] wrote: Reason for CRM: Patients daughter calling because she is still waiting for a call back from [place]. Stated that her mother was seen in the office this Monday and she has a few questions about that. If she could give her a call back at [phone]

## 2017-02-14 ENCOUNTER — Other Ambulatory Visit (INDEPENDENT_AMBULATORY_CARE_PROVIDER_SITE_OTHER): Payer: Medicare Other

## 2017-02-14 ENCOUNTER — Encounter: Payer: Self-pay | Admitting: General Practice

## 2017-02-14 DIAGNOSIS — R7989 Other specified abnormal findings of blood chemistry: Secondary | ICD-10-CM

## 2017-02-14 LAB — BASIC METABOLIC PANEL
BUN: 23 mg/dL (ref 6–23)
CALCIUM: 9.2 mg/dL (ref 8.4–10.5)
CO2: 30 meq/L (ref 19–32)
Chloride: 99 mEq/L (ref 96–112)
Creatinine, Ser: 1.17 mg/dL (ref 0.40–1.20)
GFR: 46.93 mL/min — AB (ref >60.00)
Glucose, Bld: 91 mg/dL (ref 70–99)
Potassium: 4.4 mEq/L (ref 3.5–5.1)
SODIUM: 137 meq/L (ref 135–145)

## 2017-02-14 NOTE — Telephone Encounter (Signed)
Noted.  Agree w/ advice given 

## 2017-02-25 ENCOUNTER — Ambulatory Visit
Admission: RE | Admit: 2017-02-25 | Discharge: 2017-02-25 | Disposition: A | Discharge status: Home / Self Care | Payer: Medicare Other | Source: Ambulatory Visit | Attending: Family Medicine | Admitting: Family Medicine

## 2017-02-25 ENCOUNTER — Other Ambulatory Visit: Payer: Self-pay | Admitting: Family Medicine

## 2017-02-25 DIAGNOSIS — R921 Mammographic calcification found on diagnostic imaging of breast: Secondary | ICD-10-CM | POA: Diagnosis not present

## 2017-03-17 ENCOUNTER — Other Ambulatory Visit: Payer: Self-pay | Admitting: Family Medicine

## 2017-03-17 ENCOUNTER — Ambulatory Visit: Payer: Medicare Other | Admitting: Family Medicine

## 2017-03-17 ENCOUNTER — Encounter: Payer: Self-pay | Admitting: Family Medicine

## 2017-03-17 ENCOUNTER — Other Ambulatory Visit: Payer: Self-pay

## 2017-03-17 VITALS — BP 110/64 | HR 55 | Temp 97.9°F | Resp 14 | Ht 62.0 in | Wt 170.4 lb

## 2017-03-17 DIAGNOSIS — L03119 Cellulitis of unspecified part of limb: Secondary | ICD-10-CM

## 2017-03-17 DIAGNOSIS — E781 Pure hyperglyceridemia: Secondary | ICD-10-CM

## 2017-03-17 MED ORDER — SULFAMETHOXAZOLE-TRIMETHOPRIM 800-160 MG PO TABS: 1.0000 | ORAL_TABLET | Freq: Two times a day (BID) | ORAL | 0 refills | Status: DC

## 2017-03-17 NOTE — Patient Instructions (Signed)
Please follow up if symptoms do not improve or as needed.    Cellulitis, Adult Cellulitis is a skin infection. The infected area is usually red and sore. This condition occurs most often in the arms and lower legs. It is very important to get treated for this condition. Follow these instructions at home:  Take over-the-counter and prescription medicines only as told by your doctor.  If you were prescribed an antibiotic medicine, take it as told by your doctor. Do not stop taking the antibiotic even if you start to feel better.  Drink enough fluid to keep your pee (urine) clear or pale yellow.  Do not touch or rub the infected area.  Raise (elevate) the infected area above the level of your heart while you are sitting or lying down.  Place warm or cold wet cloths (warm or cold compresses) on the infected area. Do this as told by your doctor.  Keep all follow-up visits as told by your doctor. This is important. These visits let your doctor make sure your infection is not getting worse. Contact a doctor if:  You have a fever.  Your symptoms do not get better after 1-2 days of treatment.  Your bone or joint under the infected area starts to hurt after the skin has healed.  Your infection comes back. This can happen in the same area or another area.  You have a swollen bump in the infected area.  You have new symptoms.  You feel ill and also have muscle aches and pains. Get help right away if:  Your symptoms get worse.  You feel very sleepy.  You throw up (vomit) or have watery poop (diarrhea) for a long time.  There are red streaks coming from the infected area.  Your red area gets larger.  Your red area turns darker. This information is not intended to replace advice given to you by your health care provider. Make sure you discuss any questions you have with your health care provider. Document Released: 06/16/2007 Document Revised: 06/05/2015 Document Reviewed:  11/06/2014 Elsevier Interactive Patient Education  2018 [employer].

## 2017-03-17 NOTE — Progress Notes (Signed)
Subjective  CC:  Chief Complaint  Patient presents with  . Foot Swelling    left foot is swollen and feels tight    HPI: Kristina [name] is a 84 y.o. female who presents to the office today to address the problems listed above in the chief complaint.  2-3 day h/o redness and mild soreness and swelling on top of left foot. No injury. "feels warm". Has dry flaking feet. Not painful with walking. No associated sxs. No f/c/s. No calf pain or leg swelling.    I reviewed the patients updated PMH, FH, and SocHx.    Patient Active Problem List   Diagnosis Date Noted  . Hypertriglyceridemia 02/07/2017  . Vitamin D deficiency 08/06/2016  . Physical exam 08/06/2016  . Abnormality of gait 04/23/2016  . Hypothyroidism 02/05/2016  . HTN (hypertension) 02/05/2016  . Mild cognitive impairment 09/23/2015  . Paresthesia 01/28/2014  . Hyperreflexia 01/28/2014  . ABDOMINAL PAIN-RUQ 09/20/2008  . GERD 01/16/2008  . OTHER POSTOPERATIVE FUNCTIONAL DISORDERS 01/16/2008  . DIARRHEA 01/16/2008  . TUBULOVILLOUS ADENOMA, COLON, HX OF 01/10/2008   Current Meds  Medication Sig  . atenolol (TENORMIN) 25 MG tablet Take 1 tablet (25 mg total) by mouth daily.  . [employer] D3 2000 units CAPS TAKE 1 CAPSULE BY MOUTH EVERY DAY  . diphenhydrAMINE (BENADRYL) 25 MG tablet Take 2 tablets (50 mg total) by mouth every 6 (six) hours as needed for itching (or hives).  . EPINEPHrine 0.3 mg/0.3 mL IJ SOAJ injection Inject 0.3 mLs (0.3 mg total) into the muscle once as needed (severe allergic reaction).  . fenofibrate 160 MG tablet TAKE 1 TABLET BY MOUTH EVERY DAY  . levothyroxine (SYNTHROID, LEVOTHROID) 75 MCG tablet TAKE 1 TABLET (75 MCG TOTAL) BY MOUTH DAILY.  Marland Kitchen loperamide (IMODIUM) 2 MG capsule Take 2 mg by mouth at bedtime.   . memantine (NAMENDA) 10 MG tablet TAKE 1 TABLET BY MOUTH TWICE A DAY. SCHEDULE FOLLOW UP APPT. BEFORE ANY MORE REFILLS  . naproxen sodium (ANAPROX) 220 MG tablet Take 220 mg by mouth at bedtime.    . triamcinolone ointment (KENALOG) 0.1 % Apply 1 application topically 2 (two) times daily.    Allergies: Patient is allergic to bee venom and penicillins. Family History: Patient family history includes Breast cancer (age of onset: 40) in her sister; Cancer in her brother; Dementia in her mother; Heart attack in her father; Heart disease in her sister. Social History:  Patient  reports that  has never smoked. she has never used smokeless tobacco. She reports that she does not drink alcohol or use drugs.  Review of Systems: Constitutional: Negative for fever malaise or anorexia Cardiovascular: negative for chest pain Respiratory: negative for SOB or persistent cough Gastrointestinal: negative for abdominal pain Wt Readings from Last 3 Encounters:  03/17/17 170 lb 6.4 oz (77.3 kg)  02/07/17 169 lb 8 oz (76.9 kg)  09/15/16 170 lb 6 oz (77.3 kg)    Objective  Vitals: BP 110/64   Pulse (!) 55   Temp 97.9 F (36.6 C) (Oral)   Resp 14   Ht 5\' 2"  (1.575 m)   Wt 170 lb 6.4 oz (77.3 kg)   SpO2 92%   BMI 31.17 kg/m  General: no acute distress , A&Ox3 HEENT: PEERL, conjunctiva normal, Oropharynx moist,neck is supple Cardiovascular:  RRR without murmur or gallop.  Respiratory:  Good breath sounds bilaterally, CTAB with normal respiratory effort Skin:  Warm, dorsal left foot with irritated seb derm lesion and  surrounding redness with minimal warmth and mild ttp; no fluctuance or mass, no joint swelling, nl gait, no cords, calf pain or leg edema. No flaking rash  Assessment  1. Cellulitis of foot      Plan   May be mild cellulitis:  Septra x 7days, education and f/u if not improving. Recommend lotion to help with dry skin.   Follow up: Return if symptoms worsen or fail to improve.    Commons side effects, risks, benefits, and alternatives for medications and treatment plan prescribed today were discussed, and the patient expressed understanding of the given instructions. Patient  is instructed to call or message via MyChart if he/she has any questions or concerns regarding our treatment plan. No barriers to understanding were identified. We discussed Red Flag symptoms and signs in detail. Patient expressed understanding regarding what to do in case of urgent or emergency type symptoms.   Medication list was reconciled, printed and provided to the patient in AVS. Patient instructions and summary information was reviewed with the patient as documented in the AVS. This note was prepared with assistance of Dragon voice recognition software. Occasional wrong-word or sound-a-like substitutions may have occurred due to the inherent limitations of voice recognition software  No orders of the defined types were placed in this encounter.  Meds ordered this encounter  Medications  . sulfamethoxazole-trimethoprim (BACTRIM DS,SEPTRA DS) 800-160 MG tablet    Sig: Take 1 tablet by mouth 2 (two) times daily.    Dispense:  14 tablet    Refill:  0

## 2017-03-18 ENCOUNTER — Other Ambulatory Visit: Payer: Self-pay | Admitting: Family Medicine

## 2017-03-18 DIAGNOSIS — E559 Vitamin D deficiency, unspecified: Secondary | ICD-10-CM

## 2017-03-20 ENCOUNTER — Other Ambulatory Visit: Payer: Self-pay | Admitting: Family Medicine

## 2017-04-05 ENCOUNTER — Encounter: Payer: Self-pay | Admitting: Physician Assistant

## 2017-04-05 ENCOUNTER — Other Ambulatory Visit: Payer: Self-pay

## 2017-04-05 ENCOUNTER — Ambulatory Visit: Payer: Medicare Other | Admitting: Physician Assistant

## 2017-04-05 VITALS — BP 102/62 | HR 52 | Temp 97.7°F | Resp 16 | Ht 62.0 in | Wt 168.4 lb

## 2017-04-05 DIAGNOSIS — R200 Anesthesia of skin: Secondary | ICD-10-CM

## 2017-04-05 DIAGNOSIS — L810 Postinflammatory hyperpigmentation: Secondary | ICD-10-CM | POA: Diagnosis not present

## 2017-04-05 NOTE — Patient Instructions (Addendum)
No sign of cellulitis on today's examination! There is some darkening of the skin on this foot but can be related to recent infection. We will watch over time since there is no swelling, coolness/warmth, and your pulses are great! Make sure to keep feet clean and dry. Lotion daily. Apply an OTC athletes foot lotion to the feet for 2 weeks.   For the hands this seems like a bit of radial nerve irritation and should calm down. If you note any development of pain or new symptoms, or if the intermittent tingling sensation is not improving, please call or come see [place] and we will need a further assessment.

## 2017-04-05 NOTE — Progress Notes (Signed)
Patient presents to clinic today c/o a numbness sensation in dorsum of R hand noted over the past couple of days. Is usually there on waking in the morning and resolves once she gets up and moving. Notes occasional symptom of L hand on waking but this is very rare. Denies any trauma or injury. Denies swelling, bruising, redness. Denies decreased ROM. No symptoms present currently per patient.   Patient also seen 3 weeks ago for mild cellulitis of L foot. Was put on 1 week of Bactrim DS. Did complete regimen as directed. Notes resolution of tenderness and hardness of skin. Still noting some darker skin in the area. Denies any new or worsening concerns related to this.   Past Medical History:  Diagnosis Date  . Benign paroxysmal positional vertigo   . Diverticulosis   . Duodenal ulcer   . Endometrial cancer [facility])    age 84  . GERD (gastroesophageal reflux disease)   . Hypertension   . Hypothyroidism   . Memory loss   . Peripheral neuropathy   . Tubulovillous adenoma polyp of colon 04/1998    Current Outpatient Medications on File Prior to Visit  Medication Sig Dispense Refill  . atenolol (TENORMIN) 25 MG tablet Take 1 tablet (25 mg total) by mouth daily. 90 tablet 0  . [facility]S D3 2000 units CAPS TAKE 1 CAPSULE BY MOUTH EVERY DAY 100 capsule 0  . diphenhydrAMINE (BENADRYL) 25 MG tablet Take 2 tablets (50 mg total) by mouth every 6 (six) hours as needed for itching (or hives). 60 tablet 0  . EPINEPHrine 0.3 mg/0.3 mL IJ SOAJ injection Inject 0.3 mLs (0.3 mg total) into the muscle once as needed (severe allergic reaction). 1 Device 1  . fenofibrate 160 MG tablet TAKE 1 TABLET BY MOUTH EVERY DAY 30 tablet 3  . levothyroxine (SYNTHROID, LEVOTHROID) 75 MCG tablet TAKE 1 TABLET BY MOUTH EVERY DAY 90 tablet 1  . loperamide (IMODIUM) 2 MG capsule Take 2 mg by mouth at bedtime.     . memantine (NAMENDA) 10 MG tablet TAKE 1 TABLET BY MOUTH TWICE A DAY. SCHEDULE FOLLOW UP APPT. BEFORE ANY MORE REFILLS  60 tablet 2  . naproxen sodium (ANAPROX) 220 MG tablet Take 220 mg by mouth at bedtime.     . triamcinolone ointment (KENALOG) 0.1 % Apply 1 application topically 2 (two) times daily. 90 g 1  . sulfamethoxazole-trimethoprim (BACTRIM DS,SEPTRA DS) 800-160 MG tablet Take 1 tablet by mouth 2 (two) times daily. (Patient not taking: Reported on 04/05/2017) 14 tablet 0   No current facility-administered medications on file prior to visit.     Allergies  Allergen Reactions  . Bee Venom Shortness Of Breath, Itching and Rash    Almost passed out also  . Penicillins Rash    Has patient had a PCN reaction causing immediate rash, facial/tongue/throat swelling, SOB or lightheadedness with hypotension: Yes Has patient had a PCN reaction causing severe rash involving mucus membranes or skin necrosis: No Has patient had a PCN reaction that required hospitalization: Unknown Has patient had a PCN reaction occurring within the last 10 years: No If all of the above answers are "NO", then may proceed with Cephalosporin use.     Family History  Problem Relation Age of Onset  . Dementia Mother   . Heart attack Father   . Heart disease Sister   . Breast cancer Sister 55  . Cancer Brother   . Colon cancer Neg Hx     Social  History   Socioeconomic History  . Marital status: Married    Spouse name: Not on file  . Number of children: 3  . Years of education: 12  . Highest education level: Not on file  Occupational History  . Occupation: Retired  Scientific laboratory technician  . Financial resource strain: Not on file  . Food insecurity:    Worry: Not on file    Inability: Not on file  . Transportation needs:    Medical: Not on file    Non-medical: Not on file  Tobacco Use  . Smoking status: Never Smoker  . Smokeless tobacco: Never Used  Substance and Sexual Activity  . Alcohol use: No    Alcohol/week: 0.0 oz  . Drug use: No  . Sexual activity: Not on file  Lifestyle  . Physical activity:    Days per  week: Not on file    Minutes per session: Not on file  . Stress: Not on file  Relationships  . Social connections:    Talks on phone: Not on file    Gets together: Not on file    Attends religious service: Not on file    Active member of club or organization: Not on file    Attends meetings of clubs or organizations: Not on file    Relationship status: Not on file  Other Topics Concern  . Not on file  Social History Narrative   Lives with her daughter, [name].   Widowed.   Retired.   Right hand.   Three children.   High school education.   2 cups coffee daily.    Review of Systems - See HPI.  All other ROS are negative.  BP 102/62   Pulse (!) 52   Temp 97.7 F (36.5 C) (Oral)   Resp 16   Ht 5\' 2"  (1.575 m)   Wt 168 lb 6.4 oz (76.4 kg)   SpO2 97%   BMI 30.80 kg/m   Physical Exam  Constitutional: She is oriented to person, place, and time and well-developed, well-nourished, and in no distress.  HENT:  Head: Normocephalic and atraumatic.  Eyes: Conjunctivae are normal.  Neck: Neck supple.  Cardiovascular: Normal rate, regular rhythm, normal heart sounds and intact distal pulses.  Pulses:      Popliteal pulses are 2+ on the right side, and 2+ on the left side.       Dorsalis pedis pulses are 2+ on the right side, and 2+ on the left side.       Posterior tibial pulses are 2+ on the right side, and 2+ on the left side.  No LE edema noted on examination. Good temperature of feet bilaterally. No discrepancy from one side to the other noted.  Pulmonary/Chest: Effort normal and breath sounds normal. No respiratory distress. She has no wheezes. She has no rales. She exhibits no tenderness.  Musculoskeletal:       Right hand: Normal. Normal sensation noted. Normal strength noted.       Left hand: Normal. Normal sensation noted. Normal strength noted.       Feet:  Neurological: She is alert and oriented to person, place, and time.  Skin: Skin is warm and dry. No rash  noted.  Vitals reviewed.   Recent Results (from the past 2160 hour(s))  Lipid panel     Status: None   Collection Time: 02/07/17 11:08 AM  Result Value Ref Range   Cholesterol 163 0 - 200 mg/dL    Comment: ATP  III Classification       Desirable:  < 200 mg/dL               Borderline High:  200 - 239 mg/dL          High:  > = 240 mg/dL   Triglycerides 130.0 0.0 - 149.0 mg/dL    Comment: Normal:  <150 mg/dLBorderline High:  150 - 199 mg/dL   HDL 54.70 >39.00 mg/dL   VLDL 26.0 0.0 - 40.0 mg/dL   LDL Cholesterol 82 0 - 99 mg/dL   Total CHOL/HDL Ratio 3     Comment:                Men          Women1/2 Average Risk     3.4          3.3Average Risk          5.0          4.42X Average Risk          9.6          7.13X Average Risk          15.0          11.0                       NonHDL 108.19     Comment: NOTE:  Non-HDL goal should be 30 mg/dL higher than patient's LDL goal (i.e. LDL goal of < 70 mg/dL, would have non-HDL goal of < 100 mg/dL)  Basic metabolic panel     Status: Abnormal   Collection Time: 02/07/17 11:08 AM  Result Value Ref Range   Sodium 139 135 - 145 mEq/L   Potassium 4.5 3.5 - 5.1 mEq/L   Chloride 100 96 - 112 mEq/L   CO2 29 19 - 32 mEq/L   Glucose, Bld 96 70 - 99 mg/dL   BUN 21 6 - 23 mg/dL   Creatinine, Ser 1.24 (H) 0.40 - 1.20 mg/dL   Calcium 9.6 8.4 - 10.5 mg/dL   GFR 43.89 (L) >60.00 mL/min  Hepatic function panel     Status: Abnormal   Collection Time: 02/07/17 11:08 AM  Result Value Ref Range   Total Bilirubin 0.5 0.2 - 1.2 mg/dL   Bilirubin, Direct 0.1 0.0 - 0.3 mg/dL   Alkaline Phosphatase 25 (L) 39 - 117 U/L   AST 18 0 - 37 U/L   ALT 11 0 - 35 U/L   Total Protein 7.0 6.0 - 8.3 g/dL   Albumin 3.9 3.5 - 5.2 g/dL  TSH     Status: None   Collection Time: 02/07/17 11:08 AM  Result Value Ref Range   TSH 1.55 0.35 - 4.50 uIU/mL  CBC with Differential/Platelet     Status: None   Collection Time: 02/07/17 11:08 AM  Result Value Ref Range   WBC 10.4 4.0 -  10.5 K/uL   RBC 3.96 3.87 - 5.11 Mil/uL   Hemoglobin 12.7 12.0 - 15.0 g/dL   HCT 38.4 36.0 - 46.0 %   MCV 96.9 78.0 - 100.0 fl   MCHC 33.0 30.0 - 36.0 g/dL   RDW 13.4 11.5 - 15.5 %   Platelets 382.0 150.0 - 400.0 K/uL   Neutrophils Relative % 57.1 43.0 - 77.0 %   Lymphocytes Relative 34.7 12.0 - 46.0 %   Monocytes Relative 6.0 3.0 - 12.0 %   Eosinophils Relative 1.4 0.0 - 5.0 %  Basophils Relative 0.8 0.0 - 3.0 %   Neutro Abs 6.0 1.4 - 7.7 K/uL   Lymphs Abs 3.6 0.7 - 4.0 K/uL   Monocytes Absolute 0.6 0.1 - 1.0 K/uL   Eosinophils Absolute 0.2 0.0 - 0.7 K/uL   Basophils Absolute 0.1 0.0 - 0.1 K/uL  Basic metabolic panel     Status: Abnormal   Collection Time: 02/14/17 10:21 AM  Result Value Ref Range   Sodium 137 135 - 145 mEq/L   Potassium 4.4 3.5 - 5.1 mEq/L   Chloride 99 96 - 112 mEq/L   CO2 30 19 - 32 mEq/L   Glucose, Bld 91 70 - 99 mg/dL   BUN 23 6 - 23 mg/dL   Creatinine, Ser 1.17 0.40 - 1.20 mg/dL   Calcium 9.2 8.4 - 10.5 mg/dL   GFR 46.93 (L) >60.00 mL/min    Assessment/Plan: 1. Postinflammatory hyperpigmentation At site of prior cellulitis. No sign of infection present. Discussed supportive measures. Great pulses. Extremity is neurovascularly intact. This should hopefully improved the further out from infection we get.   2. Numbness of right hand Intermittent. Usually on waking. Exam unremarkable today. Likely positional. Discussed sleep positioning. If becoming more frequent or severe, can consider NCS.    Leeanne Rio, PA-C

## 2017-04-07 NOTE — Progress Notes (Signed)
[facility]  PATIENT: Kristina [name] DOB: 06-28-1931   REASON FOR VISIT: Follow-up for memory loss HISTORY FROM: Patient and son     HISTORY OF PRESENT ILLNESS: HISTORY Kristina [name] is 84 years old right-handed female, accompanied by her daughter Kristina [name],  seen in refer byher primary care physicians Dr. Tollie Pizza for evaluation of memory loss on September 23 2015   I reviewed and summarized the referring note, she had a history of hypertension, impaired fasting glucose, A1c was 6.5, hypothyroidism, She also had a history of endometrial cancer in May 04, 1998, was treated with hysterectomy, but did not require chemotherapy or radiation therapy, also reported a history of duodenal ulcer many years ago, peripheral neuropathy, numbness in her toes for many years, I evaluate her in January 2016, EMG nerve conduction study then showed no large fiber peripheral neuropathy or lumbar sacral radiculopathy.   I have personally reviewed MRI cervical spine in February 2016, which showed noticeable cervical spondylosis at C5-6, with mild to moderate canal stenosis, but no cord deformity or signal change.   She has hyperreflexia on examination, she denies significant gait abnormality, no bowel and bladder incontinence, she does has occasionally diarrhea, urgency, she denies significant low back pain.     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  Her husband passed away in May 04, 2010.   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  She has bilateral knee pain, take aleve every night, sleeps well, she was noted to have less appetite, lost some weight,  She still prepare meal every Sunday, cooking for 6 people.  She  does realized that she forget things, names, misplace things, she tends to repeat questions,  She forgot what disease her husband died from. She still drives, but got lost sometimes.  Her mother does become forgetful in her 57s.    She did have lab evaluation 6 months ago. There was no significant abnormality found  UPDATE Oct 23 2015:YY We have personally reviewed MRI of brain without contrast in October 2017, generalized atrophy, especially at the perisyalvian fissure region, mild small vessel disease, chronic left maxillary sphenoid sinusitis She likes to read,but sedentary lifestyle Laboratory evaluation showed normal TSH, B12, CMP,mild elevated C reactive protein, WBC 11 point 7, UPDATE 04/13/2018[name], 84 year old female returns for follow-up. She has a history of memory loss was placed on Namenda at her last visit. She denies any side effects to the medication. She thinks her memory is stable. She continues to be independent in activities of daily living except that her daughter does her finances. She continues to drive without getting lost. She does not exercise. She denies any falls or balance issues . No safety issues identified. She returns for reevaluation UPDATE 4/1/2019[name], 84 year old female returns for follow-up with her daughter.  She has a history of memory loss and is currently on Namenda 10 mg twice daily.  Her memory score is stable.  She denies side effects to the medication.  She remains active and mows her grass, does errands in the car locally.  Does not drive on the highway.  She continues to Psychologist, occupational at [employer].  Appetite is good and she is  sleeping well.  She remains independent in activities of daily living except that the daughter does her finances.  No falls or balance issues.  No safety issues identified.  She returns for reevaluation  REVIEW OF SYSTEMS: Full 14 system review of systems performed and notable only for those listed, all  others are neg:  Constitutional: neg  Cardiovascular: neg Ear/Nose/Throat: neg  Skin: neg Eyes: neg Respiratory: neg Gastroitestinal: neg  Hematology/Lymphatic: neg  Endocrine: neg Musculoskeletal: Joint pain Allergy/Immunology: neg Neurological: Memory loss Psychiatric: neg Sleep : neg   ALLERGIES: Allergies  Allergen Reactions  . Bee Venom Shortness Of Breath, Itching and Rash    Almost passed out also  . Penicillins Rash    Has patient had a PCN reaction causing immediate rash, facial/tongue/throat swelling, SOB or lightheadedness with hypotension: Yes Has patient had a PCN reaction causing severe rash involving mucus membranes or skin necrosis: No Has patient had a PCN reaction that required hospitalization: Unknown Has patient had a PCN reaction occurring within the last 10 years: No If all of the above answers are "NO", then may proceed with Cephalosporin use.     HOME MEDICATIONS: Outpatient Medications Prior to Visit  Medication Sig Dispense Refill  . atenolol (TENORMIN) 25 MG tablet Take 1 tablet (25 mg total) by mouth daily. 90 tablet 0  . CVS D3 2000 units CAPS TAKE 1 CAPSULE BY MOUTH EVERY DAY 100 capsule 0  . diphenhydrAMINE (BENADRYL) 25 MG tablet Take 2 tablets (50 mg total) by mouth every 6 (six) hours as needed for itching (or hives). 60 tablet 0  . EPINEPHrine 0.3 mg/0.3 mL IJ SOAJ injection Inject 0.3 mLs (0.3 mg total) into the muscle once as needed (severe allergic reaction). 1 Device 1  . fenofibrate 160 MG tablet TAKE 1 TABLET BY MOUTH EVERY DAY 30 tablet 3  . levothyroxine (SYNTHROID, LEVOTHROID) 75 MCG tablet TAKE 1 TABLET BY MOUTH EVERY DAY 90 tablet 1  . loperamide (IMODIUM) 2 MG capsule Take 2 mg by mouth at bedtime.     . memantine (NAMENDA) 10 MG tablet TAKE 1 TABLET BY MOUTH TWICE A DAY. SCHEDULE FOLLOW UP APPT. BEFORE ANY MORE REFILLS 60 tablet 2  . naproxen sodium (ANAPROX) 220 MG tablet Take 220 mg by mouth at bedtime.     . triamcinolone  ointment (KENALOG) 0.1 % Apply 1 application topically 2 (two) times daily. 90 g 1   No facility-administered medications prior to visit.     PAST MEDICAL HISTORY: Past Medical History:  Diagnosis Date  . Benign paroxysmal positional vertigo   . Diverticulosis   . Duodenal ulcer   . Endometrial cancer [facility])    age 89  . GERD (gastroesophageal reflux disease)   . Hypertension   . Hypothyroidism   . Memory loss   . Peripheral neuropathy   . Tubulovillous adenoma polyp of colon 04/1998    PAST SURGICAL HISTORY: Past Surgical History:  Procedure Laterality Date  . CATARACT EXTRACTION    . CHOLECYSTECTOMY    . FOOT SURGERY     Right  . KNEE SURGERY     Right  . VAGINAL HYSTERECTOMY      FAMILY HISTORY: Family History  Problem Relation Age of Onset  . Dementia Mother   . Heart attack Father   . Heart disease Sister   . Breast cancer Sister 17  . Cancer Brother   . Colon cancer Neg Hx     SOCIAL HISTORY: Social History   Socioeconomic  History  . Marital status: Married    Spouse name: Not on file  . Number of children: 3  . Years of education: 23  . Highest education level: Not on file  Occupational History  . Occupation: Retired  Scientific laboratory technician  . Financial resource strain: Not on file  . Food insecurity:    Worry: Not on file    Inability: Not on file  . Transportation needs:    Medical: Not on file    Non-medical: Not on file  Tobacco Use  . Smoking status: Never Smoker  . Smokeless tobacco: Never Used  Substance and Sexual Activity  . Alcohol use: No    Alcohol/week: 0.0 oz  . Drug use: No  . Sexual activity: Not on file  Lifestyle  . Physical activity:    Days per week: Not on file    Minutes per session: Not on file  . Stress: Not on file  Relationships  . Social connections:    Talks on phone: Not on file    Gets together: Not on file    Attends religious service: Not on file    Active member of club or organization: Not on file     Attends meetings of clubs or organizations: Not on file    Relationship status: Not on file  . Intimate partner violence:    Fear of current or ex partner: Not on file    Emotionally abused: Not on file    Physically abused: Not on file    Forced sexual activity: Not on file  Other Topics Concern  . Not on file  Social History Narrative   Lives with her daughter, [name].   Widowed.   Retired.   Right hand.   Three children.   High school education.   2 cups coffee daily.     PHYSICAL EXAM  Vitals:   04/11/17 0754  BP: (!) 122/55  Pulse: 60  Weight: 168 lb 12.8 oz (76.6 kg)  Height: 5\' 2"  (1.575 m)   Body mass index is 30.87 kg/m.  Generalized: Well developed, obese female in no acute distress , well-groomed Head: normocephalic and atraumatic,. Oropharynx benign  Neck: Supple,  Musculoskeletal: No deformity   Neurological examination   Mentation: Alert Follows all commands speech and language fluent.  MMSE - Mini Mental State Exam 04/11/2017 08/05/2016 04/23/2016  Orientation to time 5 5 3   Orientation to Place 4 5 5   Registration 3 3 3   Attention/ Calculation 5 5 5   Recall 1 1 1   Language- name 2 objects 2 2 2   Language- repeat 1 1 0  Language- follow 3 step command 2 3 3   Language- read & follow direction 1 1 1   Write a sentence 1 1 1   Copy design 1 1 1   Total score 26 28 25     Cranial nerve II-XII: Fundoscopic exam reveals sharp disc margins.Pupils were equal round reactive to light extraocular movements were full, visual field were full on confrontational test. Facial sensation and strength were normal. hearing was intact to finger rubbing bilaterally. Uvula tongue midline. head turning and shoulder shrug were normal and symmetric.Tongue protrusion into cheek strength was normal. Motor: normal bulk and tone, full strength in the BUE, BLE,  Sensory: Length dependent decreased light touch pinprick and vibratory sensation at toes  Coordination:  finger-nose-finger, heel-to-shin bilaterally, no dysmetria Reflexes: Symmetric upper and lower, plantar responses were flexor bilaterally. Gait and Station: Rising up from seated position with push off cautious gait  no assistive device. Can heel and toe walk Tandem gait is unsteady  DIAGNOSTIC DATA (LABS, IMAGING, TESTING) - I reviewed patient records, labs, notes, testing and imaging myself where available.  Lab Results  Component Value Date   WBC 10.4 02/07/2017   HGB 12.7 02/07/2017   HCT 38.4 02/07/2017   MCV 96.9 02/07/2017   PLT 382.0 02/07/2017      Component Value Date/Time   NA 137 02/14/2017 1021   NA 141 09/23/2015 0959   K 4.4 02/14/2017 1021   CL 99 02/14/2017 1021   CO2 30 02/14/2017 1021   GLUCOSE 91 02/14/2017 1021   BUN 23 02/14/2017 1021   BUN 11 09/23/2015 0959   CREATININE 1.17 02/14/2017 1021   CALCIUM 9.2 02/14/2017 1021   PROT 7.0 02/07/2017 1108   PROT 7.2 09/23/2015 0959   ALBUMIN 3.9 02/07/2017 1108   ALBUMIN 4.1 09/23/2015 0959   AST 18 02/07/2017 1108   ALT 11 02/07/2017 1108   ALKPHOS 25 (L) 02/07/2017 1108   BILITOT 0.5 02/07/2017 1108   BILITOT 0.3 09/23/2015 0959   GFRNONAA 61 09/23/2015 0959   GFRAA 70 09/23/2015 0959   Lab Results  Component Value Date   CHOL 163 02/07/2017   HDL 54.70 02/07/2017   LDLCALC 82 02/07/2017   LDLDIRECT 115.0 08/06/2016   TRIG 130.0 02/07/2017   CHOLHDL 3 02/07/2017   Lab Results  Component Value Date   HGBA1C 6.5 08/06/2016   Lab Results  Component Value Date   VITAMINB12 383 09/23/2015   Lab Results  Component Value Date   TSH 1.55 02/07/2017      ASSESSMENT AND PLAN  83 y.o. year old female  has a past medical history of  Memory loss; Peripheral neuropathy ([place]); here to follow-up for memory loss   PLAN: Continue  Namenda10 mg twice a day will refill  Encouraged moderate exercise Strategy exercises such as cross words, strategy games solitaire, word search etc. Patient is not  interested in research study for memory loss Cautioned daughter to be careful with patient's driving, good idea to periodically ride with her and monitor for safety Return to clinic in 6 months I spent 25 min  in total face to face time with the patient and son more than 50% of which was spent counseling and coordination of care, reviewing test results reviewing medications and discussing and reviewing the diagnosis of memory loss and further treatment options.. Please continue to monitor for any safety issues in the home , Dennie Bible, [facility], [facility], APRN  [facility] [address], [place], [place] [address] [phone]

## 2017-04-11 ENCOUNTER — Ambulatory Visit: Payer: Medicare Other | Admitting: Nurse Practitioner

## 2017-04-11 ENCOUNTER — Encounter: Payer: Self-pay | Admitting: Nurse Practitioner

## 2017-04-11 VITALS — BP 122/55 | HR 60 | Ht 62.0 in | Wt 168.8 lb

## 2017-04-11 DIAGNOSIS — G3184 Mild cognitive impairment, so stated: Secondary | ICD-10-CM

## 2017-04-11 DIAGNOSIS — R269 Unspecified abnormalities of gait and mobility: Secondary | ICD-10-CM

## 2017-04-11 MED ORDER — MEMANTINE HCL 10 MG PO TABS: ORAL_TABLET | ORAL | 3 refills | Status: DC

## 2017-04-11 NOTE — Progress Notes (Signed)
I have reviewed and agreed above plan. 

## 2017-04-11 NOTE — Patient Instructions (Signed)
Continue  Namenda10 mg twice a day will refill  Encouraged moderate exercise Strategy exercises such as cross words, strategy games solitaire, word search etc. Return to clinic in 6 months

## 2017-05-07 ENCOUNTER — Other Ambulatory Visit: Payer: Self-pay | Admitting: Family Medicine

## 2017-05-13 ENCOUNTER — Encounter: Payer: Self-pay | Admitting: Family Medicine

## 2017-05-13 ENCOUNTER — Other Ambulatory Visit: Payer: Self-pay

## 2017-05-13 ENCOUNTER — Ambulatory Visit: Payer: Medicare Other | Admitting: Family Medicine

## 2017-05-13 VITALS — BP 122/76 | HR 72 | Temp 99.4°F | Resp 17 | Ht 62.0 in | Wt 166.5 lb

## 2017-05-13 DIAGNOSIS — R05 Cough: Secondary | ICD-10-CM | POA: Diagnosis not present

## 2017-05-13 DIAGNOSIS — R059 Cough, unspecified: Secondary | ICD-10-CM

## 2017-05-13 MED ORDER — FLUTICASONE PROPIONATE 50 MCG/ACT NA SUSP: 2.0000 | Freq: Every day | NASAL | 6 refills | Status: DC

## 2017-05-13 NOTE — Patient Instructions (Signed)
Follow up as needed or as scheduled Your cough appears to be allergy related (post-nasal drip) Drink plenty of fluids START the nasal spray- 2 sprays each nostril daily Change your clothes and bathe after working in the yard to limit pollen exposure Call with any questions or concerns Happy Early Mother's Day!!!

## 2017-05-13 NOTE — Progress Notes (Signed)
   Subjective:    Patient ID: Kristina [name], female    DOB: 1931/02/15, 84 y.o.   MRN: [id number]  HPI Cough- 'a cough that I can't get rid of'.  sxs started 'several days ago.  A week or so'.  Does a lot of yard work.  No fevers.  Cough is rarely productive- mostly dry.  No known sick contacts.  Denies SOB.  Denies HAs, sinus production.  Pt feels cough is worsening.  Pt reports feeling well w/ exception of cough.   Review of Systems For ROS see HPI     Objective:   Physical Exam  Constitutional: She is oriented to person, place, and time. She appears well-developed and well-nourished. No distress.  HENT:  Head: Normocephalic and atraumatic.  Right Ear: Tympanic membrane normal.  Left Ear: Tympanic membrane normal.  Nose: Mucosal edema and rhinorrhea present. Right sinus exhibits no maxillary sinus tenderness and no frontal sinus tenderness. Left sinus exhibits no maxillary sinus tenderness and no frontal sinus tenderness.  Mouth/Throat: Mucous membranes are normal. Posterior oropharyngeal erythema (w/ PND) present.  Eyes: Pupils are equal, round, and reactive to light. Conjunctivae and EOM are normal.  Neck: Normal range of motion. Neck supple.  Cardiovascular: Normal rate, regular rhythm and normal heart sounds.  Pulmonary/Chest: Effort normal and breath sounds normal. No respiratory distress. She has no wheezes. She has no rales.  Lymphadenopathy:    She has no cervical adenopathy.  Neurological: She is alert and oriented to person, place, and time.  Skin: Skin is warm and dry.  Vitals reviewed.         Assessment & Plan:  Cough- sxs are consistent w/ allergic rhinitis and PND.  Lungs are CTAB.  No evidence of infxn.  Reviewed dx and supportive care.  Start Flonase.  Pt expressed understanding and is in agreement w/ plan.

## 2017-05-23 ENCOUNTER — Ambulatory Visit
Admission: RE | Admit: 2017-05-23 | Discharge: 2017-05-23 | Disposition: A | Discharge status: Home / Self Care | Payer: Medicare Other | Source: Ambulatory Visit | Attending: Family Medicine | Admitting: Family Medicine

## 2017-05-23 DIAGNOSIS — R921 Mammographic calcification found on diagnostic imaging of breast: Secondary | ICD-10-CM

## 2017-06-02 ENCOUNTER — Other Ambulatory Visit: Payer: Self-pay | Admitting: Family Medicine

## 2017-06-02 DIAGNOSIS — E781 Pure hyperglyceridemia: Secondary | ICD-10-CM

## 2017-06-18 ENCOUNTER — Other Ambulatory Visit: Payer: Self-pay | Admitting: Family Medicine

## 2017-06-18 DIAGNOSIS — E559 Vitamin D deficiency, unspecified: Secondary | ICD-10-CM

## 2017-07-26 ENCOUNTER — Other Ambulatory Visit: Payer: Self-pay | Admitting: Nurse Practitioner

## 2017-07-28 ENCOUNTER — Encounter: Payer: Medicare Other | Admitting: Family Medicine

## 2017-07-28 ENCOUNTER — Ambulatory Visit: Payer: Medicare Other

## 2017-08-03 ENCOUNTER — Other Ambulatory Visit: Payer: Self-pay | Admitting: Family Medicine

## 2017-08-16 ENCOUNTER — Other Ambulatory Visit: Payer: Self-pay | Admitting: Family Medicine

## 2017-09-07 ENCOUNTER — Telehealth: Payer: Self-pay | Admitting: Family Medicine

## 2017-09-07 MED ORDER — EPINEPHRINE 0.3 MG/0.3ML IJ SOAJ: 0.3000 mg | Freq: Once | INTRAMUSCULAR | 1 refills | Status: DC | PRN

## 2017-09-07 NOTE — Telephone Encounter (Signed)
Medication filled to pharmacy as requested.   

## 2017-09-07 NOTE — Telephone Encounter (Signed)
Copied from [place] [phone]. Topic: Quick Communication - See Telephone Encounter >> Sep 07, 2017 11:46 AM Mylinda Latina, NT wrote: CRM for notification. See Telephone encounter for: 09/07/17. Patient daughter called and she needs a refill of her EPINEPHrine 0.3 mg/0.3 mL IJ SOAJ injection. Patient daughter has questions regarding this medication   [id number] - [place], [place] - [address] [phone] (Phone) [phone] (Fax)

## 2017-09-07 NOTE — Telephone Encounter (Signed)
Epinephine 0.3 mg/0.3 ml refill Last Refill:06/14/16 1 device 1 RF  Last OV: Last ordered by ED doctor  PCP: Dr Birdie Riddle [facility] [address]

## 2017-09-15 NOTE — Progress Notes (Addendum)
Subjective:   Kristina [name] is a 84 y.o. female who presents for Medicare Annual (Subsequent) preventive examination.  Review of Systems:  No ROS.  Medicare Wellness Visit. Additional risk factors are reflected in the social history.  Cardiac Risk Factors include: advanced age (>41men, >81 women);hypertension;obesity (BMI >30kg/m2);sedentary lifestyle;family history of premature cardiovascular disease   Sleep patterns: Sleeps 8 hours. Up to void x 1.  Home Safety/Smoke Alarms: Feels safe in home. Smoke alarms in place.  Living environment; residence and Firearm Safety: Daughter and son-in-law live with pt in 1 story home.  Seat Belt Safety/Bike Helmet: Wears seat belt.   Female:   Pap-N/A     Mammo-05/23/2017, BI-RADS CATEGORY  3: Probably benign.        Dexa scan-Declines testing.        CCS-Colonoscopy 09/14/2013, polyps.     Objective:     Vitals: BP 122/60 (BP Location: Left Arm, Patient Position: Sitting, Cuff Size: Normal)   Pulse (!) 56   Temp 97.9 F (36.6 C) (Temporal)   Resp 16   Ht 5\' 2"  (1.575 m)   Wt 160 lb 6 oz (72.7 kg)   SpO2 98%   BMI 29.33 kg/m   Body mass index is 29.33 kg/m.  Advanced Directives 09/16/2017 08/05/2016 06/14/2016 09/03/2013  Does Patient Have a Medical Advance Directive? Yes No No No  Type of Paramedic of [place];Living will - - -  Copy of [place] in Chart? No - copy requested - - -  Would patient like information on creating a medical advance directive? - Yes (MAU/Ambulatory/Procedural Areas - Information given) No - Patient declined -    Tobacco Social History   Tobacco Use  Smoking Status Never Smoker  Smokeless Tobacco Never Used     Counseling given: Not Answered    Past Medical History:  Diagnosis Date  . Benign paroxysmal positional vertigo   . Diverticulosis   . Duodenal ulcer   . Endometrial cancer [facility])    age 47  . GERD (gastroesophageal reflux disease)   .  Hypertension   . Hypothyroidism   . Memory loss   . Peripheral neuropathy   . 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 CATARACT EXTRACTION    . CHOLECYSTECTOMY    . FOOT SURGERY     Right  . KNEE SURGERY     Right  . VAGINAL HYSTERECTOMY     Family History  Problem Relation Age of Onset  . Dementia Mother   . Heart attack Father   . Heart disease Sister   . Breast cancer Sister 25  . Cancer Brother   . Colon cancer Neg Hx    Social History   Socioeconomic History  . Marital status: Married    Spouse name: Not on file  . Number of children: 3  . Years of education: 37  . Highest education level: Not on file  Occupational History  . Occupation: Retired  Scientific laboratory technician  . Financial resource strain: Not on file  . Food insecurity:    Worry: Not on file    Inability: Not on file  . Transportation needs:    Medical: Not on file    Non-medical: Not on file  Tobacco Use  . Smoking status: Never Smoker  . Smokeless tobacco: Never Used  Substance and Sexual Activity  . Alcohol use: No    Alcohol/week: 0.0 standard drinks  .  Drug use: No  . Sexual activity: Not on file  Lifestyle  . Physical activity:    Days per week: Not on file    Minutes per session: Not on file  . Stress: Not on file  Relationships  . Social connections:    Talks on phone: Not on file    Gets together: Not on file    Attends religious service: Not on file    Active member of club or organization: Not on file    Attends meetings of clubs or organizations: Not on file    Relationship status: Not on file  Other Topics Concern  . Not on file  Social History Narrative   Lives with her daughter, Kristina [name].   Widowed.   Retired.   Right hand.   Three children.   High school education.   2 cups coffee daily.    Outpatient Encounter Medications as of 09/16/2017  Medication Sig  . atenolol (TENORMIN) 25 MG tablet TAKE 1 TABLET BY MOUTH  EVERY DAY  . Cholecalciferol (VITAMIN D3) 2000 units capsule TAKE 1 CAPSULE BY MOUTH EVERY DAY  . diphenhydrAMINE (BENADRYL) 25 MG tablet Take 2 tablets (50 mg total) by mouth every 6 (six) hours as needed for itching (or hives).  . fenofibrate 160 MG tablet TAKE 1 TABLET BY MOUTH EVERY DAY  . fluticasone (FLONASE) 50 MCG/ACT nasal spray Place 2 sprays into both nostrils daily.  Marland Kitchen levothyroxine (SYNTHROID, LEVOTHROID) 75 MCG tablet TAKE 1 TABLET BY MOUTH EVERY DAY  . EPINEPHrine 0.3 mg/0.3 mL IJ SOAJ injection Inject 0.3 mLs (0.3 mg total) into the muscle once as needed for up to 1 dose (severe allergic reaction). (Patient not taking: Reported on 09/16/2017)  . loperamide (IMODIUM) 2 MG capsule Take 2 mg by mouth at bedtime.   . memantine (NAMENDA) 10 MG tablet TAKE 1 TABLET BY MOUTH TWICE A DAY. (Patient not taking: Reported on 09/16/2017)  . naproxen sodium (ANAPROX) 220 MG tablet Take 220 mg by mouth at bedtime.   . triamcinolone ointment (KENALOG) 0.1 % APPLY TO AFFECTED AREA TWICE DAILY (Patient not taking: Reported on 09/16/2017)   No facility-administered encounter medications on file as of 09/16/2017.     Activities of Daily Living In your present state of health, do you have any difficulty performing the following activities: 09/16/2017 02/07/2017  Hearing? N N  Vision? N N  Difficulty concentrating or making decisions? N N  Walking or climbing stairs? N N  Dressing or bathing? N N  Doing errands, shopping? N N  Preparing Food and eating ? N -  Using the Toilet? N -  In the past six months, have you accidently leaked urine? N -  Do you have problems with loss of bowel control? N -  Managing your Medications? N -  Managing your Finances? N -  Housekeeping or managing your Housekeeping? N -  Some recent data might be hidden    Patient Care Team: Midge Minium, MD as PCP - General (Family Medicine) Marcial Pacas, MD as Consulting Physician (Neurology) Ladene Artist, MD as  Consulting Physician (Gastroenterology)    Assessment:   This is a routine wellness examination for [name].  Exercise Activities and Dietary recommendations Current Exercise Habits: The patient does not participate in regular exercise at present([place]; walks around yard), Exercise limited by: None identified   Diet (meal preparation, eat out, water intake, caffeinated beverages, dairy products, fruits and vegetables): Drinks water, coffee (2 cups) and sweet tea. Avoids  soda and juices.   Breakfast: yogurt/cookie Lunch: left overs; tuna; salad Dinner: ice cream   Goals    . Increase physical activity     Increasing walking around yard.     . Patient Stated     Maintain current health by staying active.        Fall Risk Fall Risk  09/16/2017 02/07/2017 08/05/2016 04/23/2016 02/05/2016  Falls in the past year? No No Yes No No  Comment - - tripped outside - -  Number falls in past yr: - - 1 - -  Injury with Fall? - - No - -  Risk for fall due to : - - Impaired mobility - -  Follow up - - Falls prevention discussed - -     Depression Screen PHQ 2/9 Scores 09/16/2017 02/07/2017 08/05/2016 02/05/2016  PHQ - 2 Score 0 0 0 0  PHQ- 9 Score - 0 - 0     Cognitive Function MMSE - Mini Mental State Exam 09/16/2017 04/11/2017 08/05/2016 04/23/2016 09/23/2015  Orientation to time 4 5 5 3 5   Orientation to Place 5 4 5 5 5   Registration 3 3 3 3 3   Attention/ Calculation 5 5 5 5 5   Recall 0 1 1 1  0  Language- name 2 objects 2 2 2 2 2   Language- repeat 1 1 1  0 1  Language- follow 3 step command 3 2 3 3 3   Language- read & follow direction 1 1 1 1 1   Write a sentence 1 1 1 1 1   Copy design 1 1 1 1 1   Total score 26 26 28 25 27         Immunization History  Administered Date(s) Administered  . Influenza, High Dose Seasonal PF 10/22/2015, 10/14/2016, 09/16/2017  . Influenza,inj,Quad PF,6+ Mos 10/30/2014  . Pneumococcal Conjugate-13 11/05/2015  . Pneumococcal Polysaccharide-23 01/06/2001,  10/22/2015, 08/05/2016  . Td 03/12/2003  . Zoster Recombinat (Shingrix) 08/06/2016    Screening Tests Health Maintenance  Topic Date Due  . INFLUENZA VACCINE  08/11/2017  . TETANUS/TDAP  02/07/2018 (Originally 03/11/2013)  . COLONOSCOPY  05/14/2018 (Originally 09/14/2016)  . MAMMOGRAM  05/24/2018  . DEXA SCAN  Completed  . PNA vac Low Risk Adult  Completed        Plan:      Bring a copy of your living will and/or healthcare power of attorney to your next office visit.  Continue doing brain stimulating activities (puzzles, reading, adult coloring books, staying active) to keep memory sharp.   I have personally reviewed and noted the following in the patient's chart:   . Medical and social history . Use of alcohol, tobacco or illicit drugs  . Current medications and supplements . Functional ability and status . Nutritional status . Physical activity . Advanced directives . List of other physicians . Hospitalizations, surgeries, and ER visits in previous 12 months . Vitals . Screenings to include cognitive, depression, and falls . Referrals and appointments  In addition, I have reviewed and discussed with patient certain preventive protocols, quality metrics, and best practice recommendations. A written personalized care plan for preventive services as well as general preventive health recommendations were provided to patient.     Gerilyn Nestle, RN  09/16/2017   Reviewed documentation provided by RN and agree w/ above.  Annye Asa, MD

## 2017-09-16 ENCOUNTER — Other Ambulatory Visit: Payer: Self-pay

## 2017-09-16 ENCOUNTER — Ambulatory Visit (INDEPENDENT_AMBULATORY_CARE_PROVIDER_SITE_OTHER): Payer: Medicare Other | Admitting: Family Medicine

## 2017-09-16 ENCOUNTER — Telehealth: Payer: Self-pay | Admitting: Emergency Medicine

## 2017-09-16 ENCOUNTER — Ambulatory Visit (INDEPENDENT_AMBULATORY_CARE_PROVIDER_SITE_OTHER): Payer: Medicare Other

## 2017-09-16 ENCOUNTER — Encounter: Payer: Self-pay | Admitting: Family Medicine

## 2017-09-16 VITALS — BP 122/60 | HR 56 | Temp 97.9°F | Resp 16 | Ht 62.0 in | Wt 160.4 lb

## 2017-09-16 VITALS — BP 122/60 | HR 56 | Temp 97.9°F | Resp 16 | Ht 62.0 in | Wt 160.6 lb

## 2017-09-16 DIAGNOSIS — E559 Vitamin D deficiency, unspecified: Secondary | ICD-10-CM

## 2017-09-16 DIAGNOSIS — Z0000 Encounter for general adult medical examination without abnormal findings: Secondary | ICD-10-CM | POA: Diagnosis not present

## 2017-09-16 DIAGNOSIS — I10 Essential (primary) hypertension: Secondary | ICD-10-CM | POA: Diagnosis not present

## 2017-09-16 DIAGNOSIS — Z23 Encounter for immunization: Secondary | ICD-10-CM

## 2017-09-16 DIAGNOSIS — E669 Obesity, unspecified: Secondary | ICD-10-CM | POA: Diagnosis not present

## 2017-09-16 LAB — HEPATIC FUNCTION PANEL
ALBUMIN: 3.8 g/dL (ref 3.5–5.2)
ALT: 11 U/L (ref 0–35)
AST: 20 U/L (ref 0–37)
Alkaline Phosphatase: 24 U/L — ABNORMAL LOW (ref 39–117)
BILIRUBIN TOTAL: 0.4 mg/dL (ref 0.2–1.2)
Bilirubin, Direct: 0.1 mg/dL (ref 0.0–0.3)
Total Protein: 6.7 g/dL (ref 6.0–8.3)

## 2017-09-16 LAB — CBC WITH DIFFERENTIAL/PLATELET
BASOS PCT: 0.7 % (ref 0.0–3.0)
Basophils Absolute: 0.1 10*3/uL (ref 0.0–0.1)
EOS PCT: 1.1 % (ref 0.0–5.0)
Eosinophils Absolute: 0.1 10*3/uL (ref 0.0–0.7)
HCT: 37.9 % (ref 36.0–46.0)
HEMOGLOBIN: 12.6 g/dL (ref 12.0–15.0)
Lymphocytes Relative: 30.9 % (ref 12.0–46.0)
Lymphs Abs: 3.2 10*3/uL (ref 0.7–4.0)
MCHC: 33.2 g/dL (ref 30.0–36.0)
MCV: 95.1 fl (ref 78.0–100.0)
MONO ABS: 0.6 10*3/uL (ref 0.1–1.0)
MONOS PCT: 5.5 % (ref 3.0–12.0)
Neutro Abs: 6.4 10*3/uL (ref 1.4–7.7)
Neutrophils Relative %: 61.8 % (ref 43.0–77.0)
Platelets: 360 10*3/uL (ref 150.0–400.0)
RBC: 3.99 Mil/uL (ref 3.87–5.11)
RDW: 13.8 % (ref 11.5–15.5)
WBC: 10.4 10*3/uL (ref 4.0–10.5)

## 2017-09-16 LAB — BASIC METABOLIC PANEL
BUN: 26 mg/dL — AB (ref 6–23)
CALCIUM: 9.4 mg/dL (ref 8.4–10.5)
CO2: 31 mEq/L (ref 19–32)
CREATININE: 1.2 mg/dL (ref 0.40–1.20)
Chloride: 102 mEq/L (ref 96–112)
GFR: 45.51 mL/min — ABNORMAL LOW (ref >60.00)
GLUCOSE: 96 mg/dL (ref 70–99)
Potassium: 4.8 mEq/L (ref 3.5–5.1)
Sodium: 141 mEq/L (ref 135–145)

## 2017-09-16 LAB — LIPID PANEL
Cholesterol: 142 mg/dL (ref 0–200)
HDL: 54.7 mg/dL (ref >39.00)
LDL Cholesterol: 61 mg/dL (ref 0–99)
NONHDL: 87.58
Total CHOL/HDL Ratio: 3
Triglycerides: 134 mg/dL (ref 0.0–149.0)
VLDL: 26.8 mg/dL (ref 0.0–40.0)

## 2017-09-16 LAB — TSH: TSH: 1.29 u[IU]/mL (ref 0.35–4.50)

## 2017-09-16 LAB — VITAMIN D 25 HYDROXY (VIT D DEFICIENCY, FRACTURES): VITD: 61.51 ng/mL (ref 30.00–100.00)

## 2017-09-16 NOTE — Telephone Encounter (Signed)
Copied from [place] [id number]. Topic: General - Other >> Sep 16, 2017  1:23 PM Yvette Rack wrote: Reason for CRM: pt daughter Garret Reddish [phone] calling to see how her Mothers appt went today

## 2017-09-16 NOTE — Telephone Encounter (Signed)
Please advise 

## 2017-09-16 NOTE — Patient Instructions (Addendum)

## 2017-09-16 NOTE — Telephone Encounter (Signed)
Called patient and LMOVM to return call  [place] for [facility] to Discuss results / PCP / recommendations / Schedule patient : Appt went well. Waiting on labs. No concerns at this time. Encourage her to come to next appt if available.  CRM Created.

## 2017-09-16 NOTE — Patient Instructions (Signed)
Follow up in 6 months to recheck BP, thyroid, and cholesterol We'll notify you of your lab results and make any changes if needed Keep up the good work on healthy diet and regular exercise- you look great! Call with any questions or concerns Happy Fall!!!

## 2017-09-16 NOTE — Progress Notes (Signed)
   Subjective:    Patient ID: Kristina [name], female    DOB: [birth year], 84 y.o.   MRN: [id number]  HPI CPE- pt is UTD on mammo, colonoscopy.  Got flu shot today.   Review of Systems Patient reports no vision/ hearing changes, adenopathy,fever, weight change,  persistant/recurrent hoarseness , swallowing issues, chest pain, palpitations, edema, persistant/recurrent cough, hemoptysis, dyspnea (rest/exertional/paroxysmal nocturnal), gastrointestinal bleeding (melena, rectal bleeding), abdominal pain, significant heartburn, bowel changes, GU symptoms (dysuria, hematuria, incontinence), Gyn symptoms (abnormal  bleeding, pain),  syncope, focal weakness, memory loss, numbness & tingling, skin/hair/nail changes, abnormal bruising or bleeding, anxiety, or depression.     Objective:   Physical Exam General Appearance:    Alert, cooperative, no distress, appears stated age  Head:    Normocephalic, without obvious abnormality, atraumatic  Eyes:    PERRL, conjunctiva/corneas clear, EOM's intact, fundi    benign, both eyes  Ears:    Normal TM's and external ear canals, both ears  Nose:   Nares normal, septum midline, mucosa normal, no drainage    or sinus tenderness  Throat:   Lips, mucosa, and tongue normal; teeth and gums normal  Neck:   Supple, symmetrical, trachea midline, no adenopathy;    Thyroid: no enlargement/tenderness/nodules  Back:     Symmetric, no curvature, ROM normal, no CVA tenderness  Lungs:     Clear to auscultation bilaterally, respirations unlabored  Chest Wall:    No tenderness or deformity   Heart:    Regular rate and rhythm, S1 and S2 normal, no murmur, rub   or gallop  Breast Exam:    Deferred to mammo  Abdomen:     Soft, non-tender, bowel sounds active all four quadrants,    no masses, no organomegaly  Genitalia:    Deferred  Rectal:    Extremities:   Extremities normal, atraumatic, no cyanosis or edema  Pulses:   2+ and symmetric all extremities  Skin:   Skin  color, texture, turgor normal, no rashes or lesions  Lymph nodes:   Cervical, supraclavicular, and axillary nodes normal  Neurologic:   CNII-XII intact, normal strength, sensation and reflexes    throughout          Assessment & Plan:

## 2017-09-16 NOTE — Assessment & Plan Note (Signed)
Chronic problem.  Well controlled.  Asymptomatic.  Check labs.  No anticipated med changes.  Will follow. 

## 2017-09-16 NOTE — Telephone Encounter (Signed)
Appt went well.  Waiting on labs.  No concerns at this time.  Encourage her to come to next appt if available

## 2017-09-16 NOTE — Assessment & Plan Note (Signed)
Pt's PE WNL.  UTD on mammo, colonoscopy, and immunizations.  Check labs.  Anticipatory guidance provided.  

## 2017-09-16 NOTE — Assessment & Plan Note (Signed)
Pt has hx of this.  Check labs and replete prn. 

## 2017-09-19 NOTE — Telephone Encounter (Signed)
Called daughter Garret Reddish and gave lab results and daughter also let me know she received this message and that her sister or herself will be with Ms. Harms on her next visit.

## 2017-09-21 ENCOUNTER — Other Ambulatory Visit: Payer: Self-pay | Admitting: Family Medicine

## 2017-09-30 ENCOUNTER — Other Ambulatory Visit: Payer: Self-pay | Admitting: Family Medicine

## 2017-09-30 DIAGNOSIS — E559 Vitamin D deficiency, unspecified: Secondary | ICD-10-CM

## 2017-10-11 NOTE — Progress Notes (Signed)
[facility]  PATIENT: Kristina [name] DOB: Feb 18, 1931   REASON FOR VISIT: Follow-up for memory loss HISTORY FROM: Patient and daughter    HISTORY OF PRESENT ILLNESS: HISTORY Kristina [name] is 84 years old right-handed female, accompanied by her daughter Kristina [name],  seen in refer byher primary care physicians Dr. Tollie Pizza for evaluation of memory loss on September 23 2015   I reviewed and summarized the referring note, she had a history of hypertension, impaired fasting glucose, A1c was 6.5, hypothyroidism, She also had a history of endometrial cancer in 1998/04/21, was treated with hysterectomy, but did not require chemotherapy or radiation therapy, also reported a history of duodenal ulcer many years ago, peripheral neuropathy, numbness in her toes for many years, I evaluate her in January 2016, EMG nerve conduction study then showed no large fiber peripheral neuropathy or lumbar sacral radiculopathy.   I have personally reviewed MRI cervical spine in February 2016, which showed noticeable cervical spondylosis at C5-6, with mild to moderate canal stenosis, but no cord deformity or signal change.   She has hyperreflexia on examination, she denies significant gait abnormality, no bowel and bladder incontinence, she does has occasionally diarrhea, urgency, she denies significant low back pain.     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  Her husband passed away in 04-21-2010.   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  She has bilateral knee pain, take aleve every night, sleeps well, she was noted to have less appetite, lost some weight,  She still prepare meal every Sunday, cooking for 6  people.  She does realized that she forget things, names, misplace things, she tends to repeat questions,  She forgot what disease her husband died from. She still drives, but got lost sometimes.  Her mother does become forgetful in her 25s.    She did have lab evaluation 6 months ago. There was no significant abnormality found  UPDATE Oct 23 2015:YY We have personally reviewed MRI of brain without contrast in October 2017, generalized atrophy, especially at the perisyalvian fissure region, mild small vessel disease, chronic left maxillary sphenoid sinusitis She likes to read,but sedentary lifestyle Laboratory evaluation showed normal TSH, B12, CMP,mild elevated C reactive protein, WBC 11 point 7, UPDATE 04/13/2018[name], 84 year old female returns for follow-up. She has a history of memory loss was placed on Namenda at her last visit. She denies any side effects to the medication. She thinks her memory is stable. She continues to be independent in activities of daily living except that her daughter does her finances. She continues to drive without getting lost. She does not exercise. She denies any falls or balance issues . No safety issues identified. She returns for reevaluation UPDATE 4/1/2019[name], 84 year old female returns for follow-up with her daughter.  She has a history of memory loss and is currently on Namenda 10 mg twice daily.  Her memory score is stable.  She denies side effects to the medication.  She remains active and mows her grass, does errands in the car locally.  Does not drive on the highway.  She continues to Psychologist, occupational at [employer].  Appetite is good and she is sleeping  well.  She remains independent in activities of daily living except that the daughter does her finances.  No falls or balance issues.  No safety issues identified.  She returns for reevaluation UPDATE 10/2/2019[name], 84 year old female returns for follow-up with history of  memory loss mild cognitive impairment.  She is currently on Namenda 10 mg twice daily without side effects.  Her memory score has remained stable she continues to remain active.  She continues to drive locally to the grocery store to get her hair done etc. she has not had any accidents she has not gotten lost.  Appetite is good and she is sleeping well.  No wandering behavior at night no hallucinations.  She denies any falls.  No safety  issues identified.  Daughter prepares her medications and does her finances otherwise patient is independent.  She returns for reevaluation REVIEW OF SYSTEMS: Full 14 system review of systems performed and notable only for those listed, all others are neg:  Constitutional: neg  Cardiovascular: neg Ear/Nose/Throat: neg  Skin: neg Eyes: neg Respiratory: neg Gastroitestinal: neg  Hematology/Lymphatic: neg  Endocrine: neg Musculoskeletal: Joint pain Allergy/Immunology: neg Neurological: Memory loss Psychiatric: neg Sleep : neg   ALLERGIES: Allergies  Allergen Reactions  . Bee Venom Shortness Of Breath, Itching and Rash    Almost passed out also  . Penicillins Rash    Has patient had a PCN reaction causing immediate rash, facial/tongue/throat swelling, SOB or lightheadedness with hypotension: Yes Has patient had a PCN reaction causing severe rash involving mucus membranes or skin necrosis: No Has patient had a PCN reaction that required hospitalization: Unknown Has patient had a PCN reaction occurring within the last 10 years: No If all of the above answers are "NO", then may proceed with Cephalosporin use.     HOME MEDICATIONS: Outpatient Medications Prior to Visit  Medication Sig Dispense Refill  . atenolol (TENORMIN) 25 MG tablet TAKE 1 TABLET BY MOUTH EVERY DAY 90 tablet 1  . [facility] D3 2000 units CAPS TAKE 1 CAPSULE BY MOUTH EVERY DAY 100 capsule 0  . diphenhydrAMINE (BENADRYL) 25 MG tablet Take 2 tablets (50 mg total) by mouth every 6 (six) hours as  needed for itching (or hives). 60 tablet 0  . EPINEPHrine 0.3 mg/0.3 mL IJ SOAJ injection Inject 0.3 mLs (0.3 mg total) into the muscle once as needed for up to 1 dose (severe allergic reaction). 1 Device 1  . fenofibrate 160 MG tablet TAKE 1 TABLET BY MOUTH EVERY DAY 30 tablet 6  . fluticasone (FLONASE) 50 MCG/ACT nasal spray Place 2 sprays into both nostrils daily. 16 g 6  . levothyroxine (SYNTHROID, LEVOTHROID) 75 MCG tablet TAKE 1 TABLET BY MOUTH EVERY DAY 90 tablet 1  . loperamide (IMODIUM) 2 MG capsule Take 2 mg by mouth at bedtime.     . memantine (NAMENDA) 10 MG tablet TAKE 1 TABLET BY MOUTH TWICE A DAY. 180 tablet 3  . naproxen sodium (ANAPROX) 220 MG tablet Take 220 mg by mouth at bedtime.     . triamcinolone ointment (KENALOG) 0.1 % APPLY TO AFFECTED AREA TWICE DAILY 80 g 1   No facility-administered medications prior to visit.     PAST MEDICAL HISTORY: Past Medical History:  Diagnosis Date  . Benign paroxysmal positional vertigo   . Diverticulosis   . Duodenal ulcer   . Endometrial cancer [facility])    age 9  . GERD (gastroesophageal reflux disease)   . Hypertension   . Hypothyroidism   .  Memory loss   . Peripheral neuropathy   . Tubulovillous adenoma polyp of colon 04/1998    PAST SURGICAL HISTORY: Past Surgical History:  Procedure Laterality Date  . CATARACT EXTRACTION    . CHOLECYSTECTOMY    . FOOT SURGERY     Right  . KNEE SURGERY     Right  . VAGINAL HYSTERECTOMY      FAMILY HISTORY: Family History  Problem Relation Age of Onset  . Dementia Mother   . Heart attack Father   . Heart disease Sister   . Breast cancer Sister 50  . Cancer Brother   . Colon cancer Neg Hx     SOCIAL HISTORY: Social History   Socioeconomic History  . Marital status: Married    Spouse name: Not on file  . Number of children: 3  . Years of education: 52  . Highest education level: Not on file  Occupational History  . Occupation: Retired  Scientific laboratory technician  . Financial  resource strain: Not on file  . Food insecurity:    Worry: Not on file    Inability: Not on file  . Transportation needs:    Medical: Not on file    Non-medical: Not on file  Tobacco Use  . Smoking status: Never Smoker  . Smokeless tobacco: Never Used  Substance and Sexual Activity  . Alcohol use: No    Alcohol/week: 0.0 standard drinks  . Drug use: No  . Sexual activity: Not on file  Lifestyle  . Physical activity:    Days per week: Not on file    Minutes per session: Not on file  . Stress: Not on file  Relationships  . Social connections:    Talks on phone: Not on file    Gets together: Not on file    Attends religious service: Not on file    Active member of club or organization: Not on file    Attends meetings of clubs or organizations: Not on file    Relationship status: Not on file  . Intimate partner violence:    Fear of current or ex partner: Not on file    Emotionally abused: Not on file    Physically abused: Not on file    Forced sexual activity: Not on file  Other Topics Concern  . Not on file  Social History Narrative   Lives with her daughter, [name].   Widowed.   Retired.   Right hand.   Three children.   High school education.   2 cups coffee daily.     PHYSICAL EXAM  Vitals:   10/12/17 0815  BP: 126/69  Pulse: (!) 54  Weight: 160 lb 3.2 oz (72.7 kg)  Height: 5\' 2"  (1.575 m)   Body mass index is 29.3 kg/m.  Generalized: Well developed, obese female in no acute distress , well-groomed Head: normocephalic and atraumatic,. Oropharynx benign  Neck: Supple,  Musculoskeletal: No deformity   Neurological examination   Mentation: Alert AFT 5. Clock drawing 3/4 MMSE - Mini Mental State Exam 10/12/2017 09/16/2017 04/11/2017  Orientation to time 4 4 5   Orientation to Place 5 5 4   Registration 3 3 3   Attention/ Calculation 5 5 5   Recall 2 0 1  Language- name 2 objects 2 2 2   Language- repeat 1 1 1   Language- follow 3 step command 3 3 2    Language- read & follow direction 1 1 1   Write a sentence 1 1 1   Copy design 1 1 1  Total score 28 26 26   Follows all commands speech and language fluent.    Cranial nerve II-XII: Pupils were equal round reactive to light extraocular movements were full, visual field were full on confrontational test. Facial sensation and strength were normal. hearing was intact to finger rubbing bilaterally. Uvula tongue midline. head turning and shoulder shrug were normal and symmetric.Tongue protrusion into cheek strength was normal. Motor: normal bulk and tone, full strength in the BUE, BLE,  Sensory: Length dependent decreased light touch pinprick and vibratory sensation at toes  Coordination: finger-nose-finger, heel-to-shin bilaterally, no dysmetria Reflexes: Symmetric upper and lower, plantar responses were flexor bilaterally. Gait and Station: Rising up from seated position with push off cautious gait no assistive device. Can heel and toe walk Tandem gait is unsteady  DIAGNOSTIC DATA (LABS, IMAGING, TESTING) - I reviewed patient records, labs, notes, testing and imaging myself where available.  Lab Results  Component Value Date   WBC 10.4 09/16/2017   HGB 12.6 09/16/2017   HCT 37.9 09/16/2017   MCV 95.1 09/16/2017   PLT 360.0 09/16/2017      Component Value Date/Time   NA 141 09/16/2017 1126   NA 141 09/23/2015 0959   K 4.8 09/16/2017 1126   CL 102 09/16/2017 1126   CO2 31 09/16/2017 1126   GLUCOSE 96 09/16/2017 1126   BUN 26 (H) 09/16/2017 1126   BUN 11 09/23/2015 0959   CREATININE 1.20 09/16/2017 1126   CALCIUM 9.4 09/16/2017 1126   PROT 6.7 09/16/2017 1126   PROT 7.2 09/23/2015 0959   ALBUMIN 3.8 09/16/2017 1126   ALBUMIN 4.1 09/23/2015 0959   AST 20 09/16/2017 1126   ALT 11 09/16/2017 1126   ALKPHOS 24 (L) 09/16/2017 1126   BILITOT 0.4 09/16/2017 1126   BILITOT 0.3 09/23/2015 0959   GFRNONAA 61 09/23/2015 0959   GFRAA 70 09/23/2015 0959   Lab Results  Component Value  Date   CHOL 142 09/16/2017   HDL 54.70 09/16/2017   LDLCALC 61 09/16/2017   LDLDIRECT 115.0 08/06/2016   TRIG 134.0 09/16/2017   CHOLHDL 3 09/16/2017   Lab Results  Component Value Date   HGBA1C 6.5 08/06/2016   Lab Results  Component Value Date   VITAMINB12 383 09/23/2015   Lab Results  Component Value Date   TSH 1.29 09/16/2017      ASSESSMENT AND PLAN  84 y.o. year old female  has a past medical history of  Memory loss; Peripheral neuropathy ([place]); here to follow-up for memory loss , [facility]  PLAN: Continue  Namenda10 mg twice a day   Encouraged moderate exercise continue to be active Strategy exercises such as cross words, strategy games solitaire, word search etc. Cautioned daughter to be careful with patient's driving, good idea to periodically ride with her and monitor for safety No safety issues identified Return to clinic in 6 months I spent 25 min  in total face to face time with the patient and son more than 50% of which was spent counseling and coordination of care, reviewing test results reviewing medications and discussing and reviewing the diagnosis of memory loss and further treatment options.. Please continue to monitor for any safety issues in the home , Dennie Bible, [facility], [facility] APRN  [facility] [address], [place],  [address] [phone]

## 2017-10-12 ENCOUNTER — Encounter: Payer: Self-pay | Admitting: Nurse Practitioner

## 2017-10-12 ENCOUNTER — Ambulatory Visit: Payer: Medicare Other | Admitting: Nurse Practitioner

## 2017-10-12 VITALS — BP 126/69 | HR 54 | Ht 62.0 in | Wt 160.2 lb

## 2017-10-12 DIAGNOSIS — G3184 Mild cognitive impairment, so stated: Secondary | ICD-10-CM

## 2017-10-12 NOTE — Patient Instructions (Signed)
Continue  Namenda10 mg twice a day   Encouraged moderate exercise Strategy exercises such as cross words, strategy games solitaire, word search etc. Cautioned daughter to be careful with patient's driving, good idea to periodically ride with her and monitor for safety Return to clinic in 6 months

## 2017-10-19 NOTE — Progress Notes (Signed)
I have reviewed and agreed above plan. 

## 2017-11-15 ENCOUNTER — Encounter: Payer: Self-pay | Admitting: Family Medicine

## 2017-11-15 ENCOUNTER — Other Ambulatory Visit: Payer: Self-pay

## 2017-11-15 ENCOUNTER — Ambulatory Visit (INDEPENDENT_AMBULATORY_CARE_PROVIDER_SITE_OTHER): Payer: Medicare Other

## 2017-11-15 ENCOUNTER — Ambulatory Visit: Payer: Medicare Other | Admitting: Family Medicine

## 2017-11-15 VITALS — BP 121/68 | HR 61 | Temp 98.0°F | Resp 16 | Ht 62.0 in | Wt 159.1 lb

## 2017-11-15 DIAGNOSIS — M25571 Pain in right ankle and joints of right foot: Secondary | ICD-10-CM | POA: Diagnosis not present

## 2017-11-15 DIAGNOSIS — M19071 Primary osteoarthritis, right ankle and foot: Secondary | ICD-10-CM | POA: Diagnosis not present

## 2017-11-15 MED ORDER — PREDNISONE 10 MG PO TABS: ORAL_TABLET | ORAL | 0 refills | Status: DC

## 2017-11-15 NOTE — Progress Notes (Signed)
   Subjective:    Patient ID: Kristina [name], female    DOB: [birth year], 84 y.o.   MRN: [id number]  HPI Heel pain- R sided.  sxs started 'a couple of weeks ago'.  No known injury.  'it just started aching'.  No bruising.  No change in footwear- wears flats most of the time.  No change in activity level.  Pain is constant- even when not weight bearing.  No swelling present.  No hx of gout.  Has not taken any OTC meds.   Review of Systems For ROS see HPI     Objective:   Physical Exam  Constitutional: She is oriented to person, place, and time. She appears well-developed and well-nourished. No distress.  Cardiovascular: Intact distal pulses.  Musculoskeletal: She exhibits edema (swelling over R medial malleolus w/ TTP) and tenderness (over R medial malleolus). She exhibits no deformity.  No TTP over achilles or calcaneous No TTP over plantar fascia  Neurological: She is alert and oriented to person, place, and time. No sensory deficit. Coordination normal.  Skin: Skin is warm and dry. No rash noted. No erythema.  Vitals reviewed.         Assessment & Plan:  R ankle pain- new.  Pt complains of heel pain but it is not her heel at all.  She has point TTP over her R medial malleolus w/o known injury.  DEXA in 2014 showed normal bone density so unlikely to be a fx w/o injury but it is possible.  Pt has no hx of gout and area is not red or warm.  Start Prednisone, get xrays, and ankle brace provided.  Will determine next steps based on imaging results.  Pt expressed understanding and is in agreement w/ plan.

## 2017-11-15 NOTE — Patient Instructions (Signed)
Go to our [place] at [place] to get your xrays Wear the ankle brace to provide pain relief and support START the Prednisone as directed- take w/ food ADD Tylenol OTC for additional pain relief ELEVATE when sitting ICE for pain relief Call with any questions or concerns- particularly if not improving Hang in there!

## 2017-11-26 ENCOUNTER — Other Ambulatory Visit: Payer: Self-pay | Admitting: Family Medicine

## 2017-11-28 ENCOUNTER — Other Ambulatory Visit: Payer: Self-pay | Admitting: Family Medicine

## 2017-11-28 DIAGNOSIS — E781 Pure hyperglyceridemia: Secondary | ICD-10-CM

## 2017-11-29 ENCOUNTER — Other Ambulatory Visit: Payer: Self-pay | Admitting: Family Medicine

## 2017-12-05 ENCOUNTER — Telehealth: Payer: Self-pay | Admitting: Family Medicine

## 2017-12-05 MED ORDER — FLUTICASONE PROPIONATE 50 MCG/ACT NA SUSP: NASAL | 2 refills | Status: DC

## 2017-12-05 NOTE — Telephone Encounter (Signed)
[facility] [place]

## 2017-12-05 NOTE — Telephone Encounter (Signed)
Medication filled to pharmacy as requested.   

## 2018-01-01 ENCOUNTER — Other Ambulatory Visit: Payer: Self-pay | Admitting: Family Medicine

## 2018-01-01 DIAGNOSIS — E559 Vitamin D deficiency, unspecified: Secondary | ICD-10-CM

## 2018-01-23 ENCOUNTER — Other Ambulatory Visit: Payer: Self-pay | Admitting: Family Medicine

## 2018-03-11 ENCOUNTER — Other Ambulatory Visit: Payer: Self-pay | Admitting: Family Medicine

## 2018-03-14 ENCOUNTER — Ambulatory Visit (INDEPENDENT_AMBULATORY_CARE_PROVIDER_SITE_OTHER): Payer: Medicare Other | Admitting: Family Medicine

## 2018-03-14 ENCOUNTER — Encounter: Payer: Self-pay | Admitting: Family Medicine

## 2018-03-14 VITALS — BP 120/64 | HR 57 | Temp 97.7°F | Wt 156.8 lb

## 2018-03-14 DIAGNOSIS — E781 Pure hyperglyceridemia: Secondary | ICD-10-CM | POA: Diagnosis not present

## 2018-03-14 DIAGNOSIS — R7989 Other specified abnormal findings of blood chemistry: Secondary | ICD-10-CM | POA: Diagnosis not present

## 2018-03-14 DIAGNOSIS — E039 Hypothyroidism, unspecified: Secondary | ICD-10-CM

## 2018-03-14 DIAGNOSIS — I10 Essential (primary) hypertension: Secondary | ICD-10-CM | POA: Diagnosis not present

## 2018-03-14 NOTE — Patient Instructions (Addendum)
Schedule your complete physical in 6 months We'll notify you of your lab results and make any changes if needed Continue to work on healthy diet- you look great!! Call with any questions or concerns [place]!!!

## 2018-03-14 NOTE — Assessment & Plan Note (Signed)
Chronic problem.  Well controlled today.  Asymptomatic.  Check labs.  No anticipated med changes.  Will follow. 

## 2018-03-14 NOTE — Assessment & Plan Note (Signed)
Chronic problem.  Currently asymptomatic.  Check labs.  Adjust meds prn  

## 2018-03-14 NOTE — Progress Notes (Signed)
   Subjective:    Patient ID: [name], female    DOB: 10/02/1931, 85 y.o.   MRN: [id number]  HPI HTN- chronic problem, on Atenolol 25mg  w/ good control.  No CP, SOB above baseline, HAs, visual changes, edema.  Hypothyroid- chronic problem, on Levothyroxine 24mcg daily.  No change in energy level.  No changes to skin/hair/nails.  Hypertriglyceridemia- chronic problem, on Fenofibrate 160mg .  No abd pain, N/V.  Very limited physical activity.   Review of Systems For ROS see HPI     Objective:   Physical Exam Vitals signs reviewed.  Constitutional:      General: She is not in acute distress.    Appearance: She is well-developed.  HENT:     Head: Normocephalic and atraumatic.  Eyes:     Conjunctiva/sclera: Conjunctivae normal.     Pupils: Pupils are equal, round, and reactive to light.  Neck:     Musculoskeletal: Normal range of motion and neck supple.     Thyroid: No thyromegaly.  Cardiovascular:     Rate and Rhythm: Normal rate and regular rhythm.     Heart sounds: Normal heart sounds. No murmur.  Pulmonary:     Effort: Pulmonary effort is normal. No respiratory distress.     Breath sounds: Normal breath sounds.  Abdominal:     General: There is no distension.     Palpations: Abdomen is soft.     Tenderness: There is no abdominal tenderness.  Lymphadenopathy:     Cervical: No cervical adenopathy.  Skin:    General: Skin is warm and dry.  Neurological:     General: No focal deficit present.     Mental Status: She is alert.  Psychiatric:        Behavior: Behavior normal.           Assessment & Plan:

## 2018-03-14 NOTE — Addendum Note (Signed)
Addended by: Jodell Cipro on: 03/14/2018 09:37 AM   Modules accepted: Orders

## 2018-03-14 NOTE — Assessment & Plan Note (Signed)
Chronic problem.  Tolerating Fenofibrate w/o difficulty.  Check labs.  Adjust meds prn  °

## 2018-03-15 LAB — BASIC METABOLIC PANEL
BUN/Creatinine Ratio: 23 (calc) — ABNORMAL HIGH (ref 6–22)
BUN: 31 mg/dL — ABNORMAL HIGH (ref 7–25)
CO2: 29 mmol/L (ref 20–32)
Calcium: 9.2 mg/dL (ref 8.6–10.4)
Chloride: 100 mmol/L (ref 98–110)
Creat: 1.32 mg/dL — ABNORMAL HIGH (ref 0.60–0.88)
Glucose, Bld: 119 mg/dL — ABNORMAL HIGH (ref 65–99)
Potassium: 4.4 mmol/L (ref 3.5–5.3)
Sodium: 137 mmol/L (ref 135–146)

## 2018-03-15 LAB — CBC WITH DIFFERENTIAL/PLATELET
Absolute Monocytes: 561 cells/uL (ref 200–950)
Basophils Absolute: 98 cells/uL (ref 0–200)
Basophils Relative: 1.1 %
Eosinophils Absolute: 98 cells/uL (ref 15–500)
Eosinophils Relative: 1.1 %
HCT: 36.1 % (ref 35.0–45.0)
HEMOGLOBIN: 12.4 g/dL (ref 11.7–15.5)
Lymphs Abs: 2723 cells/uL (ref 850–3900)
MCH: 32 pg (ref 27.0–33.0)
MCHC: 34.3 g/dL (ref 32.0–36.0)
MCV: 93.3 fL (ref 80.0–100.0)
MPV: 11.8 fL (ref 7.5–12.5)
Monocytes Relative: 6.3 %
NEUTROS ABS: 5420 {cells}/uL (ref 1500–7800)
Neutrophils Relative %: 60.9 %
Platelets: 393 10*3/uL (ref 140–400)
RBC: 3.87 10*6/uL (ref 3.80–5.10)
RDW: 12.8 % (ref 11.0–15.0)
Total Lymphocyte: 30.6 %
WBC: 8.9 10*3/uL (ref 3.8–10.8)

## 2018-03-15 LAB — HEPATIC FUNCTION PANEL
AG Ratio: 1.4 (calc) (ref 1.0–2.5)
ALBUMIN MSPROF: 3.7 g/dL (ref 3.6–5.1)
ALT: 10 U/L (ref 6–29)
AST: 18 U/L (ref 10–35)
Alkaline phosphatase (APISO): 26 U/L — ABNORMAL LOW (ref 37–153)
Bilirubin, Direct: 0.1 mg/dL (ref 0.0–0.2)
Globulin: 2.6 g/dL (calc) (ref 1.9–3.7)
Indirect Bilirubin: 0.3 mg/dL (calc) (ref 0.2–1.2)
Total Bilirubin: 0.4 mg/dL (ref 0.2–1.2)
Total Protein: 6.3 g/dL (ref 6.1–8.1)

## 2018-03-15 LAB — LIPID PANEL
Cholesterol: 145 mg/dL (ref <200)
HDL: 52 mg/dL (ref >=50)
LDL CHOLESTEROL (CALC): 72 mg/dL
Non-HDL Cholesterol (Calc): 93 mg/dL (calc) (ref <130)
Total CHOL/HDL Ratio: 2.8 (calc) (ref <5.0)
Triglycerides: 124 mg/dL (ref <150)

## 2018-03-15 LAB — TSH: TSH: 1.3 mIU/L (ref 0.40–4.50)

## 2018-03-15 NOTE — Addendum Note (Signed)
Addended by: Doran Clay A on: 03/15/2018 02:30 PM   Modules accepted: Orders

## 2018-03-23 ENCOUNTER — Other Ambulatory Visit: Payer: Self-pay

## 2018-03-23 ENCOUNTER — Other Ambulatory Visit (INDEPENDENT_AMBULATORY_CARE_PROVIDER_SITE_OTHER): Payer: Medicare Other

## 2018-03-23 DIAGNOSIS — R7989 Other specified abnormal findings of blood chemistry: Secondary | ICD-10-CM

## 2018-03-23 LAB — RENAL FUNCTION PANEL
Albumin: 3.6 g/dL (ref 3.5–5.2)
BUN: 31 mg/dL — ABNORMAL HIGH (ref 6–23)
CO2: 29 mEq/L (ref 19–32)
CREATININE: 1.32 mg/dL — AB (ref 0.40–1.20)
Calcium: 8.8 mg/dL (ref 8.4–10.5)
Chloride: 101 mEq/L (ref 96–112)
GFR: 38.31 mL/min — ABNORMAL LOW (ref >60.00)
Glucose, Bld: 94 mg/dL (ref 70–99)
Phosphorus: 3.5 mg/dL (ref 2.3–4.6)
Potassium: 4.7 mEq/L (ref 3.5–5.1)
SODIUM: 138 meq/L (ref 135–145)

## 2018-04-05 ENCOUNTER — Ambulatory Visit: Payer: Medicare Other | Admitting: Nurse Practitioner

## 2018-04-05 ENCOUNTER — Ambulatory Visit: Payer: Medicare Other | Admitting: Neurology

## 2018-05-10 ENCOUNTER — Other Ambulatory Visit: Payer: Self-pay | Admitting: *Deleted

## 2018-05-10 MED ORDER — MEMANTINE HCL 10 MG PO TABS: ORAL_TABLET | ORAL | 1 refills | Status: DC

## 2018-05-29 ENCOUNTER — Telehealth: Payer: Self-pay | Admitting: Family Medicine

## 2018-05-29 MED ORDER — FLUTICASONE PROPIONATE 50 MCG/ACT NA SUSP: NASAL | 2 refills | Status: DC

## 2018-05-29 NOTE — Telephone Encounter (Signed)
Medication filled to pharmacy as requested.   

## 2018-05-29 NOTE — Telephone Encounter (Signed)
Copied from [place] [id number]. Topic: Quick Communication - Rx Refill/Question >> May 29, 2018  9:12 AM Burchel, Abbi R wrote: Medication: fluticasone (FLONASE) 50 MCG/ACT nasal spray   [id number] [place], [place] - [address]  [phone] (Phone) [phone] (Fax)   Pt was advised that RX refills may take up to 3 business days. We ask that you follow-up with your pharmacy.

## 2018-06-01 ENCOUNTER — Telehealth: Payer: Self-pay | Admitting: Nurse Practitioner

## 2018-06-01 NOTE — Telephone Encounter (Signed)
follow up 5-21 daughter Cena Benton called, she gave verbal consent to file insurance for [email or web address] email confirmed [email or web address]  Pt understands that although there may be some limitations with this type of visit, we will take all precautions to reduce any security or privacy concerns.  Pt understands that this will be treated like an in office visit and we will file with pt's insurance, and there may be a patient responsible charge related to this service. *email sent*

## 2018-06-08 ENCOUNTER — Ambulatory Visit (INDEPENDENT_AMBULATORY_CARE_PROVIDER_SITE_OTHER): Payer: Medicare Other | Admitting: Neurology

## 2018-06-08 ENCOUNTER — Other Ambulatory Visit: Payer: Self-pay

## 2018-06-08 DIAGNOSIS — G3184 Mild cognitive impairment, so stated: Secondary | ICD-10-CM | POA: Diagnosis not present

## 2018-06-08 MED ORDER — MEMANTINE HCL 10 MG PO TABS: ORAL_TABLET | ORAL | 4 refills | Status: DC

## 2018-06-08 MED ORDER — DONEPEZIL HCL 10 MG PO TABS: 10.0000 mg | ORAL_TABLET | Freq: Every day | ORAL | 11 refills | Status: DC

## 2018-06-08 NOTE — Progress Notes (Signed)
[facility]  PATIENT: Kristina [name] DOB: Jun 07, 1931    HISTORY OF PRESENT ILLNESS: HISTORY LAYA [name] is 85 years old right-handed female, accompanied by her daughter Maudie Mercury,  seen in refer byher primary care physicians Dr. Tollie Pizza for evaluation of memory loss on September 23 2015   I reviewed and summarized the referring note, she had a history of hypertension, impaired fasting glucose, A1c was 6.5, hypothyroidism, She also had a history of endometrial cancer in 04-25-1998, was treated with hysterectomy, but did not require chemotherapy or radiation therapy, also reported a history of duodenal ulcer many years ago, peripheral neuropathy, numbness in her toes for many years, I evaluate her in January 2016, EMG nerve conduction study then showed no large fiber peripheral neuropathy or lumbar sacral radiculopathy.   I have personally reviewed MRI cervical spine in February 2016, which showed noticeable cervical spondylosis at C5-6, with mild to moderate canal stenosis, but no cord deformity or signal change.   She has hyperreflexia on examination, she denies significant gait abnormality, no bowel and bladder incontinence, she does has occasionally diarrhea, urgency, she denies significant low back pain.     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  Her husband passed away in 04-25-10.   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  She has bilateral knee pain, take aleve every night, sleeps well, she was noted to have less appetite, lost some weight,  She still prepare meal every Sunday, cooking for 6 people.  She does realized that she forget things, names, misplace things, she tends to repeat  questions,  She forgot what disease her husband died from. She still drives, but got lost sometimes.  Her mother does become forgetful in her 17s.    She did have lab evaluation 6 months ago. There was no significant abnormality found  UPDATE Oct 23 2015:  We have personally reviewed MRI of brain without contrast in October 2017, generalized atrophy, especially at the perisyalvian fissure region, mild small vessel disease, chronic left maxillary sphenoid sinusitis She likes to read,but sedentary lifestyle Laboratory evaluation showed normal TSH, B12, CMP,mild elevated C reactive protein, WBC 11 point 7,  Virtual Visit via Video  I connected with [name] on 06/08/18 at  by Video and verified that I am speaking with the correct person using two identifiers.   I discussed the limitations, risks, security and privacy concerns of performing an evaluation and management service by video and the availability of in person appointments. I also discussed with the patient that there may be a patient responsible charge related to this service. The patient expressed understanding and agreed to proceed.   History of Present Illness: She is with her daughter Darrick Meigs, she lives with her daughter and her husband,  not as active, she has no significant gait abnormalities.  Has good appetite, tolerating Namenda 10 mg twice a day     Observations/Objective: I have reviewed problem lists, medications, allergies.  Awake, alert, following commands, rely on her daughter to provide history, not oriented to year and month  Assessment and Plan: Mild cognitive impairment  Slow declining  Continue Namenda 10 mg twice a day  Add  on donepezil 10 mg daily  Encourage moderate exercise  Follow Up Instructions:     I discussed the assessment and treatment plan with the patient. The patient was provided an opportunity to ask questions and all were answered. The patient agreed with the plan and  demonstrated an understanding of the instructions.   The patient was advised to call back or seek an in-person evaluation if the symptoms worsen or if the condition fails to improve as anticipated.  I provided 15 minutes of non-face-to-face time during this encounter.   Marcial Pacas, MD

## 2018-06-09 ENCOUNTER — Encounter: Payer: Self-pay | Admitting: Neurology

## 2018-07-10 ENCOUNTER — Telehealth: Payer: Self-pay | Admitting: Neurology

## 2018-07-10 NOTE — Telephone Encounter (Signed)
I returned the call to the patient's daughter on [place].  Says her mother has been moving slower over the last week along with more confusion.  States the patient is not showing any signs or symptoms of infection (no fever, painful urination, etc) but will keep a close check on her.  She does report her mother just coming back from a week at the beach while staying in an unfamiliar house.  Additionally, the patient lives with her daughter and she was not at the beach with her.  She is aware that being out of her normal environment and routine can also cause temporary, worsening confusion.  She will allow her more time and keep [place] closely updated on her behavior.

## 2018-07-10 NOTE — Telephone Encounter (Signed)
Pts daughter ( kristin webb) is calling in requesting a call back to discuss her moms health  CB# [phone]

## 2018-07-10 NOTE — Telephone Encounter (Signed)
I am placing a packet of resources in the mail to her today.

## 2018-07-13 ENCOUNTER — Other Ambulatory Visit: Payer: Self-pay | Admitting: Family Medicine

## 2018-07-14 ENCOUNTER — Telehealth: Payer: Self-pay | Admitting: Family Medicine

## 2018-07-14 NOTE — Telephone Encounter (Signed)
Patient's daughter [name] is calling to see if she can get an apt with Dr. Birdie Riddle with her mother to discuss medication concerns. Speech is slower, memory concerns, walking not good, and not taking care of herself. [place]- [phone]

## 2018-07-15 ENCOUNTER — Telehealth (HOSPITAL_COMMUNITY): Payer: Self-pay | Admitting: Neurology

## 2018-07-15 ENCOUNTER — Encounter (HOSPITAL_COMMUNITY): Payer: Self-pay | Admitting: Emergency Medicine

## 2018-07-15 ENCOUNTER — Emergency Department (HOSPITAL_COMMUNITY): Payer: Medicare Other

## 2018-07-15 ENCOUNTER — Inpatient Hospital Stay (HOSPITAL_COMMUNITY)
Admission: EM | Admit: 2018-07-15 | Discharge: 2018-07-17 | DRG: 682 | Disposition: A | Discharge status: Home / Self Care | Payer: Medicare Other | Attending: Internal Medicine | Admitting: Internal Medicine

## 2018-07-15 ENCOUNTER — Other Ambulatory Visit: Payer: Self-pay

## 2018-07-15 DIAGNOSIS — R55 Syncope and collapse: Secondary | ICD-10-CM | POA: Diagnosis not present

## 2018-07-15 DIAGNOSIS — J189 Pneumonia, unspecified organism: Secondary | ICD-10-CM | POA: Diagnosis not present

## 2018-07-15 DIAGNOSIS — F0390 Unspecified dementia without behavioral disturbance: Secondary | ICD-10-CM | POA: Diagnosis not present

## 2018-07-15 DIAGNOSIS — Z7951 Long term (current) use of inhaled steroids: Secondary | ICD-10-CM

## 2018-07-15 DIAGNOSIS — R296 Repeated falls: Secondary | ICD-10-CM | POA: Diagnosis present

## 2018-07-15 DIAGNOSIS — R4020 Unspecified coma: Secondary | ICD-10-CM | POA: Diagnosis not present

## 2018-07-15 DIAGNOSIS — R269 Unspecified abnormalities of gait and mobility: Secondary | ICD-10-CM

## 2018-07-15 DIAGNOSIS — K219 Gastro-esophageal reflux disease without esophagitis: Secondary | ICD-10-CM | POA: Diagnosis present

## 2018-07-15 DIAGNOSIS — G9340 Encephalopathy, unspecified: Secondary | ICD-10-CM | POA: Diagnosis not present

## 2018-07-15 DIAGNOSIS — Z8249 Family history of ischemic heart disease and other diseases of the circulatory system: Secondary | ICD-10-CM

## 2018-07-15 DIAGNOSIS — Z79890 Hormone replacement therapy (postmenopausal): Secondary | ICD-10-CM

## 2018-07-15 DIAGNOSIS — G9341 Metabolic encephalopathy: Secondary | ICD-10-CM | POA: Diagnosis present

## 2018-07-15 DIAGNOSIS — I10 Essential (primary) hypertension: Secondary | ICD-10-CM | POA: Diagnosis not present

## 2018-07-15 DIAGNOSIS — N179 Acute kidney failure, unspecified: Secondary | ICD-10-CM | POA: Diagnosis not present

## 2018-07-15 DIAGNOSIS — R531 Weakness: Secondary | ICD-10-CM

## 2018-07-15 DIAGNOSIS — H8110 Benign paroxysmal vertigo, unspecified ear: Secondary | ICD-10-CM | POA: Diagnosis not present

## 2018-07-15 DIAGNOSIS — E039 Hypothyroidism, unspecified: Secondary | ICD-10-CM | POA: Diagnosis present

## 2018-07-15 DIAGNOSIS — R4182 Altered mental status, unspecified: Secondary | ICD-10-CM | POA: Diagnosis not present

## 2018-07-15 DIAGNOSIS — K5790 Diverticulosis of intestine, part unspecified, without perforation or abscess without bleeding: Secondary | ICD-10-CM | POA: Diagnosis present

## 2018-07-15 DIAGNOSIS — Z20828 Contact with and (suspected) exposure to other viral communicable diseases: Secondary | ICD-10-CM | POA: Diagnosis not present

## 2018-07-15 DIAGNOSIS — E870 Hyperosmolality and hypernatremia: Secondary | ICD-10-CM | POA: Diagnosis present

## 2018-07-15 DIAGNOSIS — M47814 Spondylosis without myelopathy or radiculopathy, thoracic region: Secondary | ICD-10-CM | POA: Diagnosis not present

## 2018-07-15 DIAGNOSIS — E0590 Thyrotoxicosis, unspecified without thyrotoxic crisis or storm: Secondary | ICD-10-CM | POA: Diagnosis present

## 2018-07-15 DIAGNOSIS — D72829 Elevated white blood cell count, unspecified: Secondary | ICD-10-CM | POA: Diagnosis present

## 2018-07-15 DIAGNOSIS — Z7952 Long term (current) use of systemic steroids: Secondary | ICD-10-CM | POA: Diagnosis not present

## 2018-07-15 DIAGNOSIS — R410 Disorientation, unspecified: Secondary | ICD-10-CM | POA: Diagnosis present

## 2018-07-15 DIAGNOSIS — E860 Dehydration: Secondary | ICD-10-CM | POA: Diagnosis not present

## 2018-07-15 DIAGNOSIS — Z1159 Encounter for screening for other viral diseases: Secondary | ICD-10-CM | POA: Diagnosis not present

## 2018-07-15 LAB — COMPREHENSIVE METABOLIC PANEL
ALT: 19 U/L (ref 0–44)
AST: 70 U/L — ABNORMAL HIGH (ref 15–41)
Albumin: 3.2 g/dL — ABNORMAL LOW (ref 3.5–5.0)
Alkaline Phosphatase: 27 U/L — ABNORMAL LOW (ref 38–126)
Anion gap: 13 (ref 5–15)
BUN: 51 mg/dL — ABNORMAL HIGH (ref 8–23)
CO2: 22 mmol/L (ref 22–32)
Calcium: 9.2 mg/dL (ref 8.9–10.3)
Chloride: 111 mmol/L (ref 98–111)
Creatinine, Ser: 1.86 mg/dL — ABNORMAL HIGH (ref 0.44–1.00)
GFR calc Af Amer: 28 mL/min — ABNORMAL LOW (ref >60)
GFR calc non Af Amer: 24 mL/min — ABNORMAL LOW (ref >60)
Glucose, Bld: 161 mg/dL — ABNORMAL HIGH (ref 70–99)
Potassium: 4.5 mmol/L (ref 3.5–5.1)
Sodium: 146 mmol/L — ABNORMAL HIGH (ref 135–145)
Total Bilirubin: 1.4 mg/dL — ABNORMAL HIGH (ref 0.3–1.2)
Total Protein: 6.7 g/dL (ref 6.5–8.1)

## 2018-07-15 LAB — CBC WITH DIFFERENTIAL/PLATELET
Abs Immature Granulocytes: 0.08 10*3/uL — ABNORMAL HIGH (ref 0.00–0.07)
Basophils Absolute: 0.1 10*3/uL (ref 0.0–0.1)
Basophils Relative: 0 %
Eosinophils Absolute: 0 10*3/uL (ref 0.0–0.5)
Eosinophils Relative: 0 %
HCT: 38.1 % (ref 36.0–46.0)
Hemoglobin: 12.3 g/dL (ref 12.0–15.0)
Immature Granulocytes: 1 %
Lymphocytes Relative: 16 %
Lymphs Abs: 2.4 10*3/uL (ref 0.7–4.0)
MCH: 31.7 pg (ref 26.0–34.0)
MCHC: 32.3 g/dL (ref 30.0–36.0)
MCV: 98.2 fL (ref 80.0–100.0)
Monocytes Absolute: 0.9 10*3/uL (ref 0.1–1.0)
Monocytes Relative: 6 %
Neutro Abs: 11.9 10*3/uL — ABNORMAL HIGH (ref 1.7–7.7)
Neutrophils Relative %: 77 %
Platelets: 254 10*3/uL (ref 150–400)
RBC: 3.88 MIL/uL (ref 3.87–5.11)
RDW: 14.7 % (ref 11.5–15.5)
WBC: 15.2 10*3/uL — ABNORMAL HIGH (ref 4.0–10.5)
nRBC: 0 % (ref 0.0–0.2)

## 2018-07-15 LAB — POCT I-STAT EG7
Acid-Base Excess: 5 mmol/L — ABNORMAL HIGH (ref 0.0–2.0)
Bicarbonate: 28 mmol/L (ref 20.0–28.0)
Calcium, Ion: 1.14 mmol/L — ABNORMAL LOW (ref 1.15–1.40)
HCT: 38 % (ref 36.0–46.0)
Hemoglobin: 12.9 g/dL (ref 12.0–15.0)
O2 Saturation: 97 %
Potassium: 4.6 mmol/L (ref 3.5–5.1)
Sodium: 149 mmol/L — ABNORMAL HIGH (ref 135–145)
TCO2: 29 mmol/L (ref 22–32)
pCO2, Ven: 33.1 mmHg — ABNORMAL LOW (ref 44.0–60.0)
pH, Ven: 7.535 — ABNORMAL HIGH (ref 7.250–7.430)
pO2, Ven: 79 mmHg — ABNORMAL HIGH (ref 32.0–45.0)

## 2018-07-15 LAB — URINALYSIS, ROUTINE W REFLEX MICROSCOPIC
Bacteria, UA: NONE SEEN
Bilirubin Urine: NEGATIVE
Glucose, UA: NEGATIVE mg/dL
Ketones, ur: NEGATIVE mg/dL
Leukocytes,Ua: NEGATIVE
Nitrite: NEGATIVE
Protein, ur: 30 mg/dL — AB
Specific Gravity, Urine: 1.028 (ref 1.005–1.030)
pH: 5 (ref 5.0–8.0)

## 2018-07-15 LAB — CREATININE, SERUM
Creatinine, Ser: 1.68 mg/dL — ABNORMAL HIGH (ref 0.44–1.00)
GFR calc Af Amer: 32 mL/min — ABNORMAL LOW (ref >60)
GFR calc non Af Amer: 28 mL/min — ABNORMAL LOW (ref >60)

## 2018-07-15 LAB — CBC
HCT: 37.9 % (ref 36.0–46.0)
Hemoglobin: 12.2 g/dL (ref 12.0–15.0)
MCH: 31.6 pg (ref 26.0–34.0)
MCHC: 32.2 g/dL (ref 30.0–36.0)
MCV: 98.2 fL (ref 80.0–100.0)
Platelets: 267 10*3/uL (ref 150–400)
RBC: 3.86 MIL/uL — ABNORMAL LOW (ref 3.87–5.11)
RDW: 14.6 % (ref 11.5–15.5)
WBC: 14.8 10*3/uL — ABNORMAL HIGH (ref 4.0–10.5)
nRBC: 0 % (ref 0.0–0.2)

## 2018-07-15 LAB — LACTIC ACID, PLASMA
Lactic Acid, Venous: 2.2 mmol/L (ref 0.5–1.9)
Lactic Acid, Venous: 1.5 mmol/L (ref 0.5–1.9)

## 2018-07-15 LAB — CBG MONITORING, ED: Glucose-Capillary: 136 mg/dL — ABNORMAL HIGH (ref 70–99)

## 2018-07-15 LAB — AMMONIA: Ammonia: 29 umol/L (ref 9–35)

## 2018-07-15 LAB — SARS CORONAVIRUS 2 BY RT PCR (HOSPITAL ORDER, PERFORMED IN ~~LOC~~ HOSPITAL LAB): SARS Coronavirus 2: NEGATIVE

## 2018-07-15 LAB — ETHANOL: Alcohol, Ethyl (B): <10 mg/dL (ref <10)

## 2018-07-15 LAB — TSH: TSH: 0.162 u[IU]/mL — ABNORMAL LOW (ref 0.350–4.500)

## 2018-07-15 MED ORDER — IBUPROFEN 200 MG PO TABS: 400.0000 mg | ORAL_TABLET | Freq: Four times a day (QID) | ORAL | Status: DC | PRN

## 2018-07-15 MED ORDER — SODIUM CHLORIDE 0.9% FLUSH
3.0000 mL | Freq: Two times a day (BID) | INTRAVENOUS | Status: DC
  Administered 2018-07-15 – 2018-07-17 (×5): 3 mL via INTRAVENOUS

## 2018-07-15 MED ORDER — POLYVINYL ALCOHOL 1.4 % OP SOLN
1.0000 [drp] | Freq: Every day | OPHTHALMIC | Status: DC
  Administered 2018-07-15 – 2018-07-16 (×2): 1 [drp] via OPHTHALMIC
  Filled 2018-07-15 (×2): qty 15

## 2018-07-15 MED ORDER — POLYETHYLENE GLYCOL 3350 17 G PO PACK: 17.0000 g | PACK | Freq: Every day | ORAL | Status: DC | PRN

## 2018-07-15 MED ORDER — ONDANSETRON HCL 4 MG PO TABS: 4.0000 mg | ORAL_TABLET | Freq: Four times a day (QID) | ORAL | Status: DC | PRN

## 2018-07-15 MED ORDER — LEVOTHYROXINE SODIUM 75 MCG PO TABS
75.0000 ug | ORAL_TABLET | Freq: Every day | ORAL | Status: DC
  Administered 2018-07-16 – 2018-07-17 (×2): 75 ug via ORAL
  Filled 2018-07-15 (×2): qty 1

## 2018-07-15 MED ORDER — ACETAMINOPHEN 650 MG RE SUPP: 650.0000 mg | Freq: Four times a day (QID) | RECTAL | Status: DC | PRN

## 2018-07-15 MED ORDER — ACETAMINOPHEN 325 MG PO TABS: 650.0000 mg | ORAL_TABLET | Freq: Four times a day (QID) | ORAL | Status: DC | PRN

## 2018-07-15 MED ORDER — ADULT MULTIVITAMIN W/MINERALS CH
1.0000 | ORAL_TABLET | Freq: Every day | ORAL | Status: DC
  Administered 2018-07-15 – 2018-07-17 (×3): 1 via ORAL
  Filled 2018-07-15 (×3): qty 1

## 2018-07-15 MED ORDER — SODIUM CHLORIDE 0.9 % IV BOLUS
1000.0000 mL | Freq: Once | INTRAVENOUS | Status: AC
  Administered 2018-07-15: 18:00:00 1000 mL via INTRAVENOUS

## 2018-07-15 MED ORDER — FLUTICASONE PROPIONATE 50 MCG/ACT NA SUSP
2.0000 | Freq: Every day | NASAL | Status: DC | PRN
  Filled 2018-07-15: qty 16

## 2018-07-15 MED ORDER — ATENOLOL 25 MG PO TABS
25.0000 mg | ORAL_TABLET | Freq: Every day | ORAL | Status: DC
  Administered 2018-07-16 – 2018-07-17 (×2): 25 mg via ORAL
  Filled 2018-07-15 (×2): qty 1

## 2018-07-15 MED ORDER — POTASSIUM CHLORIDE IN NACL 20-0.9 MEQ/L-% IV SOLN
INTRAVENOUS | Status: AC
  Administered 2018-07-15 – 2018-07-16 (×3): via INTRAVENOUS
  Filled 2018-07-15 (×2): qty 1000

## 2018-07-15 MED ORDER — ENOXAPARIN SODIUM 30 MG/0.3ML ~~LOC~~ SOLN
30.0000 mg | SUBCUTANEOUS | Status: DC
  Administered 2018-07-15 – 2018-07-16 (×2): 30 mg via SUBCUTANEOUS
  Filled 2018-07-15 (×2): qty 0.3

## 2018-07-15 MED ORDER — FENOFIBRATE 160 MG PO TABS
160.0000 mg | ORAL_TABLET | Freq: Every day | ORAL | Status: DC
  Administered 2018-07-16 – 2018-07-17 (×2): 160 mg via ORAL
  Filled 2018-07-15 (×2): qty 1

## 2018-07-15 MED ORDER — ONDANSETRON HCL 4 MG/2ML IJ SOLN: 4.0000 mg | Freq: Four times a day (QID) | INTRAMUSCULAR | Status: DC | PRN

## 2018-07-15 NOTE — H&P (Signed)
History and Physical    [name] BVQ:[id number] DOB: [birth year] DOA: 07/15/2018  PCP: Midge Minium, MD  Patient coming from: Home  I have personally briefly reviewed patient's old medical records in [place]  Chief Complaint: Worsening confusion and multiple falls at home  HPI: [place] Gfeller is a 85 y.o. female with medical history significant of benign positional vertigo, diverticulosis, GERD, hypertension, hypothyroidism, and peripheral neuropathy who presents to the emergency department with a chief complaint of generalized weakness increased fatigue and frequent falls.  Daughter reports is been going on for about 2 weeks.  It has been worsening for that same amount of time it apparently the patient started taking milligrams p.o. daily and since then family has noted a significant decline in her functional status.  Has had multiple falls at home.  She has increased fatigue and not wanting to eat or drink.  Her gait is extremely unsteady.  He has a history of mild cognitive impairment and sees neurologist as an outpatient.  At her last neurology visit Aricept was added to her Namenda and since then she has had some changes.  ED Course: The about 0.5, 1.3 to now 1.86.  X-ray shows no acute cardiopulmonary abnormality, glucose 161, GFR 24, lactate is 2.2, white blood cell count elevated at 15, TSH suppressed at 0.162 (patient has a history of hypothyroidism and is on Synthroid) patient oriented to self but not day date time year president or location  Review of Systems: Unable to obtain due to patient's confusion  Past Medical History:  Diagnosis Date  . Benign paroxysmal positional vertigo   . Diverticulosis   . Duodenal ulcer   . Endometrial cancer [facility])    age 47  . GERD (gastroesophageal reflux disease)   . Hypertension   . Hypothyroidism   . Memory loss   . Peripheral neuropathy   . Tubulovillous adenoma polyp of colon 04/1998    Past Surgical History:   Procedure Laterality Date  . CATARACT EXTRACTION    . CHOLECYSTECTOMY    . FOOT SURGERY     Right  . KNEE SURGERY     Right  . VAGINAL HYSTERECTOMY      Social History   Social History Narrative   Lives with her daughter, [name].   Widowed.   Retired.   Right hand.   Three children.   High school education.   2 cups coffee daily.     reports that she has never smoked. She has never used smokeless tobacco. She reports that she does not drink alcohol or use drugs.  Allergies  Allergen Reactions  . Bee Venom Shortness Of Breath, Itching and Rash    Almost passed out, also  . Penicillins Rash    Has patient had a PCN reaction causing immediate rash, facial/tongue/throat swelling, SOB or lightheadedness with hypotension: Yes Has patient had a PCN reaction causing severe rash involving mucus membranes or skin necrosis: No Has patient had a PCN reaction that required hospitalization: Unknown Has patient had a PCN reaction occurring within the last 10 years: No If all of the above answers are "NO", then may proceed with Cephalosporin use.     Family History  Problem Relation Age of Onset  . Dementia Mother   . Heart attack Father   . Heart disease Sister   . Breast cancer Sister 30  . Cancer Brother   . Colon cancer Neg Hx      Prior to Admission medications  Medication Sig Start Date End Date Taking? Authorizing Provider  atenolol (TENORMIN) 25 MG tablet TAKE 1 TABLET BY MOUTH EVERY DAY Patient taking differently: Take 25 mg by mouth daily.  07/13/18   Midge Minium, MD  [employer] D3 50 MCG (2000 UT) CAPS TAKE 1 CAPSULE DAILY Patient taking differently: Take 2,000 Units by mouth daily.  01/02/18   Midge Minium, MD  diphenhydrAMINE (BENADRYL) 25 MG tablet Take 2 tablets (50 mg total) by mouth every 6 (six) hours as needed for itching (or hives). 06/14/16   Forde Dandy, MD  donepezil (ARICEPT) 10 MG tablet Take 1 tablet (10 mg total) by mouth at bedtime. 06/08/18    Marcial Pacas, MD  EPINEPHrine 0.3 mg/0.3 mL IJ SOAJ injection Inject 0.3 mLs (0.3 mg total) into the muscle once as needed for up to 1 dose (severe allergic reaction). 09/07/17   Midge Minium, MD  fenofibrate 160 MG tablet TAKE 1 TABLET BY MOUTH EVERY DAY 11/28/17   Midge Minium, MD  fluticasone [facility]) 50 MCG/ACT nasal spray SPRAY 2 SPRAYS INTO EACH NOSTRIL EVERY DAY 05/29/18   Midge Minium, MD  levothyroxine (SYNTHROID, LEVOTHROID) 75 MCG tablet TAKE 1 TABLET BY MOUTH EVERY DAY 03/13/18   Midge Minium, MD  loperamide (IMODIUM) 2 MG capsule Take 2 mg by mouth at bedtime.     [provider]  memantine (NAMENDA) 10 MG tablet TAKE 1 TABLET BY MOUTH TWICE A DAY. 06/08/18   Marcial Pacas, MD  naproxen sodium (ANAPROX) 220 MG tablet Take 220 mg by mouth at bedtime.     [provider]  predniSONE (DELTASONE) 10 MG tablet 3 tabs x3 days and then 2 tabs x3 days and then 1 tab x3 days.  Take w/ food. 11/15/17   Midge Minium, MD  triamcinolone ointment (KENALOG) 0.1 % APPLY TO AFFECTED AREA TWICE DAILY Patient taking differently: Apply 1 application topically 2 (two) times daily as needed (as directed, to affected areas).  08/16/17   Midge Minium, MD    Physical Exam:  Constitutional: NAD, calm, comfortable very confused pulling at lines and drains Vitals:   07/15/18 1303 07/15/18 1305 07/15/18 1345 07/15/18 1400  BP: 135/62   (!) 131/52  Pulse:  (!) 55 (!) 59 (!) 57  Resp:  16 17 17   Temp:      TempSrc:      SpO2:  96% 96% 96%   Eyes: PERRL, lids and conjunctivae normal ENMT: Mucous membranes are dry. Posterior pharynx clear of any exudate or lesions.Normal dentition.  Neck: normal, supple, no masses, no thyromegaly Respiratory: clear to auscultation bilaterally, no wheezing, no crackles. Normal respiratory effort. No accessory muscle use.  Cardiovascular: Regular rate and rhythm, no murmurs / rubs / gallops. No extremity edema. 2+ pedal  pulses. No carotid bruits.  Abdomen: no tenderness, no masses palpated. No hepatosplenomegaly. Bowel sounds positive.  Musculoskeletal: no clubbing / cyanosis. No joint deformity upper and lower extremities. Good ROM, no contractures. Normal muscle tone.  Skin: no rashes, lesions, ulcers. No induration Neurologic: CN 2-12 grossly intact. Sensation intact, DTR normal. Strength 5/5 in all 4.  Psychiatric: Alert and oriented to name only not to day date time president or location.  Mood is agitated.  Judgment and insight are impaired.   Labs on Admission: I have personally reviewed following labs and imaging studies  CBC: Recent Labs  Lab 07/15/18 1212 07/15/18 1215  WBC  --  15.2*  NEUTROABS  --  11.9*  HGB 12.9 12.3  HCT 38.0 38.1  MCV  --  98.2  PLT  --  734   Basic Metabolic Panel: Recent Labs  Lab 07/15/18 1212 07/15/18 1215  NA 149* 146*  K 4.6 4.5  CL  --  111  CO2  --  22  GLUCOSE  --  161*  BUN  --  51*  CREATININE  --  1.86*  CALCIUM  --  9.2   Liver Function Tests: Recent Labs  Lab 07/15/18 1215  AST 70*  ALT 19  ALKPHOS 27*  BILITOT 1.4*  PROT 6.7  ALBUMIN 3.2*    Recent Labs  Lab 07/15/18 1215  AMMONIA 29   CBG: Recent Labs  Lab 07/15/18 1213  GLUCAP 136*   Thyroid Function Tests: Recent Labs    07/15/18 1215  TSH 0.162*   Urine analysis:    Component Value Date/Time   COLORURINE YELLOW 07/15/2018 1355   APPEARANCEUR HAZY (A) 07/15/2018 1355   LABSPEC 1.028 07/15/2018 1355   PHURINE 5.0 07/15/2018 1355   GLUCOSEU NEGATIVE 07/15/2018 1355   HGBUR SMALL (A) 07/15/2018 1355   BILIRUBINUR NEGATIVE 07/15/2018 [place] 07/15/2018 1355   PROTEINUR 30 (A) 07/15/2018 1355   NITRITE NEGATIVE 07/15/2018 1355   LEUKOCYTESUR NEGATIVE 07/15/2018 1355    Radiological Exams on Admission: Dg Chest 2 View  Result Date: 07/15/2018 CLINICAL DATA:  Altered mental status.  Multiple falls. EXAM: CHEST - 2 VIEW COMPARISON:  None.  FINDINGS: Multiple monitoring leads overlie the patient. Normal cardiac and mediastinal contours. No consolidative pulmonary opacities. No pleural effusion or pneumothorax. Thoracic spine degenerative changes. IMPRESSION: No acute cardiopulmonary process. Electronically Signed   By: Lovey Newcomer M.D.   On: 07/15/2018 12:38   Ct Head Wo Contrast  Result Date: 07/15/2018 CLINICAL DATA:  Altered level of consciousness. EXAM: CT HEAD WITHOUT CONTRAST TECHNIQUE: Contiguous axial images were obtained from the base of the skull through the vertex without intravenous contrast. COMPARISON:  None. FINDINGS: Brain: Mild chronic ischemic white matter disease is noted. No mass effect or midline shift is noted. Ventricular size is within normal limits. There is no evidence of mass lesion, hemorrhage or acute infarction. Vascular: No hyperdense vessel or unexpected calcification. Skull: Normal. Negative for fracture or focal lesion. Sinuses/Orbits: Left maxillary and sphenoid sinusitis is noted. Other: None. IMPRESSION: Mild chronic ischemic white matter disease. No acute intracranial abnormality seen. Electronically Signed   By: Marijo Conception M.D.   On: 07/15/2018 14:04      Assessment/Plan Principal Problem:   AKI (acute kidney injury) ([place]) Active Problems:   Dehydration   Encephalopathy acute   Weakness   Confusion   Hyperthyroidism   Leukocytosis   Mild cognitive impairment   Hypothyroidism   Abnormality of gait   1.  Dehydration with acute kidney injury: Patient will be placed in the hospital and hydrated.  Check orthostatics in a.m.  2.  Acute encephalopathy Associated with weakness and confusion: Concern for possible subacute stroke that may have occurred since starting new medication.  Likely not related to medication and positive.  Will check MRI.  Neurology has been consulted.  I personally spoke with Dr. Cheral Marker who reviewed the chart.  He request to be recalled in the a.m. to be notified of  consult.  3.  Hyperthyroidism related to repletion of right hormone with levothyroxine patient who is hypothyroid: We will hold Synthroid and restart at discharge at a lower dose of 50  mcg p.o. daily.  4.  Leukocytosis: Etiology is unclear we will repeat CBC in a.m.  Evidence of urine or pulmonary infection.  5.  Cognitive impairment: Noted dehydration may have made it much worse.  Next.  Abnormality of gait: Patient with falls will have PT and OT evaluate the patient.  DVT prophylaxis: Lovenox Code Status: Full code Family Communication: Spoke with patient's daughter by telephone.  Updated her of patient's condition. Disposition Plan: Likely home in 24 to 48 hours Consults called: Neurology please recall in a.m. per request of Dr. Cheral Marker Admission status: Observation  It is my clinical opinion that referral for OBSERVATION is reasonable and necessary in this patient based on the above information provided. The aforementioned taken together are felt to place the patient at high risk for further clinical deterioration. However it is anticipated that the patient may be medically stable for discharge from the hospital within 24 to 48 hours.  Lady Deutscher MD FACP [facility] Pager [phone]  How to contact the [facility] Attending or Consulting provider [place] or covering provider during after hours [place], for this patient?  1. Check the care team in [facility] and look for a) attending/consulting [facility] provider listed and b) the [facility] team listed 2. Log into [email or web address] and use [facility]'s universal password to access. If you do not have the password, please contact the hospital operator. 3. Locate the [facility] provider you are looking for under [facility] and page to a number that you can be directly reached. 4. If you still have difficulty reaching the provider, please page the [facility] (Director on Call) for the Hospitalists listed on amion for assistance.  If 7PM-7AM, please contact  night-coverage [email or web address] Password [facility]  07/15/2018, 3:36 PM

## 2018-07-15 NOTE — ED Notes (Signed)
Patient transported to X-ray 

## 2018-07-15 NOTE — ED Provider Notes (Signed)
[facility] EMERGENCY DEPARTMENT Provider Note   CSN: [id number] Arrival date & time: 07/15/18  1132    History   Chief Complaint Chief Complaint  Patient presents with  . Memory Loss  . Fall    HPI [name] H Spader is a 85 y.o. female.     85 yo F with a chief complaint of generalized weakness increased fatigue and frequent falls.  Going on for about 2 weeks now.  Gradually worsening of about the same amount of time.  Had a fall last night and they think that she struck the back of her head.  Patient is slow to respond and states she has no complaints.  Level 5 caveat dementia.  Per the daughter who provides most of the history the patient lives at home with them.  Since starting a new medicine a few weeks ago had noticed the symptoms.  Increased fatigue not wanting to eat or drink unsteadiness of her gait.  Had a history of mild cognitive impairment and sees a neurologist as an outpatient.  Was recently started on Aricept.  No other new medication changes.  No obvious symptom complaints from the patient per the family no chest pain or shortness of breath no abdominal pain vomiting diarrhea no fevers.  No urinary symptoms.  Has been newly incontinent.  They have not noticed any one-sided weakness.  Felt that her speech is slowed and sometimes slurred at times.  The history is provided by the patient and a relative.  Fall Pertinent negatives include no chest pain, no headaches and no shortness of breath.  Illness Severity:  Moderate Onset quality:  Gradual Duration:  2 weeks Timing:  Constant Progression:  Worsening Chronicity:  New Associated symptoms: fatigue   Associated symptoms: no chest pain, no congestion, no fever, no headaches, no myalgias, no nausea, no rhinorrhea, no shortness of breath, no vomiting and no wheezing     Past Medical History:  Diagnosis Date  . Benign paroxysmal positional vertigo   . Diverticulosis   . Duodenal ulcer   .  Endometrial cancer [facility])    age 47  . GERD (gastroesophageal reflux disease)   . Hypertension   . Hypothyroidism   . Memory loss   . Peripheral neuropathy   . Tubulovillous adenoma polyp of colon 04/1998    Patient Active Problem List   Diagnosis Date Noted  . Weakness 07/15/2018  . Dehydration 07/15/2018  . AKI (acute kidney injury) ([place]) 07/15/2018  . Confusion 07/15/2018  . Encephalopathy acute 07/15/2018  . Hyperthyroidism 07/15/2018  . Leukocytosis 07/15/2018  . Hypertriglyceridemia 02/07/2017  . Vitamin D deficiency 08/06/2016  . Physical exam 08/06/2016  . Abnormality of gait 04/23/2016  . Hypothyroidism 02/05/2016  . HTN (hypertension) 02/05/2016  . Mild cognitive impairment 09/23/2015  . Paresthesia 01/28/2014  . Hyperreflexia 01/28/2014  . ABDOMINAL PAIN-RUQ 09/20/2008  . GERD 01/16/2008  . OTHER POSTOPERATIVE FUNCTIONAL DISORDERS 01/16/2008  . DIARRHEA 01/16/2008  . TUBULOVILLOUS ADENOMA, COLON, HX OF 01/10/2008    Past Surgical History:  Procedure Laterality Date  . CATARACT EXTRACTION    . CHOLECYSTECTOMY    . FOOT SURGERY     Right  . KNEE SURGERY     Right  . VAGINAL HYSTERECTOMY       OB History   No obstetric history on file.      Home Medications    Prior to Admission medications   Medication Sig Start Date End Date Taking? Authorizing Provider  atenolol (TENORMIN)  25 MG tablet TAKE 1 TABLET BY MOUTH EVERY DAY Patient taking differently: Take 25 mg by mouth daily.  07/13/18  Yes Midge Minium, MD  [employer] D3 50 MCG (2000 UT) CAPS TAKE 1 CAPSULE DAILY Patient taking differently: Take 2,000 Units by mouth daily.  01/02/18  Yes Midge Minium, MD  diphenhydrAMINE (BENADRYL) 25 MG tablet Take 2 tablets (50 mg total) by mouth every 6 (six) hours as needed for itching (or hives). 06/14/16  Yes Forde Dandy, MD  donepezil (ARICEPT) 10 MG tablet Take 1 tablet (10 mg total) by mouth at bedtime. 06/08/18  Yes Marcial Pacas, MD  EPINEPHrine 0.3  mg/0.3 mL IJ SOAJ injection Inject 0.3 mLs (0.3 mg total) into the muscle once as needed for up to 1 dose (severe allergic reaction). 09/07/17  Yes Midge Minium, MD  fenofibrate 160 MG tablet TAKE 1 TABLET BY MOUTH EVERY DAY Patient taking differently: Take 160 mg by mouth daily.  11/28/17  Yes Midge Minium, MD  levothyroxine (SYNTHROID, LEVOTHROID) 75 MCG tablet TAKE 1 TABLET BY MOUTH EVERY DAY Patient taking differently: Take 75 mcg by mouth daily before breakfast.  03/13/18  Yes Midge Minium, MD  loperamide (IMODIUM) 2 MG capsule Take 2 mg by mouth at bedtime.    Yes [provider]  memantine (NAMENDA) 10 MG tablet TAKE 1 TABLET BY MOUTH TWICE A DAY. Patient taking differently: Take 10 mg by mouth 2 (two) times daily.  06/08/18  Yes Marcial Pacas, MD  naproxen sodium (ANAPROX) 220 MG tablet Take 220 mg by mouth at bedtime.    Yes [provider]  Polyethyl Glyc-Propyl Glyc PF (SYSTANE PRESERVATIVE FREE) 0.4-0.3 % SOLN Place 1-2 drops into both eyes at bedtime.   Yes [provider]  triamcinolone ointment (KENALOG) 0.1 % APPLY TO AFFECTED AREA TWICE DAILY Patient taking differently: Apply 1 application topically 2 (two) times daily as needed (as directed, to affected areas).  08/16/17  Yes Midge Minium, MD  fluticasone (FLONASE) 50 MCG/ACT nasal spray SPRAY 2 SPRAYS INTO EACH NOSTRIL EVERY DAY Patient taking differently: Place 2 sprays into both nostrils daily as needed (for seasonal allergies). SPRAY 2 SPRAYS INTO EACH NOSTRIL EVERY DAY 05/29/18   Midge Minium, MD    Family History Family History  Problem Relation Age of Onset  . Dementia Mother   . Heart attack Father   . Heart disease Sister   . Breast cancer Sister 32  . Cancer Brother   . Colon cancer Neg Hx     Social History Social History   Tobacco Use  . Smoking status: Never Smoker  . Smokeless tobacco: Never Used  Substance Use Topics  . Alcohol use: No     Alcohol/week: 0.0 standard drinks  . Drug use: No     Allergies   Bee venom and Penicillins   Review of Systems Review of Systems  Constitutional: Positive for fatigue. Negative for chills and fever.  HENT: Negative for congestion and rhinorrhea.   Eyes: Negative for redness and visual disturbance.  Respiratory: Negative for shortness of breath and wheezing.   Cardiovascular: Negative for chest pain and palpitations.  Gastrointestinal: Negative for nausea and vomiting.  Genitourinary: Negative for dysuria and urgency.  Musculoskeletal: Negative for arthralgias and myalgias.  Skin: Negative for pallor and wound.  Neurological: Positive for speech difficulty and weakness (generalized). Negative for dizziness and headaches.     Physical Exam Updated Vital Signs BP (!) 135/57  Pulse (!) 53   Temp (!) 97.5 F (36.4 C) (Oral)   Resp 12   Ht 5\' 2"  (1.575 m)   Wt 66.4 kg   SpO2 99%   BMI 26.77 kg/m   Physical Exam Vitals signs and nursing note reviewed.  Constitutional:      General: She is not in acute distress.    Appearance: She is well-developed. She is not diaphoretic.  HENT:     Head: Normocephalic and atraumatic.  Eyes:     Pupils: Pupils are equal, round, and reactive to light.  Neck:     Musculoskeletal: Normal range of motion and neck supple.  Cardiovascular:     Rate and Rhythm: Normal rate and regular rhythm.     Heart sounds: No murmur. No friction rub. No gallop.   Pulmonary:     Effort: Pulmonary effort is normal.     Breath sounds: No wheezing or rales.  Abdominal:     General: There is no distension.     Palpations: Abdomen is soft.     Tenderness: There is no abdominal tenderness.  Musculoskeletal:        General: No tenderness.  Skin:    General: Skin is warm and dry.  Neurological:     Mental Status: She is alert and oriented to person, place, and time.     GCS: GCS eye subscore is 4. GCS verbal subscore is 4. GCS motor subscore is 6.      Comments: Patient will not look upward with her eyes, I wonder if this is some confusion and instruction.  Globally weak.  No other noted deficit.  Psychiatric:        Behavior: Behavior normal.      ED Treatments / Results  Labs (all labs ordered are listed, but only abnormal results are displayed) Labs Reviewed  COMPREHENSIVE METABOLIC PANEL - Abnormal; Notable for the following components:      Result Value   Sodium 146 (*)    Glucose, Bld 161 (*)    BUN 51 (*)    Creatinine, Ser 1.86 (*)    Albumin 3.2 (*)    AST 70 (*)    Alkaline Phosphatase 27 (*)    Total Bilirubin 1.4 (*)    GFR calc non Af Amer 24 (*)    GFR calc Af Amer 28 (*)    All other components within normal limits  CBC WITH DIFFERENTIAL/PLATELET - Abnormal; Notable for the following components:   WBC 15.2 (*)    Neutro Abs 11.9 (*)    Abs Immature Granulocytes 0.08 (*)    All other components within normal limits  URINALYSIS, ROUTINE W REFLEX MICROSCOPIC - Abnormal; Notable for the following components:   APPearance HAZY (*)    Hgb urine dipstick SMALL (*)    Protein, ur 30 (*)    All other components within normal limits  TSH - Abnormal; Notable for the following components:   TSH 0.162 (*)    All other components within normal limits  LACTIC ACID, PLASMA - Abnormal; Notable for the following components:   Lactic Acid, Venous 2.2 (*)    All other components within normal limits  CBC - Abnormal; Notable for the following components:   WBC 14.8 (*)    RBC 3.86 (*)    All other components within normal limits  CREATININE, SERUM - Abnormal; Notable for the following components:   Creatinine, Ser 1.68 (*)    GFR calc non Af Amer 28 (*)  GFR calc Af Amer 32 (*)    All other components within normal limits  CBG MONITORING, ED - Abnormal; Notable for the following components:   Glucose-Capillary 136 (*)    All other components within normal limits  POCT I-STAT EG7 - Abnormal; Notable for the following  components:   pH, Ven 7.535 (*)    pCO2, Ven 33.1 (*)    pO2, Ven 79.0 (*)    Acid-Base Excess 5.0 (*)    Sodium 149 (*)    Calcium, Ion 1.14 (*)    All other components within normal limits  SARS CORONAVIRUS 2 (HOSPITAL ORDER, [facility])  URINE CULTURE  AMMONIA  ETHANOL  LACTIC ACID, PLASMA  BASIC METABOLIC PANEL  CBC    EKG None  Radiology Dg Chest 2 View  Result Date: 07/15/2018 CLINICAL DATA:  Altered mental status.  Multiple falls. EXAM: CHEST - 2 VIEW COMPARISON:  None. FINDINGS: Multiple monitoring leads overlie the patient. Normal cardiac and mediastinal contours. No consolidative pulmonary opacities. No pleural effusion or pneumothorax. Thoracic spine degenerative changes. IMPRESSION: No acute cardiopulmonary process. Electronically Signed   By: Lovey Newcomer M.D.   On: 07/15/2018 12:38   Ct Head Wo Contrast  Result Date: 07/15/2018 CLINICAL DATA:  Altered level of consciousness. EXAM: CT HEAD WITHOUT CONTRAST TECHNIQUE: Contiguous axial images were obtained from the base of the skull through the vertex without intravenous contrast. COMPARISON:  None. FINDINGS: Brain: Mild chronic ischemic white matter disease is noted. No mass effect or midline shift is noted. Ventricular size is within normal limits. There is no evidence of mass lesion, hemorrhage or acute infarction. Vascular: No hyperdense vessel or unexpected calcification. Skull: Normal. Negative for fracture or focal lesion. Sinuses/Orbits: Left maxillary and sphenoid sinusitis is noted. Other: None. IMPRESSION: Mild chronic ischemic white matter disease. No acute intracranial abnormality seen. Electronically Signed   By: Marijo Conception M.D.   On: 07/15/2018 14:04    Procedures Procedures (including critical care time)  Medications Ordered in ED Medications  enoxaparin (LOVENOX) injection 30 mg (30 mg Subcutaneous Given by Other 07/15/18 1746)  sodium chloride flush (NS) 0.9 % injection 3 mL  (3 mLs Intravenous Given 07/15/18 1746)  0.9 % NaCl with KCl 20 mEq/ L  infusion ( Intravenous Stopped 07/15/18 1738)  acetaminophen (TYLENOL) tablet 650 mg (has no administration in time range)    Or  acetaminophen (TYLENOL) suppository 650 mg (has no administration in time range)  ibuprofen (ADVIL) tablet 400 mg (has no administration in time range)  polyethylene glycol (MIRALAX / GLYCOLAX) packet 17 g (has no administration in time range)  ondansetron (ZOFRAN) tablet 4 mg (has no administration in time range)    Or  ondansetron (ZOFRAN) injection 4 mg (has no administration in time range)  multivitamin with minerals tablet 1 tablet (1 tablet Oral Given by Other 07/15/18 1746)  atenolol (TENORMIN) tablet 25 mg (has no administration in time range)  fenofibrate tablet 160 mg (has no administration in time range)  levothyroxine (SYNTHROID) tablet 75 mcg (has no administration in time range)  polyvinyl alcohol (LIQUIFILM TEARS) 1.4 % ophthalmic solution 1-2 drop (has no administration in time range)  sodium chloride 0.9 % bolus 1,000 mL ( Intravenous Rate/Dose Verify 07/15/18 1745)     Initial Impression / Assessment and Plan / ED Course  I have reviewed the triage vital signs and the nursing notes.  Pertinent labs & imaging results that were available during my care of  the patient were reviewed by me and considered in my medical decision making (see chart for details).        85 yo F with a chief complaint of generalized weakness and frequent falls.  Going on for the past couple weeks.  Recently started on donepezil prior to the onset of the symptoms.  Timeline wise this seems like the most likely factor causing her symptoms.  As she is not eating and drinking we will check lab work give a bolus of IV fluids.  CT of the head for thought to have head injury yesterday.  Screen for infection with chest x-ray and UA.  Reassess.  Lab work is consistent with dehydration.  Patient has a elevated  leukocytosis has a very minimally elevated lactate.  Chest x-ray viewed by me without focal infiltrate.  UA is negative for infection.  CT the head is normal.  Her renal function has increased from baseline.  She has mild LFT elevation.  Hyponatremia which seems to be chronic.  Will discuss with medicine for possible admission.   The patients results and plan were reviewed and discussed.   Any x-rays performed were independently reviewed by myself.   Differential diagnosis were considered with the presenting HPI.  Medications  enoxaparin (LOVENOX) injection 30 mg (30 mg Subcutaneous Given by Other 07/15/18 1746)  sodium chloride flush (NS) 0.9 % injection 3 mL (3 mLs Intravenous Given 07/15/18 1746)  0.9 % NaCl with KCl 20 mEq/ L  infusion ( Intravenous Stopped 07/15/18 1738)  acetaminophen (TYLENOL) tablet 650 mg (has no administration in time range)    Or  acetaminophen (TYLENOL) suppository 650 mg (has no administration in time range)  ibuprofen (ADVIL) tablet 400 mg (has no administration in time range)  polyethylene glycol (MIRALAX / GLYCOLAX) packet 17 g (has no administration in time range)  ondansetron (ZOFRAN) tablet 4 mg (has no administration in time range)    Or  ondansetron (ZOFRAN) injection 4 mg (has no administration in time range)  multivitamin with minerals tablet 1 tablet (1 tablet Oral Given by Other 07/15/18 1746)  atenolol (TENORMIN) tablet 25 mg (has no administration in time range)  fenofibrate tablet 160 mg (has no administration in time range)  levothyroxine (SYNTHROID) tablet 75 mcg (has no administration in time range)  polyvinyl alcohol (LIQUIFILM TEARS) 1.4 % ophthalmic solution 1-2 drop (has no administration in time range)  sodium chloride 0.9 % bolus 1,000 mL ( Intravenous Rate/Dose Verify 07/15/18 1745)    Vitals:   07/15/18 1500 07/15/18 1515 07/15/18 1645 07/15/18 1822  BP: (!) 148/93 (!) 130/106 (!) 135/57 (!) 135/57  Pulse: (!) 56 (!) 54 (!) 53 (!) 53   Resp: (!) 21 11 12 12   Temp:   (!) 97.5 F (36.4 C) (!) 97.5 F (36.4 C)  TempSrc:   Oral Oral  SpO2: 94% 96% 99%   Weight:   66.4 kg 66.4 kg  Height:    5\' 2"  (1.575 m)    Final diagnoses:  AKI (acute kidney injury) ([place])  Dehydration    Admission/ observation were discussed with the admitting physician, patient and/or family and they are comfortable with the plan.   Final Clinical Impressions(s) / ED Diagnoses   Final diagnoses:  AKI (acute kidney injury) [facility])  Dehydration    ED Discharge Orders    None       Deno Etienne, DO 07/15/18 1920

## 2018-07-15 NOTE — ED Notes (Signed)
Attempted report 

## 2018-07-15 NOTE — Telephone Encounter (Signed)
I returned phone call from the patient's daughter to answering service.  She states that she is noticed increased falling and generalized decrease in patient's health for the last few days.  I advised her to take the patient to [facility] for evaluation in the ER to find out what was going on.  She voiced understanding.

## 2018-07-15 NOTE — ED Triage Notes (Signed)
Pt arrives from home. Per Daughter, pt has become less aware and unable to do much on her own which is new for her. Per family member pt has not been able to feed herself, walk very well and has been getting forgetful. Per family pt has fallen 3 times in the last 2 days. Pt was started on a new medication by neurology 3 weeks ago and family states that these symptoms seem to have started after the medication was started.

## 2018-07-15 NOTE — ED Notes (Signed)
ED TO INPATIENT HANDOFF REPORT  ED Nurse Name and Phone #: Hannie [phone]  S Name/Age/Gender [name] 85 y.o. female Room/Bed: 045C/045C  Code Status   Code Status: Full Code  Home/SNF/Other Home Patient oriented to: self, place and time Is this baseline? No   Triage Complete: Triage complete  Chief Complaint Multiple Falls; Memory Problems  Triage Note Daughter- Mrs. Justin Mend, son- Mr. Ottey [phone]- daughter [phone]- son  Pt arrives from home. Per Daughter, pt has become less aware and unable to do much on her own which is new for her. Per family member pt has not been able to feed herself, walk very well and has been getting forgetful. Per family pt has fallen 3 times in the last 2 days. Pt was started on a new medication by neurology 3 weeks ago and family states that these symptoms seem to have started after the medication was started.   Allergies Allergies  Allergen Reactions  . Bee Venom Shortness Of Breath, Itching, Rash and Other (See Comments)    Almost passed out, also  . Penicillins Rash    Has patient had a PCN reaction causing immediate rash, facial/tongue/throat swelling, SOB or lightheadedness with hypotension: Yes Has patient had a PCN reaction causing severe rash involving mucus membranes or skin necrosis: No Has patient had a PCN reaction that required hospitalization: Unknown Has patient had a PCN reaction occurring within the last 10 years: No If all of the above answers are "NO", then may proceed with Cephalosporin use.     Level of Care/Admitting Diagnosis ED Disposition    ED Disposition Condition [facility] Area: [place] [[id number]]  Level of Care: Telemetry Medical [104]  I expect the patient will be discharged within 24 hours: Yes  LOW acuity---Tx typically complete <24 hrs---ACUTE conditions typically can be evaluated <24 hours---LABS likely to return to acceptable levels <24 hours---IS near  functional baseline---EXPECTED to return to current living arrangement---NOT newly hypoxic: Meets criteria for 5C-Observation unit  Covid Evaluation: Person Under Investigation (PUI)  Diagnosis: Weakness [144315]  Admitting Physician: Lady Deutscher [[id number]]  Attending Physician: Lady Deutscher [phone]  PT Class (Do Not Modify): Observation [104]  PT Acc Code (Do Not Modify): Observation [10022]       B Medical/Surgery History Past Medical History:  Diagnosis Date  . Benign paroxysmal positional vertigo   . Diverticulosis   . Duodenal ulcer   . Endometrial cancer [facility])    age 44  . GERD (gastroesophageal reflux disease)   . Hypertension   . Hypothyroidism   . Memory loss   . Peripheral neuropathy   . 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 CATARACT EXTRACTION    . CHOLECYSTECTOMY    . FOOT SURGERY     Right  . KNEE SURGERY     Right  . VAGINAL HYSTERECTOMY       A IV Location/Drains/Wounds Patient Lines/Drains/Airways Status   Active Line/Drains/Airways    Name:   Placement date:   Placement time:   Site:   Days:   Peripheral IV 07/15/18 Left Hand   07/15/18    1453    Hand   less than 1          Intake/Output Last 24 hours No intake or output data in the 24 hours ending 07/15/18 1544  Labs/Imaging Results for orders placed or performed during the hospital encounter of  07/15/18 (from the past 48 hour(s))  POCT I-Stat EG7     Status: Abnormal   Collection Time: 07/15/18 12:12 PM  Result Value Ref Range   pH, Ven 7.535 (H) 7.250 - 7.430   pCO2, Ven 33.1 (L) 44.0 - 60.0 mmHg   pO2, Ven 79.0 (H) 32.0 - 45.0 mmHg   Bicarbonate 28.0 20.0 - 28.0 mmol/L   TCO2 29 22 - 32 mmol/L   O2 Saturation 97.0 %   Acid-Base Excess 5.0 (H) 0.0 - 2.0 mmol/L   Sodium 149 (H) 135 - 145 mmol/L   Potassium 4.6 3.5 - 5.1 mmol/L   Calcium, Ion 1.14 (L) 1.15 - 1.40 mmol/L   HCT 38.0 36.0 - 46.0 %   Hemoglobin  12.9 12.0 - 15.0 g/dL   Patient temperature HIDE    Sample type VENOUS   CBG monitoring, ED     Status: Abnormal   Collection Time: 07/15/18 12:13 PM  Result Value Ref Range   Glucose-Capillary 136 (H) 70 - 99 mg/dL  Ammonia     Status: None   Collection Time: 07/15/18 12:15 PM  Result Value Ref Range   Ammonia 29 9 - 35 umol/L    Comment: Performed at [facility], [address] [place], [place] [address]  Comprehensive metabolic panel     Status: Abnormal   Collection Time: 07/15/18 12:15 PM  Result Value Ref Range   Sodium 146 (H) 135 - 145 mmol/L   Potassium 4.5 3.5 - 5.1 mmol/L    Comment: SLIGHT HEMOLYSIS   Chloride 111 98 - 111 mmol/L   CO2 22 22 - 32 mmol/L   Glucose, Bld 161 (H) 70 - 99 mg/dL   BUN 51 (H) 8 - 23 mg/dL   Creatinine, Ser 1.86 (H) 0.44 - 1.00 mg/dL   Calcium 9.2 8.9 - 10.3 mg/dL   Total Protein 6.7 6.5 - 8.1 g/dL   Albumin 3.2 (L) 3.5 - 5.0 g/dL   AST 70 (H) 15 - 41 U/L   ALT 19 0 - 44 U/L   Alkaline Phosphatase 27 (L) 38 - 126 U/L   Total Bilirubin 1.4 (H) 0.3 - 1.2 mg/dL   GFR calc non Af Amer 24 (L) >60 mL/min   GFR calc Af Amer 28 (L) >60 mL/min   Anion gap 13 5 - 15    Comment: Performed at [facility], [place] [address] [place], [place] [address]  Ethanol     Status: None   Collection Time: 07/15/18 12:15 PM  Result Value Ref Range   Alcohol, Ethyl (B) <10 <10 mg/dL    Comment: (NOTE) Lowest detectable limit for serum alcohol is 10 mg/dL. For medical purposes only. Performed at [facility], [place] [address], [place], [place] [address]   CBC WITH DIFFERENTIAL     Status: Abnormal   Collection Time: 07/15/18 12:15 PM  Result Value Ref Range   WBC 15.2 (H) 4.0 - 10.5 K/uL   RBC 3.88 3.87 - 5.11 MIL/uL   Hemoglobin 12.3 12.0 - 15.0 g/dL   HCT 38.1 36.0 - 46.0 %   MCV 98.2 80.0 - 100.0 fL   MCH 31.7 26.0 - 34.0 pg   MCHC 32.3 30.0 - 36.0 g/dL   RDW 14.7 11.5 - 15.5 %   Platelets 254 150 - 400 K/uL   nRBC 0.0 0.0 - 0.2 %    Neutrophils Relative % 77 %   Neutro Abs 11.9 (H) 1.7 - 7.7  K/uL   Lymphocytes Relative 16 %   Lymphs Abs 2.4 0.7 - 4.0 K/uL   Monocytes Relative 6 %   Monocytes Absolute 0.9 0.1 - 1.0 K/uL   Eosinophils Relative 0 %   Eosinophils Absolute 0.0 0.0 - 0.5 K/uL   Basophils Relative 0 %   Basophils Absolute 0.1 0.0 - 0.1 K/uL   Immature Granulocytes 1 %   Abs Immature Granulocytes 0.08 (H) 0.00 - 0.07 K/uL    Comment: Performed at [facility] [address], [place], [place] [address]  TSH     Status: Abnormal   Collection Time: 07/15/18 12:15 PM  Result Value Ref Range   TSH 0.162 (L) 0.350 - 4.500 uIU/mL    Comment: Performed by a 3rd Generation assay with a functional sensitivity of <=0.01 uIU/mL. Performed at [facility], [place] [address], [place], [place] [address]   Lactic acid, plasma     Status: Abnormal   Collection Time: 07/15/18 12:15 PM  Result Value Ref Range   Lactic Acid, Venous 2.2 (HH) 0.5 - 1.9 mmol/L    Comment: CRITICAL RESULT CALLED TO, READ BACK BY AND VERIFIED WITH: H.Caleen Taaffe,RN 1250 07/15/2018 [name],S Performed at [facility] [address], [place], [place] [address]   Urinalysis, Routine w reflex microscopic (not at [facility])     Status: Abnormal   Collection Time: 07/15/18  1:55 PM  Result Value Ref Range   Color, Urine YELLOW YELLOW   APPearance HAZY (A) CLEAR   Specific Gravity, Urine 1.028 1.005 - 1.030   pH 5.0 5.0 - 8.0   Glucose, UA NEGATIVE NEGATIVE mg/dL   Hgb urine dipstick SMALL (A) NEGATIVE   Bilirubin Urine NEGATIVE NEGATIVE   Ketones, ur NEGATIVE NEGATIVE mg/dL   Protein, ur 30 (A) NEGATIVE mg/dL   Nitrite NEGATIVE NEGATIVE   Leukocytes,Ua NEGATIVE NEGATIVE   RBC / HPF 0-5 0 - 5 RBC/hpf   WBC, UA 0-5 0 - 5 WBC/hpf   Bacteria, UA NONE SEEN NONE SEEN   Squamous Epithelial / LPF 0-5 0 - 5   Mucus PRESENT     Comment: Performed at [facility], [place] [address], [place],  [address]   Dg Chest 2  View  Result Date: 07/15/2018 CLINICAL DATA:  Altered mental status.  Multiple falls. EXAM: CHEST - 2 VIEW COMPARISON:  None. FINDINGS: Multiple monitoring leads overlie the patient. Normal cardiac and mediastinal contours. No consolidative pulmonary opacities. No pleural effusion or pneumothorax. Thoracic spine degenerative changes. IMPRESSION: No acute cardiopulmonary process. Electronically Signed   By: Lovey Newcomer M.D.   On: 07/15/2018 12:38   Ct Head Wo Contrast  Result Date: 07/15/2018 CLINICAL DATA:  Altered level of consciousness. EXAM: CT HEAD WITHOUT CONTRAST TECHNIQUE: Contiguous axial images were obtained from the base of the skull through the vertex without intravenous contrast. COMPARISON:  None. FINDINGS: Brain: Mild chronic ischemic white matter disease is noted. No mass effect or midline shift is noted. Ventricular size is within normal limits. There is no evidence of mass lesion, hemorrhage or acute infarction. Vascular: No hyperdense vessel or unexpected calcification. Skull: Normal. Negative for fracture or focal lesion. Sinuses/Orbits: Left maxillary and sphenoid sinusitis is noted. Other: None. IMPRESSION: Mild chronic ischemic white matter disease. No acute intracranial abnormality seen. Electronically Signed   By: Marijo Conception M.D.   On: 07/15/2018 14:04    Pending Labs Unresulted Labs (From admission, onward)    Start  Ordered   07/22/18 0500  Creatinine, serum  (enoxaparin (LOVENOX)    CrCl < 30 ml/min)  Weekly,   R    Comments: while on enoxaparin therapy.    07/15/18 1523   07/16/18 3419  Basic metabolic panel  Tomorrow morning,   R     07/15/18 1523   07/16/18 0500  CBC  Tomorrow morning,   R     07/15/18 1523   07/15/18 1520  CBC  (enoxaparin (LOVENOX)    CrCl < 30 ml/min)  Once,   STAT    Comments: Baseline for enoxaparin therapy IF NOT ALREADY DRAWN.  Notify MD if PLT < 100 K.    07/15/18 1523   07/15/18 1520  Creatinine, serum  (enoxaparin (LOVENOX)     CrCl < 30 ml/min)  Once,   STAT    Comments: Baseline for enoxaparin therapy IF NOT ALREADY DRAWN.    07/15/18 1523   07/15/18 1439  SARS Coronavirus 2 (CEPHEID - Performed in [place] hospital lab), Hosp Order  (Asymptomatic Patients Labs)  Once,   STAT    Question:  Rule Out  Answer:  Yes   07/15/18 1438   07/15/18 1202  Lactic acid, plasma  Now then every 2 hours,   STAT     07/15/18 1201   07/15/18 1200  Urine culture  ONCE - STAT,   STAT     07/15/18 1200          Vitals/Pain Today's Vitals   07/15/18 1358 07/15/18 1400 07/15/18 1457 07/15/18 1500  BP:  (!) 131/52  (!) 148/93  Pulse:  (!) 57  (!) 56  Resp:  17  (!) 21  Temp:      TempSrc:      SpO2:  96%  94%  PainSc: 0-No pain  0-No pain     Isolation Precautions No active isolations  Medications Medications  enoxaparin (LOVENOX) injection 30 mg (has no administration in time range)  sodium chloride flush (NS) 0.9 % injection 3 mL (has no administration in time range)  0.9 % NaCl with KCl 20 mEq/ L  infusion (has no administration in time range)  acetaminophen (TYLENOL) tablet 650 mg (has no administration in time range)    Or  acetaminophen (TYLENOL) suppository 650 mg (has no administration in time range)  ibuprofen (ADVIL) tablet 400 mg (has no administration in time range)  polyethylene glycol (MIRALAX / GLYCOLAX) packet 17 g (has no administration in time range)  ondansetron (ZOFRAN) tablet 4 mg (has no administration in time range)    Or  ondansetron (ZOFRAN) injection 4 mg (has no administration in time range)  multivitamin with minerals tablet 1 tablet (has no administration in time range)    Mobility walks with person assist Low fall risk   Focused Assessments Neuro Assessment Handoff:  Swallow screen pass? Cardiac Rhythm: Sinus bradycardia       Neuro Assessment: Exceptions to WDL Neuro Checks:      Last Documented NIHSS Modified Score:   Has TPA been given? No If patient is a Neuro  Trauma and patient is going to OR before floor call report to [place] nurse: [phone] or [phone]     R Recommendations: See Admitting Provider Note  Report given to:   Additional Notes:

## 2018-07-15 NOTE — ED Notes (Signed)
Patient transported to CT 

## 2018-07-15 NOTE — ED Triage Notes (Signed)
Daughter- Mrs. Justin Mend, son- Mr. Nissan [phone]- daughter [phone]- son

## 2018-07-16 ENCOUNTER — Observation Stay (HOSPITAL_COMMUNITY): Payer: Medicare Other

## 2018-07-16 DIAGNOSIS — H8110 Benign paroxysmal vertigo, unspecified ear: Secondary | ICD-10-CM | POA: Diagnosis present

## 2018-07-16 DIAGNOSIS — Z7952 Long term (current) use of systemic steroids: Secondary | ICD-10-CM | POA: Diagnosis not present

## 2018-07-16 DIAGNOSIS — R269 Unspecified abnormalities of gait and mobility: Secondary | ICD-10-CM | POA: Diagnosis present

## 2018-07-16 DIAGNOSIS — I10 Essential (primary) hypertension: Secondary | ICD-10-CM | POA: Diagnosis present

## 2018-07-16 DIAGNOSIS — Z7951 Long term (current) use of inhaled steroids: Secondary | ICD-10-CM | POA: Diagnosis not present

## 2018-07-16 DIAGNOSIS — Z1159 Encounter for screening for other viral diseases: Secondary | ICD-10-CM | POA: Diagnosis not present

## 2018-07-16 DIAGNOSIS — R531 Weakness: Secondary | ICD-10-CM | POA: Diagnosis present

## 2018-07-16 DIAGNOSIS — G9340 Encephalopathy, unspecified: Secondary | ICD-10-CM | POA: Diagnosis present

## 2018-07-16 DIAGNOSIS — E039 Hypothyroidism, unspecified: Secondary | ICD-10-CM | POA: Diagnosis present

## 2018-07-16 DIAGNOSIS — E0590 Thyrotoxicosis, unspecified without thyrotoxic crisis or storm: Secondary | ICD-10-CM | POA: Diagnosis present

## 2018-07-16 DIAGNOSIS — E870 Hyperosmolality and hypernatremia: Secondary | ICD-10-CM | POA: Diagnosis present

## 2018-07-16 DIAGNOSIS — K5790 Diverticulosis of intestine, part unspecified, without perforation or abscess without bleeding: Secondary | ICD-10-CM | POA: Diagnosis present

## 2018-07-16 DIAGNOSIS — R296 Repeated falls: Secondary | ICD-10-CM | POA: Diagnosis present

## 2018-07-16 DIAGNOSIS — N179 Acute kidney failure, unspecified: Secondary | ICD-10-CM | POA: Diagnosis present

## 2018-07-16 DIAGNOSIS — E860 Dehydration: Secondary | ICD-10-CM | POA: Diagnosis present

## 2018-07-16 DIAGNOSIS — Z8249 Family history of ischemic heart disease and other diseases of the circulatory system: Secondary | ICD-10-CM | POA: Diagnosis not present

## 2018-07-16 DIAGNOSIS — Z79890 Hormone replacement therapy (postmenopausal): Secondary | ICD-10-CM | POA: Diagnosis not present

## 2018-07-16 DIAGNOSIS — K219 Gastro-esophageal reflux disease without esophagitis: Secondary | ICD-10-CM | POA: Diagnosis present

## 2018-07-16 DIAGNOSIS — J189 Pneumonia, unspecified organism: Secondary | ICD-10-CM | POA: Diagnosis present

## 2018-07-16 DIAGNOSIS — F0390 Unspecified dementia without behavioral disturbance: Secondary | ICD-10-CM | POA: Diagnosis present

## 2018-07-16 LAB — BASIC METABOLIC PANEL
Anion gap: 8 (ref 5–15)
BUN: 43 mg/dL — ABNORMAL HIGH (ref 8–23)
CO2: 24 mmol/L (ref 22–32)
Calcium: 8.5 mg/dL — ABNORMAL LOW (ref 8.9–10.3)
Chloride: 115 mmol/L — ABNORMAL HIGH (ref 98–111)
Creatinine, Ser: 1.39 mg/dL — ABNORMAL HIGH (ref 0.44–1.00)
GFR calc Af Amer: 40 mL/min — ABNORMAL LOW (ref >60)
GFR calc non Af Amer: 35 mL/min — ABNORMAL LOW (ref >60)
Glucose, Bld: 144 mg/dL — ABNORMAL HIGH (ref 70–99)
Potassium: 3.9 mmol/L (ref 3.5–5.1)
Sodium: 147 mmol/L — ABNORMAL HIGH (ref 135–145)

## 2018-07-16 LAB — CBC
HCT: 34.4 % — ABNORMAL LOW (ref 36.0–46.0)
Hemoglobin: 10.9 g/dL — ABNORMAL LOW (ref 12.0–15.0)
MCH: 31.1 pg (ref 26.0–34.0)
MCHC: 31.7 g/dL (ref 30.0–36.0)
MCV: 98.3 fL (ref 80.0–100.0)
Platelets: 244 10*3/uL (ref 150–400)
RBC: 3.5 MIL/uL — ABNORMAL LOW (ref 3.87–5.11)
RDW: 14.6 % (ref 11.5–15.5)
WBC: 11.3 10*3/uL — ABNORMAL HIGH (ref 4.0–10.5)
nRBC: 0 % (ref 0.0–0.2)

## 2018-07-16 LAB — URINE CULTURE: Culture: NO GROWTH

## 2018-07-16 NOTE — Evaluation (Signed)
Occupational Therapy Evaluation Patient Details Name: SUSANA DUELL MRN: [id number] DOB: June 25, 1931 Today's Date: 07/16/2018    History of Present Illness [place] H Nemitz is a 85 y.o. female s/p acut ekidney injury and dehydration. Pt PMHx: benign positional vertigo, diverticulosis, GERD, hypertension, hypothyroidism, and peripheral neuropathy who presents to the emergency department with a chief complaint of generalized weakness increased fatigue and frequent falls. MRI (-)   Clinical Impression   Pt PTA: Daughter lives with her. Pt currently limited by mild cognitive deficits affecting safety with ADL. Pt reports independence and admits that "some things I just can't remember like by birth year." Pt currently performing ADL with supervision level in standing. Pt supervision A with occasional reminders to maneuver RW through tight spaces. Pt appears to have good safety awareness. Pt reports no dizziness with positional change. Pt requires continued OT for strategies for higher level cognitive strategies. OT following acutely.      Follow Up Recommendations  Home health OT;Supervision/Assistance - 24 hour    Equipment Recommendations  Other (comment)(RW (compare on PT thoughts))    Recommendations for Other Services       Precautions / Restrictions Precautions Precautions: Fall Restrictions Weight Bearing Restrictions: No      Mobility Bed Mobility Overal bed mobility: Needs Assistance Bed Mobility: Sidelying to Sit   Sidelying to sit: Supervision          Transfers Overall transfer level: Needs assistance Equipment used: Rolling walker (2 wheeled) Transfers: Sit to/from Stand Sit to Stand: Supervision         General transfer comment: verbal cues for reaching back before sitting down    Balance Overall balance assessment: Mild deficits observed, not formally tested                                         ADL either performed or assessed  with clinical judgement   ADL Overall ADL's : At baseline                                       General ADL Comments: Pt supervision level for toilet hygiene, standing at sink for ADL and LB dressing with figure 4 technique     Vision Baseline Vision/History: Wears glasses Wears Glasses: At all times Vision Assessment?: No apparent visual deficits     Perception     Praxis      Pertinent Vitals/Pain Pain Assessment: No/denies pain     Hand Dominance Right   Extremity/Trunk Assessment Upper Extremity Assessment Upper Extremity Assessment: Overall WFL for tasks assessed   Lower Extremity Assessment Lower Extremity Assessment: Overall WFL for tasks assessed   Cervical / Trunk Assessment Cervical / Trunk Assessment: Normal   Communication Communication Communication: No difficulties   Cognition Arousal/Alertness: Awake/alert Behavior During Therapy: WFL for tasks assessed/performed Overall Cognitive Status: History of cognitive impairments - at baseline                                     General Comments  VSS.  Usually urinary incontinent requiring depends at home.pt changed out of soiled depends and added purewick,.    Exercises     Shoulder Instructions      Home Living Family/patient expects  to be discharged to:: Private residence Living Arrangements: Children Available Help at Discharge: Family;Available 24 hours/day Type of Home: House Home Access: Stairs to enter [employer] of Steps: 3 Entrance Stairs-Rails: None Home Layout: One level     Bathroom Shower/Tub: Teacher, early years/pre: Standard     Home Equipment: None          Prior Functioning/Environment Level of Independence: Independent        Comments: Pt reports mostly independent.        OT Problem List: Decreased activity tolerance;Decreased safety awareness      OT Treatment/Interventions: Self-care/ADL  training;Therapeutic exercise;Neuromuscular education;Energy conservation;Patient/family education;Balance training    OT Goals(Current goals can be found in the care plan section) Acute Rehab OT Goals Patient Stated Goal: to go home. OT Goal Formulation: With patient Time For Goal Achievement: 07/30/18 Potential to Achieve Goals: Good ADL Goals Pt Will Perform Lower Body Dressing: with modified independence;sit to/from stand Pt Will Perform Toileting - Clothing Manipulation and hygiene: with modified independence;sit to/from stand Additional ADL Goal #1: Pt will be modified independence with OOB ADL usign least restrictive AD.  OT Frequency: Min 2X/week   Barriers to D/C:            Co-evaluation              AM-PAC OT "6 Clicks" Daily Activity     Outcome Measure Help from another person eating meals?: None Help from another person taking care of personal grooming?: None Help from another person toileting, which includes using toliet, bedpan, or urinal?: A Little Help from another person bathing (including washing, rinsing, drying)?: A Little Help from another person to put on and taking off regular upper body clothing?: None Help from another person to put on and taking off regular lower body clothing?: None 6 Click Score: 22   End of Session Equipment Utilized During Treatment: Gait belt;Rolling walker  Activity Tolerance: Patient tolerated treatment well Patient left: in chair;with call bell/phone within reach;with chair alarm set  OT Visit Diagnosis: Unsteadiness on feet (R26.81);Muscle weakness (generalized) (M62.81)                Time: 3005-1102 OT Time Calculation (min): 30 min Charges:  OT General Charges $OT Visit: 1 Visit OT Evaluation $OT Eval Moderate Complexity: 1 Mod OT Treatments $Self Care/Home Management : 8-22 mins  Ebony Hail Harold Hedge) Marsa Aris OTR/L Acute Rehabilitation Services Pager: [phone] Office: [place] 07/16/2018, 9:17 AM

## 2018-07-16 NOTE — Progress Notes (Signed)
Progress Note    Kristina [name]  TDV:[id number] DOB: 1931-01-29  DOA: 07/15/2018 PCP: Midge Minium, MD    Brief Narrative:     Medical records reviewed and are as summarized below:  Kristina [name] is an 85 y.o. female with medical history significant of benign positional vertigo, diverticulosis, GERD, hypertension, hypothyroidism, and peripheral neuropathy who presents to the emergency department with a chief complaint of generalized weakness increased fatigue and frequent falls.   Assessment/Plan:   Principal Problem:   AKI (acute kidney injury) ([place]) Active Problems:   Mild cognitive impairment   Hypothyroidism   Abnormality of gait   Weakness   Dehydration   Confusion   Encephalopathy acute   Hyperthyroidism   Leukocytosis  1.  Dehydration with acute kidney injury:  -continue IVF as patient still appears dehydrated -AM labs -hypernatremia-- encourage PO water intake  2.  Acute encephalopathy Associated with weakness and confusion:  -MRI negative for CVA -symptoms began after aricept started  3.  Hyperthyroidism related to repletion of right hormone with levothyroxine patient who is hypothyroid: We will hold Synthroid and restart at discharge at a lower dose of 50 mcg p.o. daily.  4.  Leukocytosis: Etiology is unclear  -trending down  5.  Cognitive impairment: Noted dehydration may have made it much worse. -stop aricept-- new medication  6.  Abnormality of gait: PT/OT- MRI suggestive of Stable moderate volume loss of the brain including prominent volume loss in the anteromedial temporal lobe which can be seen with neurodegenerative disorders.    Family Communication/Anticipated D/C date and plan/Code Status   DVT prophylaxis: Lovenox ordered. Code Status: Full Code.  Family Communication:  Disposition Plan: needs continued IVF   Medical Consultants:    Neuro called from ER  Subjective:   Denies pain Ate breakfast  Objective:     Vitals:   07/15/18 2013 07/15/18 2251 07/16/18 0508 07/16/18 0822  BP: (!) 115/55 (!) 139/54 (!) 142/49 (!) 151/61  Pulse: (!) 53 (!) 54 61 63  Resp: 12 18 18 18   Temp: 98.1 F (36.7 C) 98 F (36.7 C) 98.3 F (36.8 C) 98 F (36.7 C)  TempSrc: Axillary Oral Oral Oral  SpO2: 94% 96% 95% 97%  Weight:      Height:        Intake/Output Summary (Last 24 hours) at 07/16/2018 0932 Last data filed at 07/16/2018 0400 Gross per 24 hour  Intake 812.81 ml  Output --  Net 812.81 ml   Filed Weights   07/15/18 1645 07/15/18 1822  Weight: 66.4 kg 66.4 kg    Exam: In bed, pleasant Oriented to place and person but not time rrr No wheezing Dry mucous membranes  Data Reviewed:   I have personally reviewed following labs and imaging studies:  Labs: Labs show the following:   Basic Metabolic Panel: Recent Labs  Lab 07/15/18 1212 07/15/18 1215 07/15/18 1642 07/16/18 0737  NA 149* 146*  --  147*  K 4.6 4.5  --  3.9  CL  --  111  --  115*  CO2  --  22  --  24  GLUCOSE  --  161*  --  144*  BUN  --  51*  --  43*  CREATININE  --  1.86* 1.68* 1.39*  CALCIUM  --  9.2  --  8.5*   GFR Estimated Creatinine Clearance: 26.9 mL/min (A) (by C-G formula based on SCr of 1.39 mg/dL (H)). Liver Function Tests: Recent Labs  Lab 07/15/18 1215  AST 70*  ALT 19  ALKPHOS 27*  BILITOT 1.4*  PROT 6.7  ALBUMIN 3.2*   No results for input(s): LIPASE, AMYLASE in the last 168 hours. Recent Labs  Lab 07/15/18 1215  AMMONIA 29   Coagulation profile No results for input(s): INR, PROTIME in the last 168 hours.  CBC: Recent Labs  Lab 07/15/18 1212 07/15/18 1215 07/15/18 1642 07/16/18 0737  WBC  --  15.2* 14.8* 11.3*  NEUTROABS  --  11.9*  --   --   HGB 12.9 12.3 12.2 10.9*  HCT 38.0 38.1 37.9 34.4*  MCV  --  98.2 98.2 98.3  PLT  --  254 267 244   Cardiac Enzymes: No results for input(s): CKTOTAL, CKMB, CKMBINDEX, TROPONINI in the last 168 hours. BNP (last 3 results) No  results for input(s): PROBNP in the last 8760 hours. CBG: Recent Labs  Lab 07/15/18 1213  GLUCAP 136*   D-Dimer: No results for input(s): DDIMER in the last 72 hours. Hgb A1c: No results for input(s): HGBA1C in the last 72 hours. Lipid Profile: No results for input(s): CHOL, HDL, LDLCALC, TRIG, CHOLHDL, LDLDIRECT in the last 72 hours. Thyroid function studies: Recent Labs    07/15/18 1215  TSH 0.162*   Anemia work up: No results for input(s): VITAMINB12, FOLATE, FERRITIN, TIBC, IRON, RETICCTPCT in the last 72 hours. Sepsis Labs: Recent Labs  Lab 07/15/18 1215 07/15/18 1445 07/15/18 1642 07/16/18 0737  WBC 15.2*  --  14.8* 11.3*  LATICACIDVEN 2.2* 1.5  --   --     Microbiology Recent Results (from the past 240 hour(s))  SARS Coronavirus 2 (CEPHEID - Performed in [place] hospital lab), Hosp Order     Status: None   Collection Time: 07/15/18  2:39 PM   Specimen: Nasopharyngeal Swab  Result Value Ref Range Status   SARS Coronavirus 2 NEGATIVE NEGATIVE Final    Comment: (NOTE) If result is NEGATIVE SARS-CoV-2 target nucleic acids are NOT DETECTED. The SARS-CoV-2 RNA is generally detectable in upper and lower  respiratory specimens during the acute phase of infection. The lowest  concentration of SARS-CoV-2 viral copies this assay can detect is 250  copies / mL. A negative result does not preclude SARS-CoV-2 infection  and should not be used as the sole basis for treatment or other  patient management decisions.  A negative result may occur with  improper specimen collection / handling, submission of specimen other  than nasopharyngeal swab, presence of viral mutation(s) within the  areas targeted by this assay, and inadequate number of viral copies  (<250 copies / mL). A negative result must be combined with clinical  observations, patient history, and epidemiological information. If result is POSITIVE SARS-CoV-2 target nucleic acids are DETECTED. The SARS-CoV-2  RNA is generally detectable in upper and lower  respiratory specimens dur ing the acute phase of infection.  Positive  results are indicative of active infection with SARS-CoV-2.  Clinical  correlation with patient history and other diagnostic information is  necessary to determine patient infection status.  Positive results do  not rule out bacterial infection or co-infection with other viruses. If result is PRESUMPTIVE POSTIVE SARS-CoV-2 nucleic acids MAY BE PRESENT.   A presumptive positive result was obtained on the submitted specimen  and confirmed on repeat testing.  While 2019 novel coronavirus  (SARS-CoV-2) nucleic acids may be present in the submitted sample  additional confirmatory testing may be necessary for epidemiological  and / or clinical  management purposes  to differentiate between  SARS-CoV-2 and other Sarbecovirus currently known to infect humans.  If clinically indicated additional testing with an alternate test  methodology [phone]) is advised. The SARS-CoV-2 RNA is generally  detectable in upper and lower respiratory sp ecimens during the acute  phase of infection. The expected result is Negative. Fact Sheet for Patients:  StrictlyIdeas.no Fact Sheet for Healthcare Providers: BankingDealers.co.za This test is not yet approved or cleared by the [place] FDA and has been authorized for detection and/or diagnosis of SARS-CoV-2 by FDA under an Emergency Use Authorization (EUA).  This EUA will remain in effect (meaning this test can be used) for the duration of the COVID-19 declaration under Section 564(b)(1) of the Act, 21 U.S.C. section 360bbb-3(b)(1), unless the authorization is terminated or revoked sooner. Performed at [facility], [place] [address] [place], [place] [address]     Procedures and diagnostic studies:  Dg Chest 2 View  Result Date: 07/15/2018 CLINICAL DATA:  Altered mental status.   Multiple falls. EXAM: CHEST - 2 VIEW COMPARISON:  None. FINDINGS: Multiple monitoring leads overlie the patient. Normal cardiac and mediastinal contours. No consolidative pulmonary opacities. No pleural effusion or pneumothorax. Thoracic spine degenerative changes. IMPRESSION: No acute cardiopulmonary process. Electronically Signed   By: Lovey Newcomer M.D.   On: 07/15/2018 12:38   Ct Head Wo Contrast  Result Date: 07/15/2018 CLINICAL DATA:  Altered level of consciousness. EXAM: CT HEAD WITHOUT CONTRAST TECHNIQUE: Contiguous axial images were obtained from the base of the skull through the vertex without intravenous contrast. COMPARISON:  None. FINDINGS: Brain: Mild chronic ischemic white matter disease is noted. No mass effect or midline shift is noted. Ventricular size is within normal limits. There is no evidence of mass lesion, hemorrhage or acute infarction. Vascular: No hyperdense vessel or unexpected calcification. Skull: Normal. Negative for fracture or focal lesion. Sinuses/Orbits: Left maxillary and sphenoid sinusitis is noted. Other: None. IMPRESSION: Mild chronic ischemic white matter disease. No acute intracranial abnormality seen. Electronically Signed   By: Marijo Conception M.D.   On: 07/15/2018 14:04   Mr Brain Wo Contrast  Result Date: 07/16/2018 CLINICAL DATA:  85 y/o F; generalized weakness, fatigue, frequent falls. EXAM: MRI HEAD WITHOUT CONTRAST TECHNIQUE: Multiplanar, multiecho pulse sequences of the brain and surrounding structures were obtained without intravenous contrast. COMPARISON:  07/15/2018 CT head.  10/18/2015 MRI head. FINDINGS: Brain: No acute infarction, hemorrhage, hydrocephalus, extra-axial collection or mass lesion. Moderate volume loss of the brain including prominent volume loss in the anteromedial temporal lobes. Mild chronic microvascular ischemic changes of periventricular white matter. Vascular: Normal flow voids. Skull and upper cervical spine: Normal marrow signal.  Sinuses/Orbits: Left sphenoid sinus mucous and left maxillary sinus mucous retention cysts and mucosal thickening. Partial opacification of right mastoid air cells. No abnormal signal of additional included paranasal sinuses or the left mastoid air cells. Other: None. IMPRESSION: 1. No acute intracranial abnormality identified. 2. Stable mild chronic microvascular ischemic changes. Stable moderate volume loss of the brain including prominent volume loss in the anteromedial temporal lobe which can be seen with neurodegenerative disorders. 3. Left maxillary and sphenoid sinus disease. Right mastoid air cell partial opacification. Electronically Signed   By: Kristine Garbe M.D.   On: 07/16/2018 01:27    Medications:    atenolol  25 mg Oral Daily   enoxaparin (LOVENOX) injection  30 mg Subcutaneous Q24H   fenofibrate  160 mg Oral Daily   levothyroxine  75 mcg Oral  QAC breakfast   multivitamin with minerals  1 tablet Oral Daily   polyvinyl alcohol  1-2 drop Both Eyes QHS   sodium chloride flush  3 mL Intravenous Q12H   Continuous Infusions:  0.9 % NaCl with KCl 20 mEq / L 100 mL/hr at 07/15/18 2005     LOS: 0 days   Geradine Girt  [facility]   How to contact the [facility] Attending or Consulting provider [place] or covering provider during after hours [place], for this patient?  1. Check the care team in [facility] and look for a) attending/consulting TRH provider listed and b) the [facility] team listed 2. Log into [email or web address] and use [place]'s universal password to access. If you do not have the password, please contact the hospital operator. 3. Locate the [facility] provider you are looking for under [facility] and page to a number that you can be directly reached. 4. If you still have difficulty reaching the provider, please page the [facility] (Director on Call) for the Hospitalists listed on amion for assistance.  07/16/2018, 9:32 AM

## 2018-07-16 NOTE — Evaluation (Signed)
Physical Therapy Evaluation Patient Details Name: Kristina [name] MRN: [id number] DOB: 06-Jun-1931 Today's Date: 07/16/2018   History of Present Illness  Kristina [name] is a 85 y.o. female s/p acut ekidney injury and dehydration. Pt PMHx: benign positional vertigo, diverticulosis, GERD, hypertension, hypothyroidism, and peripheral neuropathy who presents to the emergency department with a chief complaint of generalized weakness increased fatigue and frequent falls. MRI (-)  Clinical Impression  Pt admitted with above diagnosis and presents to PT with functional limitations due to deficits listed below (See PT problem list). Pt needs skilled PT to maximize independence and safety to allow discharge to home with family and HHPT.      Follow Up Recommendations Home health PT    Equipment Recommendations  Other (comment)(rollator)    Recommendations for Other Services       Precautions / Restrictions Precautions Precautions: Fall Restrictions Weight Bearing Restrictions: No      Mobility  Bed Mobility               General bed mobility comments: Pt up in chair  Transfers Overall transfer level: Needs assistance Equipment used: Rolling walker (2 wheeled);4-wheeled walker;None Transfers: Sit to/from Stand Sit to Stand: Supervision         General transfer comment: Verbal cues for hand placement  Ambulation/Gait Ambulation/Gait assistance: Supervision Gait Distance (Feet): 200 Feet Assistive device: Rolling walker (2 wheeled);4-wheeled walker;None Gait Pattern/deviations: Step-through pattern;Decreased stride length Gait velocity: decr Gait velocity interpretation: 1.31 - 2.62 ft/sec, indicative of limited community ambulator General Gait Details: Assist for safety. Pt with incr stability using rolling walker or rollator. Pt preferred rollator  Stairs            Wheelchair Mobility    Modified Rankin (Stroke Patients Only)       Balance Overall  balance assessment: Mild deficits observed, not formally tested                                           Pertinent Vitals/Pain Pain Assessment: No/denies pain    Home Living Family/patient expects to be discharged to:: Private residence Living Arrangements: Children Available Help at Discharge: Family;Available 24 hours/day Type of Home: House Home Access: Stairs to enter Entrance Stairs-Rails: None Entrance Stairs-Number of Steps: 3 Home Layout: One level Home Equipment: None      Prior Function Level of Independence: Independent         Comments: Pt reports mostly independent.     Hand Dominance   Dominant Hand: Right    Extremity/Trunk Assessment   Upper Extremity Assessment Upper Extremity Assessment: Defer to OT evaluation    Lower Extremity Assessment Lower Extremity Assessment: Generalized weakness    Cervical / Trunk Assessment Cervical / Trunk Assessment: Normal  Communication   Communication: No difficulties  Cognition Arousal/Alertness: Awake/alert Behavior During Therapy: WFL for tasks assessed/performed Overall Cognitive Status: History of cognitive impairments - at baseline                                        General Comments      Exercises     Assessment/Plan    PT Assessment Patient needs continued PT services  PT Problem List Decreased strength;Decreased balance;Decreased mobility;Decreased knowledge of use of DME       PT  Treatment Interventions DME instruction;Stair training;Gait training;Functional mobility training;Therapeutic activities;Therapeutic exercise;Balance training;Patient/family education    PT Goals (Current goals can be found in the Care Plan section)  Acute Rehab PT Goals Patient Stated Goal: to go home. PT Goal Formulation: With patient Time For Goal Achievement: 07/30/18 Potential to Achieve Goals: Good    Frequency Min 3X/week   Barriers to discharge Inaccessible  home environment stairs to enter    Co-evaluation               AM-PAC PT "6 Clicks" Mobility  Outcome Measure Help needed turning from your back to your side while in a flat bed without using bedrails?: None Help needed moving from lying on your back to sitting on the side of a flat bed without using bedrails?: None Help needed moving to and from a bed to a chair (including a wheelchair)?: A Little Help needed standing up from a chair using your arms (e.g., wheelchair or bedside chair)?: A Little Help needed to walk in hospital room?: A Little Help needed climbing 3-5 steps with a railing? : A Little 6 Click Score: 20    End of Session Equipment Utilized During Treatment: Gait belt Activity Tolerance: Patient tolerated treatment well Patient left: in chair;with call bell/phone within reach;with chair alarm set   PT Visit Diagnosis: Unsteadiness on feet (R26.81);Muscle weakness (generalized) (M62.81)    Time: 7680-8811 PT Time Calculation (min) (ACUTE ONLY): 16 min   Charges:   PT Evaluation $PT Eval Low Complexity: [place] Pager [phone] Office [place] 07/16/2018, 2:45 PM

## 2018-07-16 NOTE — Evaluation (Signed)
Clinical/Bedside Swallow Evaluation Patient Details  Name: Kristina [name] MRN: [id number] Date of Birth: 1931-12-29  Today's Date: 07/16/2018 Time: SLP Start Time (ACUTE ONLY): 0909 SLP Stop Time (ACUTE ONLY): 0917 SLP Time Calculation (min) (ACUTE ONLY): 8 min  Past Medical History:  Past Medical History:  Diagnosis Date  . Benign paroxysmal positional vertigo   . Diverticulosis   . Duodenal ulcer   . Endometrial cancer [facility])    age 85  . GERD (gastroesophageal reflux disease)   . Hypertension   . Hypothyroidism   . Memory loss   . Peripheral neuropathy   . Tubulovillous adenoma polyp of colon 04/1998   Past Surgical History:  Past Surgical History:  Procedure Laterality Date  . CATARACT EXTRACTION    . CHOLECYSTECTOMY    . FOOT SURGERY     Right  . KNEE SURGERY     Right  . VAGINAL HYSTERECTOMY     HPI:  Kristina [name] is a 85 y.o. female with medical history significant of benign positional vertigo, diverticulosis, GERD, hypertension, hypothyroidism, and peripheral neuropathy who presents to the emergency department with a chief complaint of generalized weakness increased fatigue and frequent falls.  Daughter reports is been going on for about 2 weeks. No acute findings on MRI 7/5  Assessment / Plan / Recommendation Clinical Impression  Pt presents with functional swallowing as assessed clinically.  Pt tolerated all consistencies trialed with no clinical s/s of aspiration, and achieved good oral clearance of solids.   Recommend continuing regular diet with thin liquids.    Pt denies changes to cognition.  Although she endorses occasional word finiding difficulties she states these are consistent with baseline.  Pt may not be reliable reporter given underlying dementia.  Cognitive linguistic evaluation to follow. SLP Visit Diagnosis: Dysphagia, unspecified (R13.10)    Aspiration Risk  No limitations    Diet Recommendation Regular;Thin liquid   Liquid  Administration via: Cup;Straw Medication Administration: Whole meds with liquid Supervision: Patient able to self feed Compensations: Slow rate;Small sips/bites Postural Changes: Seated upright at 90 degrees    Other  Recommendations Oral Care Recommendations: Oral care BID   Follow up Recommendations None      Frequency and Duration            Prognosis        Swallow Study   General Date of Onset: 07/16/18 HPI: Kristina [name] is a 85 y.o. female with medical history significant of benign positional vertigo, diverticulosis, GERD, hypertension, hypothyroidism, and peripheral neuropathy who presents to the emergency department with a chief complaint of generalized weakness increased fatigue and frequent falls.  Daughter reports is been going on for about 2 weeks. Type of Study: Bedside Swallow Evaluation Diet Prior to this Study: Regular Temperature Spikes Noted: No History of Recent Intubation: No Behavior/Cognition: Alert;Cooperative;Pleasant mood Oral Cavity Assessment: Within Functional Limits Oral Care Completed by SLP: No Oral Cavity - Dentition: Adequate natural dentition Vision: Functional for self-feeding Self-Feeding Abilities: Able to feed self Patient Positioning: Upright in chair Baseline Vocal Quality: Normal Volitional Cough: Strong Volitional Swallow: Able to elicit    Oral/Motor/Sensory Function Overall Oral Motor/Sensory Function: Mild impairment Facial ROM: Reduced left Facial Symmetry: Abnormal symmetry left Lingual Symmetry: Abnormal symmetry left   Ice Chips Ice chips: Not tested   Thin Liquid Thin Liquid: Within functional limits    Nectar Thick Nectar Thick Liquid: Not tested   Honey Thick Honey Thick Liquid: Not tested   Puree Puree: Within functional  limits   Solid     Solid: Within functional limits      Celedonio Savage, [place], [place] Office: [phone]; Pager [phone] 07/16/2018,10:32  AM

## 2018-07-17 ENCOUNTER — Other Ambulatory Visit: Payer: Self-pay | Admitting: Family Medicine

## 2018-07-17 LAB — BASIC METABOLIC PANEL
Anion gap: 8 (ref 5–15)
BUN: 35 mg/dL — ABNORMAL HIGH (ref 8–23)
CO2: 24 mmol/L (ref 22–32)
Calcium: 8.6 mg/dL — ABNORMAL LOW (ref 8.9–10.3)
Chloride: 113 mmol/L — ABNORMAL HIGH (ref 98–111)
Creatinine, Ser: 1.2 mg/dL — ABNORMAL HIGH (ref 0.44–1.00)
GFR calc Af Amer: 48 mL/min — ABNORMAL LOW (ref >60)
GFR calc non Af Amer: 41 mL/min — ABNORMAL LOW (ref >60)
Glucose, Bld: 103 mg/dL — ABNORMAL HIGH (ref 70–99)
Potassium: 3.9 mmol/L (ref 3.5–5.1)
Sodium: 145 mmol/L (ref 135–145)

## 2018-07-17 LAB — CBC
HCT: 35.3 % — ABNORMAL LOW (ref 36.0–46.0)
Hemoglobin: 11.2 g/dL — ABNORMAL LOW (ref 12.0–15.0)
MCH: 31.3 pg (ref 26.0–34.0)
MCHC: 31.7 g/dL (ref 30.0–36.0)
MCV: 98.6 fL (ref 80.0–100.0)
Platelets: 231 10*3/uL (ref 150–400)
RBC: 3.58 MIL/uL — ABNORMAL LOW (ref 3.87–5.11)
RDW: 14.6 % (ref 11.5–15.5)
WBC: 12.2 10*3/uL — ABNORMAL HIGH (ref 4.0–10.5)
nRBC: 0 % (ref 0.0–0.2)

## 2018-07-17 LAB — MAGNESIUM: Magnesium: 1.9 mg/dL (ref 1.7–2.4)

## 2018-07-17 MED ORDER — FLUTICASONE PROPIONATE 50 MCG/ACT NA SUSP: 2.0000 | Freq: Every day | NASAL | Status: AC | PRN

## 2018-07-17 NOTE — Telephone Encounter (Signed)
Pt is currently hospitalized.  Will need hospital f/u once she is d/c'd.

## 2018-07-17 NOTE — Progress Notes (Signed)
Discharged to home.  Son Synetta Shadow came to pick her up.  Was transported to car via w/c.  New rolling walker sent with her.  Son aware.  Son and pt were updated on progress and discharge instructions were reviewed with them both.  All of son's questions answered.  Pt had no questions.  IV access was removed.

## 2018-07-17 NOTE — Telephone Encounter (Signed)
Noted. Will wait for pt to be D/Cd

## 2018-07-17 NOTE — Telephone Encounter (Signed)
Please advise, in office or VV?

## 2018-07-17 NOTE — TOC Transition Note (Signed)
Transition of Care [facility]) - CM/SW Discharge Note   Patient Details  Name: CARLYE PANAMENO MRN: [id number] Date of Birth: [birth year]  [facility] CM/SW Contact:  Pollie Friar, RN Phone Number: 07/17/2018, 1:39 PM   Clinical Narrative:    Pt discharging home with [facility] services. Dorian Pod accepted the referral for [facility].  Rollator delivered to the room. Family to provide transport home.    Final next level of care: Home w Home Health Services Barriers to Discharge: No Barriers Identified   Patient Goals and CMS Choice   CMS Medicare.gov Compare Post Acute Care list provided to:: Patient Represenative (must comment)(daughter) Choice offered to / list presented to : Adult Children  Discharge Placement                       Discharge Plan and Services   Discharge Planning Services: CM Consult Post Acute Care Choice: Home Health, Durable Medical Equipment          DME Arranged: Walker rolling with seat DME Agency: [employer] Date DME Agency Contacted: 07/17/18   Representative spoke with at DME Agency: [place]: PT, OT [place] Agency: [facility] [place] Date [place]: 07/17/18   Representative spoke with at [place]: [place] ([place]) Interventions     Readmission Risk Interventions No flowsheet data found.

## 2018-07-17 NOTE — Discharge Summary (Signed)
Physician Discharge Summary  [name] IHK:[id number] DOB: 1931-03-01 DOA: 07/15/2018  PCP: Midge Minium, MD  Admit date: 07/15/2018 Discharge date: 07/17/2018  Admitted From: home Discharge disposition: home   Recommendations for Outpatient Follow-Up:   1. Encourage PO hydration 2. aricept stopped 3. Follow TSH 4. Follow CBC- no sign of infection   Discharge Diagnosis:   Principal Problem:   AKI (acute kidney injury) ([place]) Active Problems:   Mild cognitive impairment   Hypothyroidism   Abnormality of gait   Weakness   Dehydration   Confusion   Encephalopathy acute   Hyperthyroidism   Leukocytosis    Discharge Condition: Improved.  Diet recommendation: regular  Wound care: None.  Code status: Full.   History of Present Illness:   Kristina [name] is a 85 y.o. female with medical history significant of benign positional vertigo, diverticulosis, GERD, hypertension, hypothyroidism, and peripheral neuropathy who presents to the emergency department with a chief complaint of generalized weakness increased fatigue and frequent falls.  Daughter reports is been going on for about 2 weeks.  It has been worsening for that same amount of time it apparently the patient started taking milligrams p.o. daily and since then family has noted a significant decline in her functional status.  Has had multiple falls at home.  She has increased fatigue and not wanting to eat or drink.  Her gait is extremely unsteady.  He has a history of mild cognitive impairment and sees neurologist as an outpatient.  At her last neurology visit Aricept was added to her Namenda and since then she has had some changes.   Hospital Course by Problem:   1. Dehydration with acute kidney injury:  -continue IVF as patient still appears dehydrated -AM labs -hypernatremia-- encourage PO water intake  2. Acute encephalopathy Associated with weakness and confusion:  -MRI negative for  CVA -symptoms began after aricept started  3. Hyperthyroidism -resume home meds -follow outpatient in case needs to be decreased  4. Leukocytosis: Etiology is unclear  -trending down  5. Cognitive impairment: Noted dehydration may have made it much worse. -stop aricept-- Kristina medication  6.  Abnormality of gait: PT/OT- MRI suggestive of Stable moderate volume loss of the brain including prominent volume loss in the anteromedial temporal lobe which can be seen with neurodegenerative disorders.  -outpatient follow up    Medical Consultants:      Discharge Exam:   Vitals:   07/17/18 0408 07/17/18 0902  BP: (!) 149/65 134/63  Pulse: (!) 51 65  Resp: 18 18  Temp: 97.7 F (36.5 C) 97.6 F (36.4 C)  SpO2: 97% 100%   Vitals:   07/16/18 2001 07/17/18 0002 07/17/18 0408 07/17/18 0902  BP: (!) 139/53 (!) 145/67 (!) 149/65 134/63  Pulse: (!) 55 (!) 53 (!) 51 65  Resp: 18 20 18 18   Temp: 97.6 F (36.4 C) 97.8 F (36.6 C) 97.7 F (36.5 C) 97.6 F (36.4 C)  TempSrc: Oral Oral Axillary Oral  SpO2: 98% 100% 97% 100%  Weight:      Height:        General exam: Appears calm and comfortable.  The results of significant diagnostics from this hospitalization (including imaging, microbiology, ancillary and laboratory) are listed below for reference.     Procedures and Diagnostic Studies:   Dg Chest 2 View  Result Date: 07/15/2018 CLINICAL DATA:  Altered mental status.  Multiple falls. EXAM: CHEST - 2 VIEW COMPARISON:  None. FINDINGS: Multiple  monitoring leads overlie the patient. Normal cardiac and mediastinal contours. No consolidative pulmonary opacities. No pleural effusion or pneumothorax. Thoracic spine degenerative changes. IMPRESSION: No acute cardiopulmonary process. Electronically Signed   By: Lovey Newcomer M.D.   On: 07/15/2018 12:38   Ct Head Wo Contrast  Result Date: 07/15/2018 CLINICAL DATA:  Altered level of consciousness. EXAM: CT HEAD WITHOUT CONTRAST  TECHNIQUE: Contiguous axial images were obtained from the base of the skull through the vertex without intravenous contrast. COMPARISON:  None. FINDINGS: Brain: Mild chronic ischemic white matter disease is noted. No mass effect or midline shift is noted. Ventricular size is within normal limits. There is no evidence of mass lesion, hemorrhage or acute infarction. Vascular: No hyperdense vessel or unexpected calcification. Skull: Normal. Negative for fracture or focal lesion. Sinuses/Orbits: Left maxillary and sphenoid sinusitis is noted. Other: None. IMPRESSION: Mild chronic ischemic white matter disease. No acute intracranial abnormality seen. Electronically Signed   By: Marijo Conception M.D.   On: 07/15/2018 14:04   Mr Brain Wo Contrast  Result Date: 07/16/2018 CLINICAL DATA:  85 y/o F; generalized weakness, fatigue, frequent falls. EXAM: MRI HEAD WITHOUT CONTRAST TECHNIQUE: Multiplanar, multiecho pulse sequences of the brain and surrounding structures were obtained without intravenous contrast. COMPARISON:  07/15/2018 CT head.  10/18/2015 MRI head. FINDINGS: Brain: No acute infarction, hemorrhage, hydrocephalus, extra-axial collection or mass lesion. Moderate volume loss of the brain including prominent volume loss in the anteromedial temporal lobes. Mild chronic microvascular ischemic changes of periventricular white matter. Vascular: Normal flow voids. Skull and upper cervical spine: Normal marrow signal. Sinuses/Orbits: Left sphenoid sinus mucous and left maxillary sinus mucous retention cysts and mucosal thickening. Partial opacification of right mastoid air cells. No abnormal signal of additional included paranasal sinuses or the left mastoid air cells. Other: None. IMPRESSION: 1. No acute intracranial abnormality identified. 2. Stable mild chronic microvascular ischemic changes. Stable moderate volume loss of the brain including prominent volume loss in the anteromedial temporal lobe which can be seen  with neurodegenerative disorders. 3. Left maxillary and sphenoid sinus disease. Right mastoid air cell partial opacification. Electronically Signed   By: Kristine Garbe M.D.   On: 07/16/2018 01:27     Labs:   Basic Metabolic Panel: Recent Labs  Lab 07/15/18 1212 07/15/18 1215 07/15/18 1642 07/16/18 0737 07/17/18 0536  NA 149* 146*  --  147* 145  K 4.6 4.5  --  3.9 3.9  CL  --  111  --  115* 113*  CO2  --  22  --  24 24  GLUCOSE  --  161*  --  144* 103*  BUN  --  51*  --  43* 35*  CREATININE  --  1.86* 1.68* 1.39* 1.20*  CALCIUM  --  9.2  --  8.5* 8.6*  MG  --   --   --   --  1.9   GFR Estimated Creatinine Clearance: 31.2 mL/min (A) (by C-G formula based on SCr of 1.2 mg/dL (H)). Liver Function Tests: Recent Labs  Lab 07/15/18 1215  AST 70*  ALT 19  ALKPHOS 27*  BILITOT 1.4*  PROT 6.7  ALBUMIN 3.2*   No results for input(s): LIPASE, AMYLASE in the last 168 hours. Recent Labs  Lab 07/15/18 1215  AMMONIA 29   Coagulation profile No results for input(s): INR, PROTIME in the last 168 hours.  CBC: Recent Labs  Lab 07/15/18 1212 07/15/18 1215 07/15/18 1642 07/16/18 0737 07/17/18 0536  WBC  --  15.2* 14.8* 11.3* 12.2*  NEUTROABS  --  11.9*  --   --   --   HGB 12.9 12.3 12.2 10.9* 11.2*  HCT 38.0 38.1 37.9 34.4* 35.3*  MCV  --  98.2 98.2 98.3 98.6  PLT  --  254 267 244 231   Cardiac Enzymes: No results for input(s): CKTOTAL, CKMB, CKMBINDEX, TROPONINI in the last 168 hours. BNP: Invalid input(s): POCBNP CBG: Recent Labs  Lab 07/15/18 1213  GLUCAP 136*   D-Dimer No results for input(s): DDIMER in the last 72 hours. Hgb A1c No results for input(s): HGBA1C in the last 72 hours. Lipid Profile No results for input(s): CHOL, HDL, LDLCALC, TRIG, CHOLHDL, LDLDIRECT in the last 72 hours. Thyroid function studies Recent Labs    07/15/18 1215  TSH 0.162*   Anemia work up No results for input(s): VITAMINB12, FOLATE, FERRITIN, TIBC, IRON,  RETICCTPCT in the last 72 hours. Microbiology Recent Results (from the past 240 hour(s))  Urine culture     Status: None   Collection Time: 07/15/18 12:00 PM   Specimen: Urine, Random  Result Value Ref Range Status   Specimen Description URINE, RANDOM  Final   Special Requests NONE  Final   Culture   Final    NO GROWTH Performed at [facility], [address], [place], [place] [address]    Report Status 07/16/2018 FINAL  Final  SARS Coronavirus 2 (CEPHEID - Performed in [facility] lab), Hosp Order     Status: None   Collection Time: 07/15/18  2:39 PM   Specimen: Nasopharyngeal Swab  Result Value Ref Range Status   SARS Coronavirus 2 NEGATIVE NEGATIVE Final    Comment: (NOTE) If result is NEGATIVE SARS-CoV-2 target nucleic acids are NOT DETECTED. The SARS-CoV-2 RNA is generally detectable in upper and lower  respiratory specimens during the acute phase of infection. The lowest  concentration of SARS-CoV-2 viral copies this assay can detect is 250  copies / mL. A negative result does not preclude SARS-CoV-2 infection  and should not be used as the sole basis for treatment or other  patient management decisions.  A negative result may occur with  improper specimen collection / handling, submission of specimen other  than nasopharyngeal swab, presence of viral mutation(s) within the  areas targeted by this assay, and inadequate number of viral copies  (<250 copies / mL). A negative result must be combined with clinical  observations, patient history, and epidemiological information. If result is POSITIVE SARS-CoV-2 target nucleic acids are DETECTED. The SARS-CoV-2 RNA is generally detectable in upper and lower  respiratory specimens dur ing the acute phase of infection.  Positive  results are indicative of active infection with SARS-CoV-2.  Clinical  correlation with patient history and other diagnostic information is  necessary to determine patient infection  status.  Positive results do  not rule out bacterial infection or co-infection with other viruses. If result is PRESUMPTIVE POSTIVE SARS-CoV-2 nucleic acids MAY BE PRESENT.   A presumptive positive result was obtained on the submitted specimen  and confirmed on repeat testing.  While 2019 novel coronavirus  (SARS-CoV-2) nucleic acids may be present in the submitted sample  additional confirmatory testing may be necessary for epidemiological  and / or clinical management purposes  to differentiate between  SARS-CoV-2 and other Sarbecovirus currently known to infect humans.  If clinically indicated additional testing with an alternate test  methodology [id number]) is advised. The SARS-CoV-2 RNA is generally  detectable in  upper and lower respiratory sp ecimens during the acute  phase of infection. The expected result is Negative. Fact Sheet for Patients:  StrictlyIdeas.no Fact Sheet for Healthcare Providers: BankingDealers.co.za This test is not yet approved or cleared by the [place] FDA and has been authorized for detection and/or diagnosis of SARS-CoV-2 by FDA under an Emergency Use Authorization (EUA).  This EUA will remain in effect (meaning this test can be used) for the duration of the COVID-19 declaration under Section 564(b)(1) of the Act, 21 U.S.C. section 360bbb-3(b)(1), unless the authorization is terminated or revoked sooner. Performed at [facility], [place] [address], [place], [place] [address]      Discharge Instructions:   Discharge Instructions    Diet general   Complete by: As directed    Increase activity slowly   Complete by: As directed      Allergies as of 07/17/2018      Reactions   Bee Venom Shortness Of Breath, Itching, Rash, Other (See Comments)   Almost passed out, also   Penicillins Rash   Has patient had a PCN reaction causing immediate rash, facial/tongue/throat swelling, SOB or  lightheadedness with hypotension: Yes Has patient had a PCN reaction causing severe rash involving mucus membranes or skin necrosis: No Has patient had a PCN reaction that required hospitalization: Unknown Has patient had a PCN reaction occurring within the last 10 years: No If all of the above answers are "NO", then may proceed with Cephalosporin use.      Medication List    STOP taking these medications   donepezil 10 MG tablet Commonly known as: ARICEPT     TAKE these medications   atenolol 25 MG tablet Commonly known as: TENORMIN TAKE 1 TABLET BY MOUTH EVERY DAY   [employer] D3 50 MCG (2000 UT) Caps Generic drug: Cholecalciferol TAKE 1 CAPSULE DAILY What changed: how much to take   diphenhydrAMINE 25 MG tablet Commonly known as: BENADRYL Take 2 tablets (50 mg total) by mouth every 6 (six) hours as needed for itching (or hives).   EPINEPHrine 0.3 mg/0.3 mL Soaj injection Commonly known as: EPI-PEN Inject 0.3 mLs (0.3 mg total) into the muscle once as needed for up to 1 dose (severe allergic reaction).   fenofibrate 160 MG tablet TAKE 1 TABLET BY MOUTH EVERY DAY   fluticasone 50 MCG/ACT nasal spray Commonly known as: FLONASE Place 2 sprays into both nostrils daily as needed (for seasonal allergies). SPRAY 2 SPRAYS INTO EACH NOSTRIL EVERY DAY   levothyroxine 75 MCG tablet Commonly known as: SYNTHROID TAKE 1 TABLET BY MOUTH EVERY DAY What changed: when to take this   loperamide 2 MG capsule Commonly known as: IMODIUM Take 2 mg by mouth at bedtime.   memantine 10 MG tablet Commonly known as: NAMENDA TAKE 1 TABLET BY MOUTH TWICE A DAY. What changed:   how much to take  how to take this  when to take this  additional instructions   naproxen sodium 220 MG tablet Commonly known as: ALEVE Take 220 mg by mouth at bedtime.   Systane Preservative Free 0.4-0.3 % Soln Generic drug: Polyethyl Glyc-Propyl Glyc PF Place 1-2 drops into both eyes at bedtime.    triamcinolone ointment 0.1 % Commonly known as: KENALOG APPLY TO AFFECTED AREA TWICE DAILY What changed: See the Kristina instructions.      Follow-up Information    Midge Minium, MD Follow up in 1 week(s).   Specialty: Family Medicine Contact information: [id number] [place] [address] [phone]            Time coordinating discharge: 35 min  Signed:  Geradine Girt DO  [facility] 07/17/2018, 11:41 AM

## 2018-07-17 NOTE — Evaluation (Signed)
Speech Language Pathology Evaluation Patient Details Name: Kristina [name] MRN: [id number] DOB: 1931-10-29 Today's Date: 07/17/2018 Time: 4010-2725 SLP Time Calculation (min) (ACUTE ONLY): 24 min  Problem List:  Patient Active Problem List   Diagnosis Date Noted  . Weakness 07/15/2018  . Dehydration 07/15/2018  . AKI (acute kidney injury) ([place]) 07/15/2018  . Confusion 07/15/2018  . Encephalopathy acute 07/15/2018  . Hyperthyroidism 07/15/2018  . Leukocytosis 07/15/2018  . Hypertriglyceridemia 02/07/2017  . Vitamin D deficiency 08/06/2016  . Physical exam 08/06/2016  . Abnormality of gait 04/23/2016  . Hypothyroidism 02/05/2016  . HTN (hypertension) 02/05/2016  . Mild cognitive impairment 09/23/2015  . Paresthesia 01/28/2014  . Hyperreflexia 01/28/2014  . ABDOMINAL PAIN-RUQ 09/20/2008  . GERD 01/16/2008  . OTHER POSTOPERATIVE FUNCTIONAL DISORDERS 01/16/2008  . DIARRHEA 01/16/2008  . TUBULOVILLOUS ADENOMA, COLON, HX OF 01/10/2008   Past Medical History:  Past Medical History:  Diagnosis Date  . Benign paroxysmal positional vertigo   . Diverticulosis   . Duodenal ulcer   . Endometrial cancer [facility])    age 40  . GERD (gastroesophageal reflux disease)   . Hypertension   . Hypothyroidism   . Memory loss   . Peripheral neuropathy   . Tubulovillous adenoma polyp of colon 04/1998   Past Surgical History:  Past Surgical History:  Procedure Laterality Date  . CATARACT EXTRACTION    . CHOLECYSTECTOMY    . FOOT SURGERY     Right  . KNEE SURGERY     Right  . VAGINAL HYSTERECTOMY     HPI:  Pt is an 85 y.o. female with medical history significant of benign positional vertigo, diverticulosis, GERD, hypertension, hypothyroidism, and peripheral neuropathy who presented to the emergency department with a chief complaint of generalized weakness increased fatigue and frequent falls for about 2 weeks. MRI was negative for acute changes.    Assessment / Plan /  Recommendation Clinical Impression  Per medical record, the pt has had a diagnosis of mild cognitive impairment since 2017. Per the medical note from neurology on 09/23/15, at that time she would forget things, names, misplace things, repeat questions, forget what disease her husband died from, and get lost while driving. Her speech and language skills are currently within normal limits but she did exhibit difficulty with memory, reasoning, orientation, and problem solving. Per Becky Sax, RN the pt's son has also cited current difficulty with memory and from his description, the pt's presentation today appears to be at baseline. Further skilled SLP services are not clinically indicated at this time. Pt and nursing were educated regarding results and recommendations; both parties verbalized understanding as well as agreement with plan of care.    SLP Assessment  SLP Recommendation/Assessment: Patient does not need any further Speech Lanaguage Pathology Services SLP Visit Diagnosis: Cognitive communication deficit (R41.841)    Follow Up Recommendations  None    Frequency and Duration           SLP Evaluation Cognition  Overall Cognitive Status: History of cognitive impairments - at baseline Arousal/Alertness: Awake/alert Orientation Level: Oriented to person;Disoriented to time;Disoriented to place;Oriented to situation(Date: denied; Month: Januray; Day: Monday; Place: home; ) Attention: Focused;Sustained Focused Attention: Appears intact Sustained Attention: Appears intact Memory: Impaired Memory Impairment: Decreased recall of new information;Storage deficit;Retrieval deficit(Immediate: 3/3; delayed: 0/3; with cues: 3/3) Awareness: Impaired Awareness Impairment: Intellectual impairment Problem Solving: Impaired Problem Solving Impairment: Verbal complex       Comprehension  Auditory Comprehension Overall Auditory Comprehension: Appears within functional limits for tasks  assessed Yes/No  Questions: Within Functional Limits Basic Biographical Questions: (5/5) Complex Questions: (4/5) Paragraph Comprehension (via yes/no questions): (0/4) Commands: Within Functional Limits Two Step Basic Commands: (4/4) Multistep Basic Commands: (4/4) Interfering Components: Attention;Working Curator: Within Sports administrator    Expression Expression Primary Mode of Expression: Verbal Verbal Expression Overall Verbal Expression: Appears within functional limits for tasks assessed Initiation: No impairment Automatic Speech: Counting;Day of week;Month of year(WNL) Level of Generative/Spontaneous Verbalization: Conversation Repetition: No impairment(4/5) Naming: No impairment Responsive: (3/5) Confrontation: Within functional limits(10/10) Convergent: (Sentence completion: 5/5) Divergent: Not tested   Oral / Motor  Oral Motor/Sensory Function Overall Oral Motor/Sensory Function: Mild impairment Facial ROM: Reduced left Facial Symmetry: Abnormal symmetry left Lingual Symmetry: Abnormal symmetry left Motor Speech Overall Motor Speech: Appears within functional limits for tasks assessed Respiration: Within functional limits Phonation: Normal Resonance: Within functional limits Articulation: Within functional limitis Intelligibility: Intelligible Motor Planning: Witnin functional limits Motor Speech Errors: Not applicable   Jeniffer Culliver I. Hardin Negus, [place], [place] Office number [phone] Pager [place] 07/17/2018, 2:42 PM

## 2018-07-17 NOTE — TOC Initial Note (Signed)
Transition of Care [facility]) - Initial/Assessment Note    Patient Details  Name: Kristina [name] MRN: [id number] Date of Birth: 09/13/1931  [facility] CM/SW Contact:    Pollie Friar, RN Phone Number: 07/17/2018, 1:39 PM  Clinical Narrative:                   Expected Discharge Plan: Home w Home Health Services Barriers to Discharge: No Barriers Identified   Patient Goals and CMS Choice   CMS Medicare.gov Compare Post Acute Care list provided to:: Patient Represenative (must comment)(daughter) Choice offered to / list presented to : Adult Children  Expected Discharge Plan and Services Expected Discharge Plan: [place]   Discharge Planning Services: CM Consult Post Acute Care Choice: Home Health, Durable Medical Equipment Living arrangements for the past 2 months: Single Family Home Expected Discharge Date: 07/17/18               DME Arranged: Gilford Rile rolling with seat DME Agency: [employer] Date DME Agency Contacted: 07/17/18   Representative spoke with at DME Agency: [place] Arranged: PT, OT [place] Agency: [facility] [place] Date [place]: 07/17/18   Representative spoke with at [place]: Dorian Pod  Prior Living Arrangements/Services Living arrangements for the past 2 months: [place] Lives with:: Adult Children Patient language and need for interpreter reviewed:: Yes(no needs) Do you feel safe going back to the place where you live?: Yes      Need for Family Participation in Patient Care: Yes (Comment) Care giver support system in place?: Yes (comment)(daughter)   Criminal Activity/Legal Involvement Pertinent to Current Situation/Hospitalization: No - Comment as needed  Activities of Daily Living Home Assistive Devices/Equipment: None ADL Screening (condition at time of admission) Patient's cognitive ability adequate to safely complete daily activities?: No Is the patient deaf or have difficulty hearing?: No Does  the patient have difficulty seeing, even when wearing glasses/contacts?: No Does the patient have difficulty concentrating, remembering, or making decisions?: Yes Patient able to express need for assistance with ADLs?: Yes Does the patient have difficulty dressing or bathing?: Yes Independently performs ADLs?: No Communication: Independent Does the patient have difficulty walking or climbing stairs?: Yes Weakness of Legs: Both Weakness of Arms/Hands: None  Permission Sought/Granted                  Emotional Assessment           Psych Involvement: No (comment)  Admission diagnosis:  Multiple Falls; Memory Problems Patient Active Problem List   Diagnosis Date Noted  . Weakness 07/15/2018  . Dehydration 07/15/2018  . AKI (acute kidney injury) ([place]) 07/15/2018  . Confusion 07/15/2018  . Encephalopathy acute 07/15/2018  . Hyperthyroidism 07/15/2018  . Leukocytosis 07/15/2018  . Hypertriglyceridemia 02/07/2017  . Vitamin D deficiency 08/06/2016  . Physical exam 08/06/2016  . Abnormality of gait 04/23/2016  . Hypothyroidism 02/05/2016  . HTN (hypertension) 02/05/2016  . Mild cognitive impairment 09/23/2015  . Paresthesia 01/28/2014  . Hyperreflexia 01/28/2014  . ABDOMINAL PAIN-RUQ 09/20/2008  . GERD 01/16/2008  . OTHER POSTOPERATIVE FUNCTIONAL DISORDERS 01/16/2008  . DIARRHEA 01/16/2008  . TUBULOVILLOUS ADENOMA, COLON, HX OF 01/10/2008   PCP:  Midge Minium, MD Pharmacy:   [id number] - [place], [place] - [address] [place] [address] Phone: [phone] Fax: [phone]     Social Determinants of Health (SDOH) Interventions  Readmission Risk Interventions No flowsheet data found.

## 2018-07-18 ENCOUNTER — Ambulatory Visit: Payer: Self-pay | Admitting: Family Medicine

## 2018-07-18 ENCOUNTER — Telehealth: Payer: Self-pay

## 2018-07-18 NOTE — Telephone Encounter (Signed)
Ok for refill? 

## 2018-07-18 NOTE — Telephone Encounter (Signed)
Daughter reports pt recently released from hospital. She has noted redness "Between pts butt cheeks, red like a rash." States has 2 small open areas, 1 dime size, 1 smaller. States pt has not been cleaning herself thoroughly after BR. States hospital sent home anti-fungal powder for use. Instructed to continue use for now and would route to practice for Dr. Virgil Benedict review. After hours call. Daughter states pt has virtual appt for F/U hospital Monday. Questioning if able to do sooner.  Please advise: [phone]  Reason for Disposition . Red, moist, irritated area between skin folds (or under larger breasts)  Answer Assessment - Initial Assessment Questions 1. APPEARANCE of RASH: "Describe the rash."      Diffuse redness, 2 small open areas 2. LOCATION: "Where is the rash located?"        "Between butt cheeks" 3. NUMBER: "How many spots are there?"      2 4. SIZE: "How big are the spots?" (Inches, centimeters or compare to size of a coin)      1 dime size, one smaller 5. ONSET: "When did the rash start?"      While in hospital 6. ITCHING: "Does the rash itch?" If so, ask: "How bad is the itch?"  (Scale 1-10; or mild, moderate, severe)     no 7. PAIN: "Does the rash hurt?" If so, ask: "How bad is the pain?"  (Scale 1-10; or mild, moderate, severe)     no 8. OTHER SYMPTOMS: "Do you have any other symptoms?" (e.g., fever)    no  Protocols used: RASH OR REDNESS - LOCALIZED-A-AH

## 2018-07-18 NOTE — Telephone Encounter (Signed)
Spoke with patients daughter, [name].   Transition Care Management Follow-up Telephone Call  Admit date: 07/15/2018 Discharge date: 07/17/2018 Principal Problem: AKI (acute kidney injury)   How have you been since you were released from the hospital? "She's doing so much better"   Do you understand why you were in the hospital? yes   Do you understand the discharge instructions? yes   Where were you discharged to? Home. Resides with daughter and SIL.    Items Reviewed:  Medications reviewed: yes  Allergies reviewed: yes  Dietary changes reviewed: yes  Referrals reviewed: yes, PT/OT ordered at discharge   Functional Questionnaire:   Activities of Daily Living (ADLs):   She states they are independent in the following: ambulation, bathing and hygiene, feeding, continence, grooming, toileting and dressing. Daughter is present for ADLs.  States they require assistance with the following: None.    Any transportation issues/concerns?: no   Any patient concerns? No. Daughter states pt is doing much better. Improvement in completing ADLs and cognitive function.    Confirmed importance and date/time of follow-up visits scheduled yes  Provider Appointment booked with PCP on 07/24/2018 via VV, daughter to be present.   Confirmed with patient if condition begins to worsen call PCP or go to the ER.  Patient was given the office number and encouraged to call back with question or concerns.  : yes

## 2018-07-19 NOTE — Telephone Encounter (Signed)
Patient daughter notified of PCP recommendations and is agreement and expresses an understanding.   Ok for PEC to Discuss results / PCP recommendations / Schedule patient.   

## 2018-07-19 NOTE — Telephone Encounter (Signed)
Continue antifungal powder and as much as possible, keep area clean and dry (dry is important).  Try and reposition patient regularly so she's not sitting or lying on the same area for prolonged periods of time

## 2018-07-24 ENCOUNTER — Ambulatory Visit (INDEPENDENT_AMBULATORY_CARE_PROVIDER_SITE_OTHER): Payer: Medicare Other | Admitting: Family Medicine

## 2018-07-24 ENCOUNTER — Other Ambulatory Visit: Payer: Self-pay

## 2018-07-24 ENCOUNTER — Encounter: Payer: Self-pay | Admitting: Family Medicine

## 2018-07-24 DIAGNOSIS — D72829 Elevated white blood cell count, unspecified: Secondary | ICD-10-CM | POA: Diagnosis not present

## 2018-07-24 DIAGNOSIS — E039 Hypothyroidism, unspecified: Secondary | ICD-10-CM

## 2018-07-24 DIAGNOSIS — E860 Dehydration: Secondary | ICD-10-CM | POA: Diagnosis not present

## 2018-07-24 DIAGNOSIS — R531 Weakness: Secondary | ICD-10-CM

## 2018-07-24 DIAGNOSIS — N179 Acute kidney failure, unspecified: Secondary | ICD-10-CM

## 2018-07-24 NOTE — Progress Notes (Signed)
I have discussed the procedure for the virtual visit with the patient who has given consent to proceed with assessment and treatment.   Unable to obtain vitals. Physical therapy came Friday July 10th for the first time and said all vitals were normal.   Davis Gourd, CMA

## 2018-07-24 NOTE — Progress Notes (Signed)
Virtual Visit via Video   I connected with patient on 07/24/18 at  9:30 AM EDT by a video enabled telemedicine application and verified that I am speaking with the correct person using two identifiers.  Location patient: Home Location provider: Acup[facility]urist, Office Persons participating in the virtual visit: Patient, Provider, [place] (Jess B)  I discussed the limitations of evaluation and management by telemedicine and the availability of in person appointments. The patient expressed understanding and agreed to proceed.  Subjective:   HPI:   Hospital f/u- pt was admitted 7/4-7/6 w/ AKI, falls, dehydration.  She also had acute confusion which was attributed to the recent addition of Aricept.  Stopped Aricept and confusion has improved.  Pt reports she is eating and drinking more normally.  Increased her water intake- daughter is working with her.  Has had initial PT eval and has OT eval today.  Pt developed multiple pressure sores in gluteal crease.  Surrounding redness has improved but 3 small sores remain.  Using antifungal powder and keeping area clean and dry.  No CP, SOB, HAs, abd pain, N/V.  TSH was low while hospitalized- no med changes were made.  WBC was also elevated but trending down.  Reviewed notes, labs, imaging, and d/c summary. Reviewed past medical, surgical, family and social histories.   ROS:   See pertinent positives and negatives per HPI.  Patient Active Problem List   Diagnosis Date Noted  . Weakness 07/15/2018  . Dehydration 07/15/2018  . AKI (acute kidney injury) ([place]) 07/15/2018  . Confusion 07/15/2018  . Encephalopathy acute 07/15/2018  . Hyperthyroidism 07/15/2018  . Leukocytosis 07/15/2018  . Hypertriglyceridemia 02/07/2017  . Vitamin D deficiency 08/06/2016  . Physical exam 08/06/2016  . Abnormality of gait 04/23/2016  . Hypothyroidism 02/05/2016  . HTN (hypertension) 02/05/2016  . Mild cognitive impairment 09/23/2015  . Paresthesia  01/28/2014  . Hyperreflexia 01/28/2014  . ABDOMINAL PAIN-RUQ 09/20/2008  . GERD 01/16/2008  . OTHER POSTOPERATIVE FUNCTIONAL DISORDERS 01/16/2008  . DIARRHEA 01/16/2008  . TUBULOVILLOUS ADENOMA, COLON, HX OF 01/10/2008    Social History   Tobacco Use  . Smoking status: Never Smoker  . Smokeless tobacco: Never Used  Substance Use Topics  . Alcohol use: No    Alcohol/week: 0.0 standard drinks    Current Outpatient Medications:  .  atenolol (TENORMIN) 25 MG tablet, TAKE 1 TABLET BY MOUTH EVERY DAY (Patient taking differently: Take 25 mg by mouth daily. ), Disp: 90 tablet, Rfl: 1 .  [employer] D3 50 MCG (2000 UT) CAPS, TAKE 1 CAPSULE DAILY (Patient taking differently: Take 2,000 Units by mouth daily. ), Disp: 100 capsule, Rfl: 0 .  diphenhydrAMINE (BENADRYL) 25 MG tablet, Take 2 tablets (50 mg total) by mouth every 6 (six) hours as needed for itching (or hives)., Disp: 60 tablet, Rfl: 0 .  EPINEPHrine 0.3 mg/0.3 mL IJ SOAJ injection, Inject 0.3 mLs (0.3 mg total) into the muscle once as needed for up to 1 dose (severe allergic reaction)., Disp: 1 Device, Rfl: 1 .  fenofibrate 160 MG tablet, TAKE 1 TABLET BY MOUTH EVERY DAY (Patient taking differently: Take 160 mg by mouth daily. ), Disp: 90 tablet, Rfl: 2 .  fluticasone (FLONASE) 50 MCG/ACT nasal spray, Place 2 sprays into both nostrils daily as needed (for seasonal allergies). SPRAY 2 SPRAYS INTO EACH NOSTRIL EVERY DAY, Disp: , Rfl:  .  levothyroxine (SYNTHROID, LEVOTHROID) 75 MCG tablet, TAKE 1 TABLET BY MOUTH EVERY DAY (Patient taking differently: Take 75 mcg by  mouth daily before breakfast. ), Disp: 90 tablet, Rfl: 1 .  loperamide (IMODIUM) 2 MG capsule, Take 2 mg by mouth at bedtime. , Disp: , Rfl:  .  memantine (NAMENDA) 10 MG tablet, TAKE 1 TABLET BY MOUTH TWICE A DAY. (Patient taking differently: Take 10 mg by mouth 2 (two) times daily. ), Disp: 180 tablet, Rfl: 4 .  naproxen sodium (ANAPROX) 220 MG tablet, Take 220 mg by mouth at bedtime. ,  Disp: , Rfl:  .  Polyethyl Glyc-Propyl Glyc PF (SYSTANE PRESERVATIVE FREE) 0.4-0.3 % SOLN, Place 1-2 drops into both eyes at bedtime., Disp: , Rfl:  .  triamcinolone ointment (KENALOG) 0.1 %, Apply 1 application topically 2 (two) times daily as needed (as directed, to affected areas)., Disp: 80 g, Rfl: 1  Allergies  Allergen Reactions  . Bee Venom Shortness Of Breath, Itching, Rash and Other (See Comments)    Almost passed out, also  . Penicillins Rash    Has patient had a PCN reaction causing immediate rash, facial/tongue/throat swelling, SOB or lightheadedness with hypotension: Yes Has patient had a PCN reaction causing severe rash involving mucus membranes or skin necrosis: No Has patient had a PCN reaction that required hospitalization: Unknown Has patient had a PCN reaction occurring within the last 10 years: No If all of the above answers are "NO", then may proceed with Cephalosporin use.     Objective:   There were no vitals taken for this visit.  AAOx3, NAD NCAT, EOMI No obvious CN deficits Coloring WNL Pt is able to speak clearly, coherently without shortness of breath or increased work of breathing.  Thought process is linear.  Mood is appropriate.   Assessment and Plan:   AKI- resolving.  Daughter reports pt is working on her water intake.  Will repeat BMP and assess both Na and Cr as these were abnormal in hospital  Dehydration- resolving.  Again daughter is working on oral intake of both fluids and food.  Encouraged pt to eat and drink regularly  Weakness- pt has assessments by PT and OT scheduled w/ therapy to start shortly after.  Hypothyroid- pt has hx of hypothyroid but TSH was suppressed during her recent hospitalization.  Check TSH and determine whether dose needs to be adjusted  Leukocytosis- this was resolving but will repeat to ensure WNL.    Annye Asa, MD 07/24/2018

## 2018-07-26 ENCOUNTER — Other Ambulatory Visit: Payer: Self-pay

## 2018-07-26 ENCOUNTER — Ambulatory Visit (INDEPENDENT_AMBULATORY_CARE_PROVIDER_SITE_OTHER): Payer: Medicare Other

## 2018-07-26 DIAGNOSIS — N179 Acute kidney failure, unspecified: Secondary | ICD-10-CM | POA: Diagnosis not present

## 2018-07-26 DIAGNOSIS — D72829 Elevated white blood cell count, unspecified: Secondary | ICD-10-CM | POA: Diagnosis not present

## 2018-07-26 DIAGNOSIS — E039 Hypothyroidism, unspecified: Secondary | ICD-10-CM

## 2018-07-26 DIAGNOSIS — E860 Dehydration: Secondary | ICD-10-CM | POA: Diagnosis not present

## 2018-07-26 LAB — BASIC METABOLIC PANEL
BUN: 20 mg/dL (ref 6–23)
CO2: 30 mEq/L (ref 19–32)
Calcium: 8.9 mg/dL (ref 8.4–10.5)
Chloride: 100 mEq/L (ref 96–112)
Creatinine, Ser: 1.34 mg/dL — ABNORMAL HIGH (ref 0.40–1.20)
GFR: 37.62 mL/min — ABNORMAL LOW (ref >60.00)
Glucose, Bld: 102 mg/dL — ABNORMAL HIGH (ref 70–99)
Potassium: 4.4 mEq/L (ref 3.5–5.1)
Sodium: 137 mEq/L (ref 135–145)

## 2018-07-26 LAB — CBC WITH DIFFERENTIAL/PLATELET
Basophils Absolute: 0.2 10*3/uL — ABNORMAL HIGH (ref 0.0–0.1)
Basophils Relative: 2.2 % (ref 0.0–3.0)
Eosinophils Absolute: 0.1 10*3/uL (ref 0.0–0.7)
Eosinophils Relative: 2 % (ref 0.0–5.0)
HCT: 35.9 % — ABNORMAL LOW (ref 36.0–46.0)
Hemoglobin: 11.9 g/dL — ABNORMAL LOW (ref 12.0–15.0)
Lymphocytes Relative: 38.1 % (ref 12.0–46.0)
Lymphs Abs: 2.8 10*3/uL (ref 0.7–4.0)
MCHC: 33.1 g/dL (ref 30.0–36.0)
MCV: 96.6 fl (ref 78.0–100.0)
Monocytes Absolute: 0.5 10*3/uL (ref 0.1–1.0)
Monocytes Relative: 6.2 % (ref 3.0–12.0)
Neutro Abs: 3.8 10*3/uL (ref 1.4–7.7)
Neutrophils Relative %: 51.5 % (ref 43.0–77.0)
Platelets: 312 10*3/uL (ref 150.0–400.0)
RBC: 3.71 Mil/uL — ABNORMAL LOW (ref 3.87–5.11)
RDW: 14.9 % (ref 11.5–15.5)
WBC: 7.4 10*3/uL (ref 4.0–10.5)

## 2018-07-26 LAB — TSH: TSH: 2.05 u[IU]/mL (ref 0.35–4.50)

## 2018-07-27 ENCOUNTER — Telehealth: Payer: Self-pay | Admitting: Family Medicine

## 2018-07-27 NOTE — Telephone Encounter (Signed)
I have placed a HH Cert. And plan of care in the bin up front with a charge sheet.  °

## 2018-07-27 NOTE — Telephone Encounter (Signed)
Paperwork given to PCP for completion.  

## 2018-07-28 DIAGNOSIS — K219 Gastro-esophageal reflux disease without esophagitis: Secondary | ICD-10-CM

## 2018-07-28 DIAGNOSIS — Z7951 Long term (current) use of inhaled steroids: Secondary | ICD-10-CM

## 2018-07-28 DIAGNOSIS — E039 Hypothyroidism, unspecified: Secondary | ICD-10-CM

## 2018-07-28 DIAGNOSIS — Z9181 History of falling: Secondary | ICD-10-CM

## 2018-07-28 DIAGNOSIS — G3184 Mild cognitive impairment, so stated: Secondary | ICD-10-CM | POA: Diagnosis not present

## 2018-07-28 DIAGNOSIS — Z8542 Personal history of malignant neoplasm of other parts of uterus: Secondary | ICD-10-CM

## 2018-07-28 DIAGNOSIS — K5790 Diverticulosis of intestine, part unspecified, without perforation or abscess without bleeding: Secondary | ICD-10-CM

## 2018-07-28 DIAGNOSIS — G9340 Encephalopathy, unspecified: Secondary | ICD-10-CM | POA: Diagnosis not present

## 2018-07-28 DIAGNOSIS — G629 Polyneuropathy, unspecified: Secondary | ICD-10-CM | POA: Diagnosis not present

## 2018-07-28 DIAGNOSIS — E559 Vitamin D deficiency, unspecified: Secondary | ICD-10-CM

## 2018-07-28 DIAGNOSIS — E785 Hyperlipidemia, unspecified: Secondary | ICD-10-CM

## 2018-07-28 DIAGNOSIS — I10 Essential (primary) hypertension: Secondary | ICD-10-CM | POA: Diagnosis not present

## 2018-07-28 NOTE — Telephone Encounter (Signed)
fyi

## 2018-07-28 NOTE — Telephone Encounter (Signed)
Form completed and placed in basket  

## 2018-07-31 NOTE — Telephone Encounter (Signed)
Picked  up and faxed  

## 2018-08-18 ENCOUNTER — Other Ambulatory Visit: Payer: Self-pay | Admitting: Family Medicine

## 2018-08-18 DIAGNOSIS — E781 Pure hyperglyceridemia: Secondary | ICD-10-CM

## 2018-09-02 ENCOUNTER — Other Ambulatory Visit: Payer: Self-pay | Admitting: Family Medicine

## 2018-09-21 ENCOUNTER — Encounter: Payer: Medicare Other | Admitting: Family Medicine

## 2018-10-05 ENCOUNTER — Encounter: Payer: Medicare Other | Admitting: Family Medicine

## 2018-10-06 ENCOUNTER — Encounter: Payer: Medicare Other | Admitting: Family Medicine

## 2018-11-07 ENCOUNTER — Other Ambulatory Visit: Payer: Self-pay

## 2018-11-07 ENCOUNTER — Ambulatory Visit (INDEPENDENT_AMBULATORY_CARE_PROVIDER_SITE_OTHER): Payer: Medicare Other | Admitting: Family Medicine

## 2018-11-07 ENCOUNTER — Encounter: Payer: Self-pay | Admitting: Family Medicine

## 2018-11-07 VITALS — BP 132/84 | HR 64 | Temp 97.6°F | Resp 17 | Ht 62.0 in | Wt 138.0 lb

## 2018-11-07 DIAGNOSIS — Z0000 Encounter for general adult medical examination without abnormal findings: Secondary | ICD-10-CM

## 2018-11-07 DIAGNOSIS — I10 Essential (primary) hypertension: Secondary | ICD-10-CM

## 2018-11-07 DIAGNOSIS — Z23 Encounter for immunization: Secondary | ICD-10-CM

## 2018-11-07 DIAGNOSIS — E039 Hypothyroidism, unspecified: Secondary | ICD-10-CM | POA: Diagnosis not present

## 2018-11-07 DIAGNOSIS — E781 Pure hyperglyceridemia: Secondary | ICD-10-CM | POA: Diagnosis not present

## 2018-11-07 DIAGNOSIS — E559 Vitamin D deficiency, unspecified: Secondary | ICD-10-CM

## 2018-11-07 DIAGNOSIS — F0390 Unspecified dementia without behavioral disturbance: Secondary | ICD-10-CM

## 2018-11-07 LAB — LIPID PANEL
Cholesterol: 142 mg/dL (ref 0–200)
HDL: 46.3 mg/dL (ref >39.00)
LDL Cholesterol: 72 mg/dL (ref 0–99)
NonHDL: 95.95
Total CHOL/HDL Ratio: 3
Triglycerides: 120 mg/dL (ref 0.0–149.0)
VLDL: 24 mg/dL (ref 0.0–40.0)

## 2018-11-07 LAB — HEPATIC FUNCTION PANEL
ALT: 10 U/L (ref 0–35)
AST: 23 U/L (ref 0–37)
Albumin: 3.7 g/dL (ref 3.5–5.2)
Alkaline Phosphatase: 25 U/L — ABNORMAL LOW (ref 39–117)
Bilirubin, Direct: 0.2 mg/dL (ref 0.0–0.3)
Total Bilirubin: 0.5 mg/dL (ref 0.2–1.2)
Total Protein: 6.6 g/dL (ref 6.0–8.3)

## 2018-11-07 LAB — BASIC METABOLIC PANEL
BUN: 27 mg/dL — ABNORMAL HIGH (ref 6–23)
CO2: 32 mEq/L (ref 19–32)
Calcium: 9.2 mg/dL (ref 8.4–10.5)
Chloride: 100 mEq/L (ref 96–112)
Creatinine, Ser: 1.27 mg/dL — ABNORMAL HIGH (ref 0.40–1.20)
GFR: 40 mL/min — ABNORMAL LOW (ref >60.00)
Glucose, Bld: 133 mg/dL — ABNORMAL HIGH (ref 70–99)
Potassium: 4.6 mEq/L (ref 3.5–5.1)
Sodium: 138 mEq/L (ref 135–145)

## 2018-11-07 LAB — CBC WITH DIFFERENTIAL/PLATELET
Basophils Absolute: 0.1 10*3/uL (ref 0.0–0.1)
Basophils Relative: 1.1 % (ref 0.0–3.0)
Eosinophils Absolute: 0.1 10*3/uL (ref 0.0–0.7)
Eosinophils Relative: 1.5 % (ref 0.0–5.0)
HCT: 36.2 % (ref 36.0–46.0)
Hemoglobin: 12.1 g/dL (ref 12.0–15.0)
Lymphocytes Relative: 33.4 % (ref 12.0–46.0)
Lymphs Abs: 2.6 10*3/uL (ref 0.7–4.0)
MCHC: 33.3 g/dL (ref 30.0–36.0)
MCV: 96.2 fl (ref 78.0–100.0)
Monocytes Absolute: 0.4 10*3/uL (ref 0.1–1.0)
Monocytes Relative: 5.7 % (ref 3.0–12.0)
Neutro Abs: 4.5 10*3/uL (ref 1.4–7.7)
Neutrophils Relative %: 58.3 % (ref 43.0–77.0)
Platelets: 357 10*3/uL (ref 150.0–400.0)
RBC: 3.76 Mil/uL — ABNORMAL LOW (ref 3.87–5.11)
RDW: 14.4 % (ref 11.5–15.5)
WBC: 7.7 10*3/uL (ref 4.0–10.5)

## 2018-11-07 NOTE — Progress Notes (Signed)
Subjective:    Patient ID: Kristina [name], female    DOB: 1931-03-07, 85 y.o.   MRN: [id number]  HPI Here today for CPE and MWV.  Risk Factors: HTN- chronic problem, on Atenolol 25mg  w/ good control Hypothyroid- chronic problem, on Levothyroxine 88mcg daily Cognitive impairment- chronic problem, on Namenda and follows w/ neurology.  Pt is down 20 lbs b/c she is no longer eating regularly.  Pt is now waking up at 1-3am thinking it is morning.  No wandering. Hypertriglyceridemia- chronic problem, on Fenofibrate 160mg  daily. Pt reports she is 'cutting back' on her intake which explains her weight loss. Physical Activity: no physical activity Fall Risk: moderate to high given cognitive impairment Depression: pt unable to answer depression screening questions Hearing: decreased to conversational tones and whispered voice ADL's: needs assistance from family w/ bathing and medication Cognitive: known impairment, follows w/ neurology Home Safety: lives w/ daughter and son-in-law, safe at home Height, Weight, BMI, Visual Acuity: see vitals, vision corrected to 20/20 w/ glasses Counseling: no longer doing colonoscopy, mammo, DEXA.  Will get flu shot today. Labs Ordered: See A&P Care Plan: See A&P   Reviewed past medical, surgical, family and social histories.   Health Maintenance  Topic Date Due  . TETANUS/TDAP  07/24/2019 (Originally 03/11/2013)  . INFLUENZA VACCINE  08/12/2019  . DEXA SCAN  Completed  . COVID-19 Vaccine  Completed  . PNA vac Low Risk Adult  Completed     Patient Care Team    Relationship Specialty Notifications Start End  Midge Minium, MD PCP - General Family Medicine  02/05/16   Marcial Pacas, MD Consulting Physician Neurology  02/05/16   Ladene Artist, MD Consulting Physician Gastroenterology  02/05/16      Review of Systems Patient reports no vision/ hearing changes, adenopathy,fever, weight change,  persistant/recurrent hoarseness , swallowing issues,  chest pain, palpitations, edema, persistant/recurrent cough, hemoptysis, dyspnea (rest/exertional/paroxysmal nocturnal), gastrointestinal bleeding (melena, rectal bleeding), abdominal pain, significant heartburn, bowel changes, GU symptoms (dysuria, hematuria, incontinence), Gyn symptoms (abnormal  bleeding, pain),  syncope, focal weakness, memory loss, numbness & tingling, skin/hair/nail changes, abnormal bruising or bleeding, anxiety, or depression.  (unclear how accurate this is given pt's dementia)    Objective:   Physical Exam General Appearance:    Alert, cooperative, no distress, appears stated age  Head:    Normocephalic, without obvious abnormality, atraumatic  Eyes:    PERRL, conjunctiva/corneas clear, EOM's intact, fundi    benign, both eyes  Ears:    Normal TM's and external ear canals, both ears  Nose:   Deferred due to COVID  Throat:   Neck:   Supple, symmetrical, trachea midline, no adenopathy;    Thyroid: no enlargement/tenderness/nodules  Back:     Symmetric, no curvature, ROM normal, no CVA tenderness  Lungs:     Clear to auscultation bilaterally, respirations unlabored  Chest Wall:    No tenderness or deformity   Heart:    Regular rate and rhythm, S1 and S2 normal, no murmur, rub   or gallop  Breast Exam:    Deferred  Abdomen:     Soft, non-tender, bowel sounds active all four quadrants,    no masses, no organomegaly  Genitalia:    Deferred  Rectal:    Extremities:   Extremities normal, atraumatic, no cyanosis or edema  Pulses:   2+ and symmetric all extremities  Skin:   Skin color, texture, turgor normal, no rashes or lesions  Lymph nodes:   Cervical,  supraclavicular, and axillary nodes normal  Neurologic:   CNII-XII intact, normal sensation and reflexes    throughout          Assessment & Plan:

## 2018-11-07 NOTE — Patient Instructions (Addendum)
Follow up in 6 months to recheck BP, thyroid, cholesterol We'll notify you of your lab results and make any changes if needed Continue to eat regularly and add Ensure to improve protein intake Call with any questions or concerns Stay Safe!!   Preventive Care 65 Years and Older, Female Preventive care refers to lifestyle choices and visits with your health care provider that can promote health and wellness. This includes:  A yearly physical exam. This is also called an annual well check.  Regular dental and eye exams.  Immunizations.  Screening for certain conditions.  Healthy lifestyle choices, such as diet and exercise. What can I expect for my preventive care visit? Physical exam Your health care provider will check:  Height and weight. These may be used to calculate body mass index (BMI), which is a measurement that tells if you are at a healthy weight.  Heart rate and blood pressure.  Your skin for abnormal spots. Counseling Your health care provider may ask you questions about:  Alcohol, tobacco, and drug use.  Emotional well-being.  Home and relationship well-being.  Sexual activity.  Eating habits.  History of falls.  Memory and ability to understand (cognition).  Work and work Statistician.  Pregnancy and menstrual history. What immunizations do I need?  Influenza (flu) vaccine  This is recommended every year. Tetanus, diphtheria, and pertussis (Tdap) vaccine  You may need a Td booster every 10 years. Varicella (chickenpox) vaccine  You may need this vaccine if you have not already been vaccinated. Zoster (shingles) vaccine  You may need this after age 25. Pneumococcal conjugate (PCV13) vaccine  One dose is recommended after age 34. Pneumococcal polysaccharide (PPSV23) vaccine  One dose is recommended after age 70. Measles, mumps, and rubella (MMR) vaccine  You may need at least one dose of MMR if you were born in 1957 or later. You may  also need a second dose. Meningococcal conjugate (MenACWY) vaccine  You may need this if you have certain conditions. Hepatitis A vaccine  You may need this if you have certain conditions or if you travel or work in places where you may be exposed to hepatitis A. Hepatitis B vaccine  You may need this if you have certain conditions or if you travel or work in places where you may be exposed to hepatitis B. Haemophilus influenzae type b (Hib) vaccine  You may need this if you have certain conditions. You may receive vaccines as individual doses or as more than one vaccine together in one shot (combination vaccines). Talk with your health care provider about the risks and benefits of combination vaccines. What tests do I need? Blood tests  Lipid and cholesterol levels. These may be checked every 5 years, or more frequently depending on your overall health.  Hepatitis C test.  Hepatitis B test. Screening  Lung cancer screening. You may have this screening every year starting at age 3 if you have a 30-pack-year history of smoking and currently smoke or have quit within the past 15 years.  Colorectal cancer screening. All adults should have this screening starting at age 19 and continuing until age 37. Your health care provider may recommend screening at age 25 if you are at increased risk. You will have tests every 1-10 years, depending on your results and the type of screening test.  Diabetes screening. This is done by checking your blood sugar (glucose) after you have not eaten for a while (fasting). You may have this done every 1-3 years.  Mammogram. This may be done every 1-2 years. Talk with your health care provider about how often you should have regular mammograms.  BRCA-related cancer screening. This may be done if you have a family history of breast, ovarian, tubal, or peritoneal cancers. Other tests  Sexually transmitted disease (STD) testing.  Bone density scan. This is  done to screen for osteoporosis. You may have this done starting at age 59. Follow these instructions at home: Eating and drinking  Eat a diet that includes fresh fruits and vegetables, whole grains, lean protein, and low-fat dairy products. Limit your intake of foods with high amounts of sugar, saturated fats, and salt.  Take vitamin and mineral supplements as recommended by your health care provider.  Do not drink alcohol if your health care provider tells you not to drink.  If you drink alcohol: ? Limit how much you have to 0-1 drink a day. ? Be aware of how much alcohol is in your drink. In the [place] one drink equals one 12 oz bottle of beer (355 mL), one 5 oz glass of wine (148 mL), or one 1 oz glass of hard liquor (44 mL). Lifestyle  Take daily care of your teeth and gums.  Stay active. Exercise for at least 30 minutes on 5 or more days each week.  Do not use any products that contain nicotine or tobacco, such as cigarettes, e-cigarettes, and chewing tobacco. If you need help quitting, ask your health care provider.  If you are sexually active, practice safe sex. Use a condom or other form of protection in order to prevent STIs (sexually transmitted infections).  Talk with your health care provider about taking a low-dose aspirin or statin. What's next?  Go to your health care provider once a year for a well check visit.  Ask your health care provider how often you should have your eyes and teeth checked.  Stay up to date on all vaccines. This information is not intended to replace advice given to you by your health care provider. Make sure you discuss any questions you have with your health care provider. Document Released: 01/24/2015 Document Revised: 12/22/2017 Document Reviewed: 12/22/2017 Elsevier Patient Education  2020 [employer].

## 2018-11-09 LAB — VITAMIN D 25 HYDROXY (VIT D DEFICIENCY, FRACTURES): VITD: 95.39 ng/mL (ref 30.00–100.00)

## 2018-11-09 LAB — TSH: TSH: 1.43 u[IU]/mL (ref 0.35–4.50)

## 2019-01-22 ENCOUNTER — Telehealth: Payer: Self-pay | Admitting: Family Medicine

## 2019-01-22 NOTE — Telephone Encounter (Signed)
She should absolutely get the COVID vaccine.  Medications will not be impacted

## 2019-01-22 NOTE — Telephone Encounter (Signed)
Pt's daughter called in asking if pt should get the Covid vaccine? She is wanted to know if any of her medication will effect the shot. Pt can be reached at the home #

## 2019-01-22 NOTE — Telephone Encounter (Signed)
Please advise 

## 2019-01-22 NOTE — Telephone Encounter (Signed)
Called and gave the ok per PCP. Pt daughter stated an understanding.

## 2019-01-26 ENCOUNTER — Telehealth: Payer: Self-pay | Admitting: Family Medicine

## 2019-01-26 MED ORDER — EPINEPHRINE 0.3 MG/0.3ML IJ SOAJ: 0.3000 mg | Freq: Once | INTRAMUSCULAR | 2 refills | Status: DC | PRN

## 2019-01-26 NOTE — Telephone Encounter (Signed)
Pt's daughter called in asking for a new script of the Epi pen to [facility] [place].

## 2019-01-26 NOTE — Telephone Encounter (Signed)
Medication filled to pharmacy as requested.   

## 2019-02-12 ENCOUNTER — Telehealth: Payer: Self-pay | Admitting: Family Medicine

## 2019-02-12 MED ORDER — ATENOLOL 25 MG PO TABS: 25.0000 mg | ORAL_TABLET | Freq: Every day | ORAL | 1 refills | Status: DC

## 2019-02-12 NOTE — Telephone Encounter (Signed)
Pt out of refills for her Atenolol [facility] [place]

## 2019-02-12 NOTE — Telephone Encounter (Signed)
Medication filled to pharmacy as requested.   

## 2019-03-07 ENCOUNTER — Other Ambulatory Visit: Payer: Self-pay | Admitting: Family Medicine

## 2019-03-11 ENCOUNTER — Ambulatory Visit: Payer: Medicare Other | Attending: Internal Medicine

## 2019-03-11 DIAGNOSIS — Z23 Encounter for immunization: Secondary | ICD-10-CM | POA: Insufficient documentation

## 2019-03-11 NOTE — Progress Notes (Signed)
   Covid-19 Vaccination Clinic  Name:  Kristina [name]    MRN: [id number] DOB: 30-Apr-1931  03/11/2019  Kristina [name] was observed post Covid-19 immunization for 30 minutes based on pre-vaccination screening without incidence. She was provided with Vaccine Information Sheet and instruction to access the V-Safe system.   Kristina [name] was instructed to call 911 with any severe reactions post vaccine: Marland Kitchen Difficulty breathing  . Swelling of your face and throat  . A fast heartbeat  . A bad rash all over your body  . Dizziness and weakness    Immunizations Administered    Name Date Dose VIS Date Route   Pfizer COVID-19 Vaccine 03/11/2019  1:24 PM 0.3 mL 12/22/2018 Intramuscular   Manufacturer: [place]   Lot: [id number]   [place]: [id number]

## 2019-04-11 ENCOUNTER — Ambulatory Visit: Payer: Medicare Other | Attending: Internal Medicine

## 2019-04-11 DIAGNOSIS — Z23 Encounter for immunization: Secondary | ICD-10-CM

## 2019-04-11 NOTE — Progress Notes (Signed)
   Covid-19 Vaccination Clinic  Name:  Kristina [name]    MRN: [id number] DOB: May 31, 1931  04/11/2019  [name] was observed post Covid-19 immunization for 15 minutes without incident. She was provided with Vaccine Information Sheet and instruction to access the V-Safe system.   [name] was instructed to call 911 with any severe reactions post vaccine: Marland Kitchen Difficulty breathing  . Swelling of face and throat  . A fast heartbeat  . A bad rash all over body  . Dizziness and weakness   Immunizations Administered    Name Date Dose VIS Date Route   Pfizer COVID-19 Vaccine 04/11/2019  3:51 PM 0.3 mL 12/22/2018 Intramuscular   Manufacturer: [place]   Lot: [id number]   [place]: [id number]

## 2019-04-25 ENCOUNTER — Telehealth: Payer: Self-pay | Admitting: Family Medicine

## 2019-04-25 NOTE — Progress Notes (Signed)
  Chronic Care Management   Outreach Note  04/25/2019 Name: Kristina [name] MRN: [id number] DOB: Nov 22, 1931  Referred by: Midge Minium, MD Reason for referral : No chief complaint on file.   An unsuccessful telephone outreach was attempted today. The patient was referred to the pharmacist for assistance with care management and care coordination.   Follow Up Plan:   Earney Hamburg Upstream Scheduler

## 2019-05-04 ENCOUNTER — Telehealth: Payer: Self-pay | Admitting: Family Medicine

## 2019-05-04 NOTE — Progress Notes (Signed)
  Chronic Care Management   Outreach Note  05/04/2019 Name: Kristina [name] MRN: [id number] DOB: [birth year]  Referred by: Midge Minium, MD Reason for referral : No chief complaint on file.   An unsuccessful telephone outreach was attempted today. The patient was referred to the pharmacist for assistance with care management and care coordination.  This note is not being shared with the patient for the following reason: To respect privacy (The patient or proxy has requested that the information not be shared). Follow Up Plan:   Earney Hamburg Upstream Scheduler

## 2019-05-07 ENCOUNTER — Other Ambulatory Visit: Payer: Self-pay

## 2019-05-07 ENCOUNTER — Telehealth: Payer: Self-pay

## 2019-05-07 NOTE — Telephone Encounter (Signed)
  LAST APPOINTMENT DATE: 05/04/2019   NEXT APPOINTMENT DATE:@Visit  date not found  MEDICATION: atenolol (TENORMIN) 25 MG tablet  [id number] [place], [place] - [address] Phone:  [phone]  Fax:  [phone]       **Let patient know to contact pharmacy at the end of the day to make sure medication is ready. **  ** Please notify patient to allow 48-72 hours to process**  **Encourage patient to contact the pharmacy for refills or they can request refills through [facility]**  CLINICAL FILLS OUT ALL BELOW:   LAST REFILL:  QTY:  REFILL DATE:    OTHER COMMENTS:    Okay for refill?  Please advise

## 2019-05-08 ENCOUNTER — Other Ambulatory Visit: Payer: Self-pay | Admitting: Family Medicine

## 2019-05-08 DIAGNOSIS — E781 Pure hyperglyceridemia: Secondary | ICD-10-CM

## 2019-05-11 ENCOUNTER — Telehealth: Payer: Self-pay | Admitting: Family Medicine

## 2019-05-11 NOTE — Progress Notes (Signed)
  Chronic Care Management   Outreach Note  05/11/2019 Name: Kristina [name] MRN: [id number] DOB: 03-01-1931  Referred by: Midge Minium, MD Reason for referral : No chief complaint on file.   An unsuccessful telephone outreach was attempted today. The patient was referred to the pharmacist for assistance with care management and care coordination.   This note is not being shared with the patient for the following reason: To respect privacy (The patient or proxy has requested that the information not be shared).  Follow Up Plan:   Earney Hamburg Upstream Scheduler

## 2019-05-15 ENCOUNTER — Telehealth: Payer: Self-pay | Admitting: Family Medicine

## 2019-05-15 NOTE — Progress Notes (Signed)
°  Chronic Care Management   Note  05/15/2019 Name: Kristina [name] MRN: [id number] DOB: [birth year]  Kristina [name] is a 86 y.o. year old female who is a primary care patient of Birdie Riddle, Aundra Millet, MD. I reached out to [name] by phone today in response to a referral sent by [name]'s PCP, Midge Minium, MD.   [name] was given information about Chronic Care Management services today including:  1. CCM service includes personalized support from designated clinical staff supervised by her physician, including individualized plan of care and coordination with other care providers 2. 24/7 contact phone numbers for assistance for urgent and routine care needs. 3. Service will only be billed when office clinical staff spend 20 minutes or more in a month to coordinate care. 4. Only one practitioner may furnish and bill the service in a calendar month. 5. The patient may stop CCM services at any time (effective at the end of the month) by phone call to the office staff.   Patient agreed to services and verbal consent obtained.   This note is not being shared with the patient for the following reason: To respect privacy (The patient or proxy has requested that the information not be shared).  Follow up plan:   Earney Hamburg Upstream Scheduler

## 2019-05-15 NOTE — Progress Notes (Signed)
DAUGHTER WILL ASSIST WITH CCM CALL ON 06/27/19 AT 10:30.  This note is not being shared with the patient for the following reason: To respect privacy (The patient or proxy has requested that the information not be shared).   Earney Hamburg Upstream Scheduler

## 2019-06-02 NOTE — Assessment & Plan Note (Signed)
Chronic problem.  Check labs.  Adjust meds prn  

## 2019-06-02 NOTE — Assessment & Plan Note (Signed)
Worsening.  Pt not able to answer depression questions today and has some difficulty being conversational.  Following w/ Neuro.  On Namenda.  She is down 20 lbs which is consistent w/ the progression of the disease.

## 2019-06-02 NOTE — Assessment & Plan Note (Signed)
Check labs and replete prn. 

## 2019-06-02 NOTE — Assessment & Plan Note (Signed)
Chronic problem.  Adequate control.  Asymptomatic per pt report

## 2019-06-02 NOTE — Assessment & Plan Note (Signed)
Pt's PE unchanged from previous.  No longer doing colonoscopy or mammo.  Will get flu shot today.  Check labs.

## 2019-06-02 NOTE — Assessment & Plan Note (Signed)
Chronic problem, on Fenofibrate daily.  Check labs.  Adjust meds prn

## 2019-06-19 ENCOUNTER — Other Ambulatory Visit: Payer: Self-pay | Admitting: General Practice

## 2019-06-19 DIAGNOSIS — F0390 Unspecified dementia without behavioral disturbance: Secondary | ICD-10-CM

## 2019-06-19 DIAGNOSIS — I10 Essential (primary) hypertension: Secondary | ICD-10-CM

## 2019-06-19 DIAGNOSIS — E039 Hypothyroidism, unspecified: Secondary | ICD-10-CM

## 2019-06-19 DIAGNOSIS — E781 Pure hyperglyceridemia: Secondary | ICD-10-CM

## 2019-06-27 ENCOUNTER — Ambulatory Visit: Payer: Medicare Other

## 2019-06-27 ENCOUNTER — Telehealth: Payer: Self-pay

## 2019-06-27 DIAGNOSIS — F0390 Unspecified dementia without behavioral disturbance: Secondary | ICD-10-CM

## 2019-06-27 DIAGNOSIS — E781 Pure hyperglyceridemia: Secondary | ICD-10-CM

## 2019-06-27 DIAGNOSIS — I10 Essential (primary) hypertension: Secondary | ICD-10-CM

## 2019-06-27 NOTE — Progress Notes (Signed)
Chronic Care Management Pharmacy  Name: Kristina [name]  MRN: [id number] DOB: 02-26-1931   Chief Complaint/ HPI  Kristina [name],  86 y.o. , female presents for their Initial CCM visit with the clinical pharmacist via telephone due to COVID-19 Pandemic. Patient lives with daughter/caregiver and is accompanied by her today.   Daughter denies any major behavioral changes lately but has some concern leaving mother alone for extended periods of time. Daughter will be transitioning from home back to the office for work. Today she is seeking a referral for in-home care.   PCP : Midge Minium, MD  Chronic conditions include:  Encounter Diagnoses  Name Primary?  . Essential hypertension Yes  . Dementia without behavioral disturbance, unspecified dementia type ([place])   . Hypertriglyceridemia     Patient Active Problem List   Diagnosis Date Noted  . Weakness 07/15/2018  . Hypertriglyceridemia 02/07/2017  . Vitamin D deficiency 08/06/2016  . Physical exam 08/06/2016  . Abnormality of gait 04/23/2016  . Hypothyroidism 02/05/2016  . HTN (hypertension) 02/05/2016  . Dementia ([place]) 09/23/2015  . Paresthesia 01/28/2014  . Hyperreflexia 01/28/2014  . GERD 01/16/2008  . OTHER POSTOPERATIVE FUNCTIONAL DISORDERS 01/16/2008  . TUBULOVILLOUS ADENOMA, COLON, HX OF 01/10/2008   Past Surgical History:  Procedure Laterality Date  . CATARACT EXTRACTION    . CHOLECYSTECTOMY    . FOOT SURGERY     Right  . KNEE SURGERY     Right  . VAGINAL HYSTERECTOMY     Social History   Socioeconomic History  . Marital status: Married    Spouse name: Not on file  . Number of children: 3  . Years of education: 11  . Highest education level: Not on file  Occupational History  . Occupation: Retired  Tobacco Use  . Smoking status: Never Smoker  . Smokeless tobacco: Never Used  Vaping Use  . Vaping Use: Never used  Substance and Sexual Activity  . Alcohol use: No    Alcohol/week: 0.0  standard drinks  . Drug use: No  . Sexual activity: Not on file  Other Topics Concern  . Not on file  Social History Narrative   Lives with her daughter, Kristina [name].   Widowed.   Retired.   Right hand.   Three children.   High school education.   2 cups coffee daily.   Social Determinants of Health   Financial Resource Strain:   . Difficulty of Paying Living Expenses:   Food Insecurity: No Food Insecurity  . Worried About Charity fundraiser in the Last Year: Never true  . Ran Out of Food in the Last Year: Never true  Transportation Needs:   . Lack of Transportation (Medical):   Marland Kitchen Lack of Transportation (Non-Medical):   Physical Activity:   . Days of Exercise per Week:   . Minutes of Exercise per Session:   Stress:   . Feeling of Stress :   Social Connections:   . Frequency of Communication with Friends and Family:   . Frequency of Social Gatherings with Friends and Family:   . Attends Religious Services:   . Active Member of Clubs or Organizations:   . Attends Archivist Meetings:   Marland Kitchen Marital Status:    Family History  Problem Relation Age of Onset  . Dementia Mother   . Heart attack Father   . Heart disease Sister   . Breast cancer Sister 2  . Cancer Brother   . Colon cancer Neg  Hx    Allergies  Allergen Reactions  . Bee Venom Shortness Of Breath, Itching, Rash and Other (See Comments)    Almost passed out, also  . Penicillins Rash    Has patient had a PCN reaction causing immediate rash, facial/tongue/throat swelling, SOB or lightheadedness with hypotension: Yes Has patient had a PCN reaction causing severe rash involving mucus membranes or skin necrosis: No Has patient had a PCN reaction that required hospitalization: Unknown Has patient had a PCN reaction occurring within the last 10 years: No If all of the above answers are "NO", then may proceed with Cephalosporin use.    Outpatient Encounter Medications as of 06/27/2019  Medication Sig Note    . atenolol (TENORMIN) 25 MG tablet Take 1 tablet (25 mg total) by mouth daily.   . [facility] D3 50 MCG (2000 UT) CAPS TAKE 1 CAPSULE DAILY (Patient taking differently: Take 2,000 Units by mouth daily. )   . diphenhydrAMINE (BENADRYL) 25 MG tablet Take 2 tablets (50 mg total) by mouth every 6 (six) hours as needed for itching (or hives).   . EPINEPHrine 0.3 mg/0.3 mL IJ SOAJ injection Inject 0.3 mLs (0.3 mg total) into the muscle once as needed for up to 1 dose (severe allergic reaction).   . fenofibrate 160 MG tablet TAKE 1 TABLET BY MOUTH EVERY DAY   . fluticasone (FLONASE) 50 MCG/ACT nasal spray Place 2 sprays into both nostrils daily as needed (for seasonal allergies). SPRAY 2 SPRAYS INTO EACH NOSTRIL EVERY DAY   . levothyroxine (SYNTHROID) 75 MCG tablet TAKE 1 TABLET BY MOUTH EVERY DAY   . loperamide (IMODIUM) 2 MG capsule Take 2 mg by mouth at bedtime.  07/15/2018: Takes this every night  . memantine (NAMENDA) 10 MG tablet TAKE 1 TABLET BY MOUTH TWICE A DAY. (Patient taking differently: Take 10 mg by mouth 2 (two) times daily. )   . naproxen sodium (ANAPROX) 220 MG tablet Take 220 mg by mouth at bedtime.  07/15/2018: Takes this every night  . Polyethyl Glyc-Propyl Glyc PF (SYSTANE PRESERVATIVE FREE) 0.4-0.3 % SOLN Place 1-2 drops into both eyes at bedtime.   . triamcinolone ointment (KENALOG) 0.1 % Apply 1 application topically 2 (two) times daily as needed (as directed, to affected areas).    No facility-administered encounter medications on file as of 06/27/2019.   Patient Care Team    Relationship Specialty Notifications Start End  Midge Minium, MD PCP - General Family Medicine  02/05/16   Marcial Pacas, MD Consulting Physician Neurology  02/05/16   Ladene Artist, MD Consulting Physician Gastroenterology  02/05/16   Madelin Rear, [facility] Pharmacist Pharmacist  05/15/19    Comment: PHONE NUMBER [phone]   Current Diagnosis/Assessment: Goals Addressed            This Visit's Progress   .  PharmD Care Plan       CARE PLAN ENTRY  Current Barriers:  . Chronic Disease Management support, education, and care coordination needs related to Hypertension, Hyperlipidemia, and dementia   Hypertension  BP Readings from Last 3 Encounters:  11/07/18 132/84  07/17/18 (!) 131/52  03/14/18 120/64   . Pharmacist Clinical Goal(s): o Over the next 180 days, patient will work with PharmD and providers to maintain BP goal <140/90 . Current regimen:  o Atenolol 25 mg once daily  . Interventions: o Continue current management . Patient self care activities - Over the next 180 days, patient will: o Check BP at least once every 1-2  weeks, document, and provide at future appointments o Ensure daily salt intake < 2300 mg/day  Hyperlipidemia  Lab Results  Component Value Date/Time   LDLCALC 72 11/07/2018 11:47 AM   LDLCALC 72 03/14/2018 09:38 AM   LDLDIRECT 115.0 08/06/2016 10:34 AM   . Pharmacist Clinical Goal(s): o Over the next 180 days, patient will work with PharmD and providers to maintain LDL goal < 100, TG goal <200 . Current regimen:  o Diet alone for LDL lowering  o Fenofibrate 160 mg daily for triglyceride lowering . Interventions: o Continue current management . Patient self care activities - Over the next 180 days, patient will: o Continue current management Dementia . Pharmacist Clinical Goal(s) o Over the next 180 days, patient will work with PharmD and providers to support medication management and coordinated care related to dementia.  . Current regimen:  o Memantine 10 mg tablet - 1 tablet twice daily  . Interventions: o Referral for in-home nursing o Continue current medications . Patient self care activities - Over the next 180 days, patient will: o Please call me at [phone] if you have not heard back regarding referral after 2 weeks   Medication management . Pharmacist Clinical Goal(s): o Over the next 180 days, patient will work with PharmD and  providers to maintain optimal medication adherence . [facility] . Interventions o Comprehensive medication review performed. o Continue current medication management strategy . Patient self care activities - Over the next 180 days, patient will: o Take medications as prescribed o Report any questions or concerns to PharmD and/or provider(s)  Initial goal documentation.      Dementia   Patient has failed these meds in past: donepezil 10 mg daily. Denies any side effects with memantine. Patient is currently managed on: Marland Kitchen Memantine 10 mg tablet twice daily   Plan  Continue current medications.   Referral for in-home care.  Hypertension   BP Readings from Last 3 Encounters:  11/07/18 132/84  07/17/18 (!) 131/52  03/14/18 120/64   Patient checks BP at home infrequently. Routine BP monitoring encouraged. Denies dizziness, SOB, chest pain. Patient is currently controlled on the following medications:  . Atenolol 25 mg once daily   Plan  Continue current medications.  Monitor BP at home at least once every 1-2 weeks.   Hypertriglyceridemia   LDL goal <100  Lipid Panel     Component Value Date/Time   CHOL 142 11/07/2018 1147   TRIG 120.0 11/07/2018 1147   HDL 46.30 11/07/2018 1147   LDLCALC 72 11/07/2018 1147   LDLCALC 72 03/14/2018 0938   LDLDIRECT 115.0 08/06/2016 1034    Hepatic Function Latest Ref Rng & Units 11/07/2018 07/15/2018 03/23/2018  Total Protein 6.0 - 8.3 g/dL 6.6 6.7 -  Albumin 3.5 - 5.2 g/dL 3.7 3.2(L) 3.6  AST 0 - 37 U/L 23 70(H) -  ALT 0 - 35 U/L 10 19 -  Alk Phosphatase 39 - 117 U/L 25(L) 27(L) -  Total Bilirubin 0.2 - 1.2 mg/dL 0.5 1.4(H) -  Bilirubin, Direct 0.0 - 0.3 mg/dL 0.2 - -   TG well controlled on fenofibrate. Denies any muscle or abdominal pain or n/v. Patient is currently controlled on the following medications:  . Fenofibrate 160 mg daily   Plan  Hypothyroidism   Lab Results  Component Value Date/Time   TSH  1.43 11/07/2018 11:47 AM   TSH 2.05 07/26/2018 10:18 AM   TSH 0.162 (L) 07/15/2018 12:15 PM  TSH 1.30 03/14/2018 09:38 AM   Denies any issues with consistent administration. TSH well controlled on: . Levothyroxine 75 mcg  Plan  Continue current medications   GERD   Denies dysphagia, heartburn or nausea. Currently managed on diet alone.   Plan   Continue current management.  Vitamin D deficiency    Lab Results  Component Value Date   VD25OH 95.39 11/07/2018   VD25OH 61.51 09/16/2017   VD25OH 22.22 (L) 08/06/2016   VD25OH 24.6 (L) 09/23/2015   Vitamin d repleted. Currently taking:  Vitamin D3 2000 units daily   Plan  Continue current medications. Recommend vitamin d at annual visit.   Vaccines   Reviewed and discussed patient's vaccination history.    Immunization History  Administered Date(s) Administered  . Fluad Quad(high Dose 65+) 11/07/2018  . Influenza, High Dose Seasonal PF 10/22/2015, 10/14/2016, 09/16/2017  . Influenza,inj,Quad PF,6+ Mos 10/30/2014  . PFIZER SARS-COV-2 Vaccination 03/11/2019, 04/11/2019  . Pneumococcal Conjugate-13 11/05/2015  . Pneumococcal Polysaccharide-23 01/06/2001, 10/22/2015, 08/05/2016  . Td 03/12/2003  . Zoster Recombinat (Shingrix) 08/06/2016   Due for tdap.  Plan  Recommended patient receive tdap vaccine in pharmacy.  Medication Management   [facility]  [id number] [address] [place] [address]Phone: [phone]: [phone]  Denies any issues with current medication management.   Plan  Continue current medication management strategy.  Follow up: 4 month phone visit. ______________ Visit Information SDOH (Social Determinants of Health) assessments performed: Yes.  Ms. SVanderfordwas given information about Chronic Care Management services today including:  1. CCM service includes  personalized support from designated clinical staff supervised by her physician, including individualized plan of care and coordination with other care providers 2. 24/7 contact phone numbers for assistance for urgent and routine care needs. 3. Standard insurance, coinsurance, copays and deductibles apply for chronic care management only during months in which we provide at least 20 minutes of these services. Most insurances cover these services at 100%, however patients may be responsible for any copay, coinsurance and/or deductible if applicable. This service may help you avoid the need for more expensive face-to-face services. 4. Only one practitioner may furnish and bill the service in a calendar month. 5. The patient may stop CCM services at any time (effective at the end of the month) by phone call to the office staff.  Patient agreed to services and verbal consent obtained.   JMadelin Rear Pharm.D., BCGP Clinical Pharmacist [facility]([phone]

## 2019-06-27 NOTE — Patient Instructions (Addendum)
Please call me at [phone] (direct line) with any questions - thank you!  - Kristina [name]., Clinical Pharmacist  Goals Addressed            This Visit's Progress   . PharmD Care Plan       CARE PLAN ENTRY  Current Barriers:  . Chronic Disease Management support, education, and care coordination needs related to Hypertension, Hyperlipidemia, and dementia   Hypertension  BP Readings from Last 3 Encounters:  11/07/18 132/84  07/17/18 (!) 131/52  03/14/18 120/64   . Pharmacist Clinical Goal(s): o Over the next 180 days, patient will work with PharmD and providers to maintain BP goal <140/90 . Current regimen:  o Atenolol 25 mg once daily  . Interventions: o Continue current management . Patient self care activities - Over the next 180 days, patient will: o Check BP at least once every 1-2 weeks, document, and provide at future appointments o Ensure daily salt intake < 2300 mg/day  Hyperlipidemia  Lab Results  Component Value Date/Time   LDLCALC 72 11/07/2018 11:47 AM   LDLCALC 72 03/14/2018 09:38 AM   LDLDIRECT 115.0 08/06/2016 10:34 AM   . Pharmacist Clinical Goal(s): o Over the next 180 days, patient will work with PharmD and providers to maintain LDL goal < 100, TG goal <200 . Current regimen:  o Diet alone for LDL lowering  o Fenofibrate 160 mg daily for triglyceride lowering . Interventions: o Continue current management . Patient self care activities - Over the next 180 days, patient will: o Continue current management Dementia . Pharmacist Clinical Goal(s) o Over the next 180 days, patient will work with PharmD and providers to support medication management and coordinated care related to dementia.  . Current regimen:  o Memantine 10 mg tablet - 1 tablet twice daily  . Interventions: o Referral for in-home nursing o Continue current medications . Patient self care activities - Over the next 180 days, patient will: o Please call me at [phone] if you have  not heard back regarding referral after 2 weeks   Medication management . Pharmacist Clinical Goal(s): o Over the next 180 days, patient will work with PharmD and providers to maintain optimal medication adherence . Current pharmacy: [facility] . Interventions o Comprehensive medication review performed. o Continue current medication management strategy . Patient self care activities - Over the next 180 days, patient will: o Take medications as prescribed o Report any questions or concerns to PharmD and/or provider(s)  Initial goal documentation.      Kristina [name] was given information about Chronic Care Management services today including:  1. CCM service includes personalized support from designated clinical staff supervised by her physician, including individualized plan of care and coordination with other care providers 2. 24/7 contact phone numbers for assistance for urgent and routine care needs. 3. Standard insurance, coinsurance, copays and deductibles apply for chronic care management only during months in which we provide at least 20 minutes of these services. Most insurances cover these services at 100%, however patients may be responsible for any copay, coinsurance and/or deductible if applicable. This service may help you avoid the need for more expensive face-to-face services. 4. Only one practitioner may furnish and bill the service in a calendar month. 5. The patient may stop CCM services at any time (effective at the end of the month) by phone call to the office staff.  Patient agreed to services and verbal consent obtained.   The patient verbalized understanding  of instructions provided today and agreed to receive a mailed copy of patient instruction and/or educational materials. Telephone follow up appointment with pharmacy team member scheduled for: See next appointment with "Care Management Staff" under "What's Next" below.   Kristina [name], Pharm.D., BCGP Clinical  Pharmacist [facility] [phone]  Hypertension, Adult High blood pressure (hypertension) is when the force of blood pumping through the arteries is too strong. The arteries are the blood vessels that carry blood from the heart throughout the body. Hypertension forces the heart to work harder to pump blood and may cause arteries to become narrow or stiff. Untreated or uncontrolled hypertension can cause a heart attack, heart failure, a stroke, kidney disease, and other problems. A blood pressure reading consists of a higher number over a lower number. Ideally, your blood pressure should be below 120/80. The first ("top") number is called the systolic pressure. It is a measure of the pressure in your arteries as your heart beats. The second ("bottom") number is called the diastolic pressure. It is a measure of the pressure in your arteries as the heart relaxes. What are the causes? The exact cause of this condition is not known. There are some conditions that result in or are related to high blood pressure. What increases the risk? Some risk factors for high blood pressure are under your control. The following factors may make you more likely to develop this condition:  Smoking.  Having type 2 diabetes mellitus, high cholesterol, or both.  Not getting enough exercise or physical activity.  Being overweight.  Having too much fat, sugar, calories, or salt (sodium) in your diet.  Drinking too much alcohol. Some risk factors for high blood pressure may be difficult or impossible to change. Some of these factors include:  Having chronic kidney disease.  Having a family history of high blood pressure.  Age. Risk increases with age.  Race. You may be at higher risk if you are African American.  Gender. Men are at higher risk than women before age 41. After age 45, women are at higher risk than men.  Having obstructive sleep apnea.  Stress. What are  the signs or symptoms? High blood pressure may not cause symptoms. Very high blood pressure (hypertensive crisis) may cause:  Headache.  Anxiety.  Shortness of breath.  Nosebleed.  Nausea and vomiting.  Vision changes.  Severe chest pain.  Seizures. How is this diagnosed? This condition is diagnosed by measuring your blood pressure while you are seated, with your arm resting on a flat surface, your legs uncrossed, and your feet flat on the floor. The cuff of the blood pressure monitor will be placed directly against the skin of your upper arm at the level of your heart. It should be measured at least twice using the same arm. Certain conditions can cause a difference in blood pressure between your right and left arms. Certain factors can cause blood pressure readings to be lower or higher than normal for a short period of time:  When your blood pressure is higher when you are in a health care provider's office than when you are at home, this is called white coat hypertension. Most people with this condition do not need medicines.  When your blood pressure is higher at home than when you are in a health care provider's office, this is called masked hypertension. Most people with this condition may need medicines to control blood pressure. If you have a high blood pressure  reading during one visit or you have normal blood pressure with other risk factors, you may be asked to:  Return on a different day to have your blood pressure checked again.  Monitor your blood pressure at home for 1 week or longer. If you are diagnosed with hypertension, you may have other blood or imaging tests to help your health care provider understand your overall risk for other conditions. How is this treated? This condition is treated by making healthy lifestyle changes, such as eating healthy foods, exercising more, and reducing your alcohol intake. Your health care provider may prescribe medicine if  lifestyle changes are not enough to get your blood pressure under control, and if:  Your systolic blood pressure is above 130.  Your diastolic blood pressure is above 80. Your personal target blood pressure may vary depending on your medical conditions, your age, and other factors. Follow these instructions at home: Eating and drinking   Eat a diet that is high in fiber and potassium, and low in sodium, added sugar, and fat. An example eating plan is called the DASH (Dietary Approaches to Stop Hypertension) diet. To eat this way: ? Eat plenty of fresh fruits and vegetables. Try to fill one half of your plate at each meal with fruits and vegetables. ? Eat whole grains, such as whole-wheat pasta, brown rice, or whole-grain bread. Fill about one fourth of your plate with whole grains. ? Eat or drink low-fat dairy products, such as skim milk or low-fat yogurt. ? Avoid fatty cuts of meat, processed or cured meats, and poultry with skin. Fill about one fourth of your plate with lean proteins, such as fish, chicken without skin, beans, eggs, or tofu. ? Avoid pre-made and processed foods. These tend to be higher in sodium, added sugar, and fat.  Reduce your daily sodium intake. Most people with hypertension should eat less than 1,500 mg of sodium a day.  Do not drink alcohol if: ? Your health care provider tells you not to drink. ? You are pregnant, may be pregnant, or are planning to become pregnant.  If you drink alcohol: ? Limit how much you use to:  0-1 drink a day for women.  0-2 drinks a day for men. ? Be aware of how much alcohol is in your drink. In the [place] one drink equals one 12 oz bottle of beer (355 mL), one 5 oz glass of wine (148 mL), or one 1 oz glass of hard liquor (44 mL). Lifestyle   Work with your health care provider to maintain a healthy body weight or to lose weight. Ask what an ideal weight is for you.  Get at least 30 minutes of exercise most days of the week.  Activities may include walking, swimming, or biking.  Include exercise to strengthen your muscles (resistance exercise), such as Pilates or lifting weights, as part of your weekly exercise routine. Try to do these types of exercises for 30 minutes at least 3 days a week.  Do not use any products that contain nicotine or tobacco, such as cigarettes, e-cigarettes, and chewing tobacco. If you need help quitting, ask your health care provider.  Monitor your blood pressure at home as told by your health care provider.  Keep all follow-up visits as told by your health care provider. This is important. Medicines  Take over-the-counter and prescription medicines only as told by your health care provider. Follow directions carefully. Blood pressure medicines must be taken as prescribed.  Do not skip  doses of blood pressure medicine. Doing this puts you at risk for problems and can make the medicine less effective.  Ask your health care provider about side effects or reactions to medicines that you should watch for. Contact a health care provider if you:  Think you are having a reaction to a medicine you are taking.  Have headaches that keep coming back (recurring).  Feel dizzy.  Have swelling in your ankles.  Have trouble with your vision. Get help right away if you:  Develop a severe headache or confusion.  Have unusual weakness or numbness.  Feel faint.  Have severe pain in your chest or abdomen.  Vomit repeatedly.  Have trouble breathing. Summary  Hypertension is when the force of blood pumping through your arteries is too strong. If this condition is not controlled, it may put you at risk for serious complications.  Your personal target blood pressure may vary depending on your medical conditions, your age, and other factors. For most people, a normal blood pressure is less than 120/80.  Hypertension is treated with lifestyle changes, medicines, or a combination of both.  Lifestyle changes include losing weight, eating a healthy, low-sodium diet, exercising more, and limiting alcohol. This information is not intended to replace advice given to you by your health care provider. Make sure you discuss any questions you have with your health care provider. Document Revised: 09/07/2017 Document Reviewed: 09/07/2017 Elsevier Patient Education  [place].   Dementia Caregiver Guide Dementia is a term used to describe a number of symptoms that affect memory and thinking. The most common symptoms include:  Memory loss.  Trouble with language and communication.  Trouble concentrating.  Poor judgment.  Problems with reasoning.  Child-like behavior and language.  Extreme anxiety.  Angry outbursts.  Wandering from home or public places. Dementia usually gets worse slowly over time. In the early stages, people with dementia can stay independent and safe with some help. In later stages, they need help with daily tasks such as dressing, grooming, and using the bathroom. How to help the person with dementia cope Dementia can be frightening and confusing. Here are some tips to help the person with dementia cope with changes caused by the disease. General tips  Keep the person on track with his or her routine.  Try to identify areas where the person may need help.  Be supportive, patient, calm, and encouraging.  Gently remind the person that adjusting to changes takes time.  Help with the tasks that the person has asked for help with.  Keep the person involved in daily tasks and decisions as much as possible.  Encourage conversation, but try not to get frustrated or harried if the person struggles to find words or does not seem to appreciate your help. Communication tips  When the person is talking or seems frustrated, make eye contact and hold the person's hand.  Ask specific questions that need yes or no answers.  Use simple words, short  sentences, and a calm voice. Only give one direction at a time.  When offering choices, limit them to just 1 or 2.  Avoid correcting the person in a negative way.  If the person is struggling to find the right words, gently try to help him or her. How to recognize symptoms of stress Symptoms of stress in caregivers include:  Feeling frustrated or angry with the person with dementia.  Denying that the person has dementia or that his or her symptoms will not  improve.  Feeling hopeless and unappreciated.  Difficulty sleeping.  Difficulty concentrating.  Feeling anxious, irritable, or depressed.  Developing stress-related health problems.  Feeling like you have too little time for your own life. Follow these instructions at home:   Make sure that you and the person you are caring for: ? Get regular sleep. ? Exercise regularly. ? Eat regular, nutritious meals. ? Drink enough fluid to keep your urine clear or pale yellow. ? Take over-the-counter and prescription medicines only as told by your health care providers. ? Attend all scheduled health care appointments.  Join a support group with others who are caregivers.  Ask about respite care resources so that you can have a regular break from the stress of caregiving.  Look for signs of stress in yourself and in the person you are caring for. If you notice signs of stress, take steps to manage it.  Consider any safety risks and take steps to avoid them.  Organize medications in a pill box for each day of the week.  Create a plan to handle any legal or financial matters. Get legal or financial advice if needed.  Keep a calendar in a central location to remind the person of appointments or other activities. Tips for reducing the risk of injury  Keep floors clear of clutter. Remove rugs, magazine racks, and floor lamps.  Keep hallways well lit, especially at night.  Put a handrail and nonslip mat in the bathtub or  shower.  Put childproof locks on cabinets that contain dangerous items, such as medicines, alcohol, guns, toxic cleaning items, sharp tools or utensils, matches, and lighters.  Put the locks in places where the person cannot see or reach them easily. This will help ensure that the person does not wander out of the house and get lost.  Be prepared for emergencies. Keep a list of emergency phone numbers and addresses in a convenient area.  Remove car keys and lock garage doors so that the person does not try to get in the car and drive.  Have the person wear a bracelet that tracks locations and identifies the person as having memory problems. This should be worn at all times for safety. Where to find support: Many individuals and organizations offer support. These include:  Support groups for people with dementia and for caregivers.  Counselors or therapists.  Home health care services.  Adult day care centers. Where to find more information Alzheimer's Association: [employer] Contact a health care provider if:  The person's health is rapidly getting worse.  You are no longer able to care for the person.  Caring for the person is affecting your physical and emotional health.  The person threatens himself or herself, you, or anyone else. Summary  Dementia is a term used to describe a number of symptoms that affect memory and thinking.  Dementia usually gets worse slowly over time.  Take steps to reduce the person's risk of injury, and to plan for future care.  Caregivers need support, relief from caregiving, and time for their own lives. This information is not intended to replace advice given to you by your health care provider. Make sure you discuss any questions you have with your health care provider. Document Revised: 12/10/2016 Document Reviewed: 12/02/2015 Elsevier Patient Education  2020 [employer].

## 2019-06-27 NOTE — Telephone Encounter (Signed)
-----   Message from Midge Minium, MD sent at 06/27/2019  1:09 PM EDT ----- Unfortunately any home health or care management referral needs to have a face to face visit within 30 days and we are well outside that window.  She will need to schedule an in-office appt for Kristina [name] to review her needs and make referral  KT ----- Message ----- From: Madelin Rear, [facility] Sent: 06/27/2019   1:07 PM EDT To: Midge Minium, MD  Dr Birdie Riddle - please see brief visit summary below  Dementia -Daughter [name]) currently lives with and is the caregiver to patient. Daughter will be transitioning from home to the office for work and says mother will need support with routine care such as medication administration and meals.  -would you be able to give them a referral to home health please?  Thanks Edison Nasuti

## 2019-07-03 ENCOUNTER — Telehealth: Payer: Self-pay | Admitting: Family Medicine

## 2019-07-03 NOTE — Telephone Encounter (Signed)
Agree w/ advice given.  Will assess on Thursday

## 2019-07-03 NOTE — Telephone Encounter (Signed)
Called and spoke with pt daughter. She stated that mom is slowing down and is having a hard time with normal ADLs. I reviewed some of the normal occurrences with dementia and advised that if there are any mental status changes/Hallucinations/Changes to normal behavior, then she should be evaluated at the ER. Other than that she should be ok to wait until appt on Thursday.

## 2019-07-03 NOTE — Telephone Encounter (Signed)
Patient feels that her mother is not doing as well as usual.  She states she is having to help her more.  Patient has appointment for this Thursday, but daughter would like to know if there are specific things that she should look for to determine if her mother needs to be seen sooner.  Please Advise.

## 2019-07-05 ENCOUNTER — Telehealth: Payer: Self-pay | Admitting: Family Medicine

## 2019-07-05 ENCOUNTER — Encounter: Payer: Self-pay | Admitting: Family Medicine

## 2019-07-05 ENCOUNTER — Ambulatory Visit (INDEPENDENT_AMBULATORY_CARE_PROVIDER_SITE_OTHER): Payer: Medicare Other | Admitting: Family Medicine

## 2019-07-05 ENCOUNTER — Other Ambulatory Visit: Payer: Self-pay

## 2019-07-05 VITALS — BP 123/83 | HR 62 | Temp 98.0°F | Resp 17

## 2019-07-05 DIAGNOSIS — L89303 Pressure ulcer of unspecified buttock, stage 3: Secondary | ICD-10-CM

## 2019-07-05 DIAGNOSIS — E039 Hypothyroidism, unspecified: Secondary | ICD-10-CM | POA: Diagnosis not present

## 2019-07-05 DIAGNOSIS — E781 Pure hyperglyceridemia: Secondary | ICD-10-CM | POA: Diagnosis not present

## 2019-07-05 DIAGNOSIS — F0390 Unspecified dementia without behavioral disturbance: Secondary | ICD-10-CM | POA: Diagnosis not present

## 2019-07-05 DIAGNOSIS — I10 Essential (primary) hypertension: Secondary | ICD-10-CM | POA: Diagnosis not present

## 2019-07-05 LAB — HEPATIC FUNCTION PANEL
ALT: 10 U/L (ref 0–35)
AST: 19 U/L (ref 0–37)
Albumin: 3.6 g/dL (ref 3.5–5.2)
Alkaline Phosphatase: 23 U/L — ABNORMAL LOW (ref 39–117)
Bilirubin, Direct: 0.2 mg/dL (ref 0.0–0.3)
Total Bilirubin: 0.6 mg/dL (ref 0.2–1.2)
Total Protein: 7 g/dL (ref 6.0–8.3)

## 2019-07-05 LAB — LIPID PANEL
Cholesterol: 128 mg/dL (ref 0–200)
HDL: 34.1 mg/dL — ABNORMAL LOW (ref >39.00)
LDL Cholesterol: 67 mg/dL (ref 0–99)
NonHDL: 93.88
Total CHOL/HDL Ratio: 4
Triglycerides: 136 mg/dL (ref 0.0–149.0)
VLDL: 27.2 mg/dL (ref 0.0–40.0)

## 2019-07-05 LAB — CBC WITH DIFFERENTIAL/PLATELET
Basophils Absolute: 0.1 10*3/uL (ref 0.0–0.1)
Basophils Relative: 0.5 % (ref 0.0–3.0)
Eosinophils Absolute: 0 10*3/uL (ref 0.0–0.7)
Eosinophils Relative: 0.2 % (ref 0.0–5.0)
HCT: 40.1 % (ref 36.0–46.0)
Hemoglobin: 13.2 g/dL (ref 12.0–15.0)
Lymphocytes Relative: 15.5 % (ref 12.0–46.0)
Lymphs Abs: 1.9 10*3/uL (ref 0.7–4.0)
MCHC: 33.1 g/dL (ref 30.0–36.0)
MCV: 98 fl (ref 78.0–100.0)
Monocytes Absolute: 0.6 10*3/uL (ref 0.1–1.0)
Monocytes Relative: 4.8 % (ref 3.0–12.0)
Neutro Abs: 9.5 10*3/uL — ABNORMAL HIGH (ref 1.4–7.7)
Neutrophils Relative %: 79 % — ABNORMAL HIGH (ref 43.0–77.0)
Platelets: 205 10*3/uL (ref 150.0–400.0)
RBC: 4.09 Mil/uL (ref 3.87–5.11)
RDW: 14 % (ref 11.5–15.5)
WBC: 12 10*3/uL — ABNORMAL HIGH (ref 4.0–10.5)

## 2019-07-05 LAB — TSH: TSH: 1.41 u[IU]/mL (ref 0.35–4.50)

## 2019-07-05 NOTE — Telephone Encounter (Signed)
Kristin pt's Daughter called in asking if pt should continue taking the Memantine, Vit D supplement, Aleve, and a anti-diarrhea medication. Please advise.

## 2019-07-05 NOTE — Assessment & Plan Note (Signed)
Stop fenofibrate due to difficulty w/ swallowing.

## 2019-07-05 NOTE — Patient Instructions (Addendum)
We will have [facility] ([facility]) and [facility] reach out to you to get things put in place [facility] notify you of your lab results and make any changes if needed STOP the Atenolol and Fenofibrate CONTINUE the Levothyroxine daily Try and encourage eating and drinking but it is common for this to decline as her condition does Swallowing will become more difficult Call with any questions or concerns Hang in there!!!

## 2019-07-05 NOTE — Assessment & Plan Note (Signed)
Deteriorated.  Pt is now not able to get out of bed or chair on her own.  Not able to ambulate down the hall.  Having difficulty eating and drinking due to swallowing issues.  Now w/ pressure sores.  Pt is in need of placement long term and home health in the immediate future.  Ordered.  Daughter is Patent attorney.

## 2019-07-05 NOTE — Progress Notes (Signed)
   Subjective:    Patient ID: Kristina [name], female    DOB: 17-Jun-1931, 86 y.o.   MRN: [id number]  HPI HTN- chronic problem, on Atenolol 25mg  daily w/ good control.  Hypothyroid- chronic problem, on Levothyroxine 43mcg daily  Hypertriglyceridemia- chronic problem, on Fenofibrate 160mg  daily  Pressure sores- has 2 areas on her buttocks that pt's daughter is not able to 'get to heal up'.  Dementia- ongoing issue.  On Namenda BID per neuro.  Daughter reports things are 'changing really quickly'.  Pt will not get up on her own, gets confused going from bed to bathroom.  Eating less.  Difficulty taking pills.  Daughter reports film on her mouth/teeth and feels that she may be having an issue w/ swallowing.  Unable to walk unassisted.  Sleeping more.  Daughter is not able to take care of pt full time like she needs.   Review of Systems For ROS see HPI- pt not able to answer due to dementia.  This visit occurred during the SARS-CoV-2 public health emergency.  Safety protocols were in place, including screening questions prior to the visit, additional usage of staff PPE, and extensive cleaning of exam room while observing appropriate contact time as indicated for disinfecting solutions.       Objective:   Physical Exam Vitals reviewed.  Constitutional:      General: She is not in acute distress.    Appearance: She is well-developed.     Comments: Sitting in wheelchair  HENT:     Head: Normocephalic and atraumatic.  Eyes:     Conjunctiva/sclera: Conjunctivae normal.     Pupils: Pupils are equal, round, and reactive to light.  Neck:     Thyroid: No thyromegaly.  Cardiovascular:     Rate and Rhythm: Normal rate and regular rhythm.     Heart sounds: Normal heart sounds. No murmur heard.   Pulmonary:     Effort: Pulmonary effort is normal. No respiratory distress.     Breath sounds: Normal breath sounds.  Abdominal:     General: There is no distension.     Palpations: Abdomen is  soft.     Tenderness: There is no abdominal tenderness.  Musculoskeletal:     Cervical back: Normal range of motion and neck supple.     Right lower leg: No edema.     Left lower leg: No edema.  Lymphadenopathy:     Cervical: No cervical adenopathy.  Skin:    General: Skin is warm and dry.     Comments: 1 inch stage 3 pressure ulcers on both sides of gluteal cleft.  With clean bases.  No evidence of wound infection  Neurological:     Mental Status: She is alert. She is disoriented.     Motor: Weakness (unable to stand w/o total assistance) present.  Psychiatric:        Behavior: Behavior normal.           Assessment & Plan:

## 2019-07-05 NOTE — Telephone Encounter (Signed)
Continue the Namenda.  The other medications are optional at this time.  Aleve would be for pain as needed and diarrhea medication as needed.

## 2019-07-05 NOTE — Assessment & Plan Note (Signed)
New.  Pt w/ stage 3 pressures ulcers on both sides of gluteal cleft.  No evidence of infxn today.  Referral for [facility] wound care placed.

## 2019-07-05 NOTE — Telephone Encounter (Signed)
Please advise 

## 2019-07-05 NOTE — Assessment & Plan Note (Signed)
Chronic problem.  Well controlled.  Will stop Atenolol due to difficulty taking pills.

## 2019-07-05 NOTE — Telephone Encounter (Signed)
Patient daughter notified of PCP recommendations and is agreement and expresses an understanding.    

## 2019-07-05 NOTE — Assessment & Plan Note (Signed)
Chronic problem.  Check labs.  Adjust meds prn.  Will need to continue this and may need to visit alternative routes of administration

## 2019-07-06 ENCOUNTER — Other Ambulatory Visit: Payer: Self-pay | Admitting: General Practice

## 2019-07-06 DIAGNOSIS — E039 Hypothyroidism, unspecified: Secondary | ICD-10-CM | POA: Diagnosis not present

## 2019-07-06 DIAGNOSIS — E781 Pure hyperglyceridemia: Secondary | ICD-10-CM | POA: Diagnosis not present

## 2019-07-06 DIAGNOSIS — L89313 Pressure ulcer of right buttock, stage 3: Secondary | ICD-10-CM | POA: Diagnosis not present

## 2019-07-06 DIAGNOSIS — R7989 Other specified abnormal findings of blood chemistry: Secondary | ICD-10-CM

## 2019-07-06 DIAGNOSIS — I10 Essential (primary) hypertension: Secondary | ICD-10-CM | POA: Diagnosis not present

## 2019-07-06 DIAGNOSIS — L89323 Pressure ulcer of left buttock, stage 3: Secondary | ICD-10-CM | POA: Diagnosis not present

## 2019-07-06 DIAGNOSIS — F0280 Dementia in other diseases classified elsewhere without behavioral disturbance: Secondary | ICD-10-CM | POA: Diagnosis not present

## 2019-07-06 LAB — BASIC METABOLIC PANEL
BUN: 63 mg/dL — ABNORMAL HIGH (ref 6–23)
CO2: 29 mEq/L (ref 19–32)
Calcium: 9.1 mg/dL (ref 8.4–10.5)
Chloride: 110 mEq/L (ref 96–112)
Creatinine, Ser: 2.27 mg/dL — ABNORMAL HIGH (ref 0.40–1.20)
GFR: 20.43 mL/min — ABNORMAL LOW (ref >60.00)
Glucose, Bld: 159 mg/dL — ABNORMAL HIGH (ref 70–99)
Potassium: 4.3 mEq/L (ref 3.5–5.1)
Sodium: 151 mEq/L — ABNORMAL HIGH (ref 135–145)

## 2019-07-09 ENCOUNTER — Encounter (HOSPITAL_COMMUNITY): Payer: Self-pay

## 2019-07-09 ENCOUNTER — Telehealth: Payer: Self-pay | Admitting: Family Medicine

## 2019-07-09 ENCOUNTER — Other Ambulatory Visit: Payer: Self-pay

## 2019-07-09 ENCOUNTER — Inpatient Hospital Stay (HOSPITAL_COMMUNITY)
Admission: EM | Admit: 2019-07-09 | Discharge: 2019-07-12 | DRG: 641 | Disposition: A | Discharge status: Skilled Nursing Facility | Payer: Medicare Other | Attending: Internal Medicine | Admitting: Internal Medicine

## 2019-07-09 DIAGNOSIS — R410 Disorientation, unspecified: Secondary | ICD-10-CM | POA: Diagnosis not present

## 2019-07-09 DIAGNOSIS — R5381 Other malaise: Secondary | ICD-10-CM | POA: Diagnosis not present

## 2019-07-09 DIAGNOSIS — R2681 Unsteadiness on feet: Secondary | ICD-10-CM | POA: Diagnosis not present

## 2019-07-09 DIAGNOSIS — E870 Hyperosmolality and hypernatremia: Secondary | ICD-10-CM | POA: Diagnosis present

## 2019-07-09 DIAGNOSIS — E860 Dehydration: Principal | ICD-10-CM | POA: Diagnosis present

## 2019-07-09 DIAGNOSIS — L89312 Pressure ulcer of right buttock, stage 2: Secondary | ICD-10-CM | POA: Diagnosis present

## 2019-07-09 DIAGNOSIS — E039 Hypothyroidism, unspecified: Secondary | ICD-10-CM | POA: Diagnosis not present

## 2019-07-09 DIAGNOSIS — Z7401 Bed confinement status: Secondary | ICD-10-CM | POA: Diagnosis not present

## 2019-07-09 DIAGNOSIS — Z79899 Other long term (current) drug therapy: Secondary | ICD-10-CM

## 2019-07-09 DIAGNOSIS — R531 Weakness: Secondary | ICD-10-CM | POA: Diagnosis not present

## 2019-07-09 DIAGNOSIS — Z8249 Family history of ischemic heart disease and other diseases of the circulatory system: Secondary | ICD-10-CM | POA: Diagnosis not present

## 2019-07-09 DIAGNOSIS — Z66 Do not resuscitate: Secondary | ICD-10-CM | POA: Diagnosis present

## 2019-07-09 DIAGNOSIS — E785 Hyperlipidemia, unspecified: Secondary | ICD-10-CM | POA: Diagnosis present

## 2019-07-09 DIAGNOSIS — I951 Orthostatic hypotension: Secondary | ICD-10-CM | POA: Diagnosis not present

## 2019-07-09 DIAGNOSIS — R1312 Dysphagia, oropharyngeal phase: Secondary | ICD-10-CM | POA: Diagnosis not present

## 2019-07-09 DIAGNOSIS — Z79890 Hormone replacement therapy (postmenopausal): Secondary | ICD-10-CM | POA: Diagnosis not present

## 2019-07-09 DIAGNOSIS — R55 Syncope and collapse: Secondary | ICD-10-CM | POA: Diagnosis not present

## 2019-07-09 DIAGNOSIS — Z791 Long term (current) use of non-steroidal anti-inflammatories (NSAID): Secondary | ICD-10-CM

## 2019-07-09 DIAGNOSIS — I129 Hypertensive chronic kidney disease with stage 1 through stage 4 chronic kidney disease, or unspecified chronic kidney disease: Secondary | ICD-10-CM | POA: Diagnosis not present

## 2019-07-09 DIAGNOSIS — M2550 Pain in unspecified joint: Secondary | ICD-10-CM | POA: Diagnosis not present

## 2019-07-09 DIAGNOSIS — Z8542 Personal history of malignant neoplasm of other parts of uterus: Secondary | ICD-10-CM | POA: Diagnosis not present

## 2019-07-09 DIAGNOSIS — Z803 Family history of malignant neoplasm of breast: Secondary | ICD-10-CM

## 2019-07-09 DIAGNOSIS — D539 Nutritional anemia, unspecified: Secondary | ICD-10-CM

## 2019-07-09 DIAGNOSIS — Z8711 Personal history of peptic ulcer disease: Secondary | ICD-10-CM

## 2019-07-09 DIAGNOSIS — Z8719 Personal history of other diseases of the digestive system: Secondary | ICD-10-CM

## 2019-07-09 DIAGNOSIS — M6281 Muscle weakness (generalized): Secondary | ICD-10-CM | POA: Diagnosis not present

## 2019-07-09 DIAGNOSIS — R41841 Cognitive communication deficit: Secondary | ICD-10-CM | POA: Diagnosis not present

## 2019-07-09 DIAGNOSIS — Z90710 Acquired absence of both cervix and uterus: Secondary | ICD-10-CM

## 2019-07-09 DIAGNOSIS — L8990 Pressure ulcer of unspecified site, unspecified stage: Secondary | ICD-10-CM | POA: Insufficient documentation

## 2019-07-09 DIAGNOSIS — L89322 Pressure ulcer of left buttock, stage 2: Secondary | ICD-10-CM | POA: Diagnosis not present

## 2019-07-09 DIAGNOSIS — N1832 Chronic kidney disease, stage 3b: Secondary | ICD-10-CM | POA: Diagnosis not present

## 2019-07-09 DIAGNOSIS — R4020 Unspecified coma: Secondary | ICD-10-CM | POA: Diagnosis not present

## 2019-07-09 DIAGNOSIS — D696 Thrombocytopenia, unspecified: Secondary | ICD-10-CM

## 2019-07-09 DIAGNOSIS — F0390 Unspecified dementia without behavioral disturbance: Secondary | ICD-10-CM | POA: Diagnosis not present

## 2019-07-09 DIAGNOSIS — I959 Hypotension, unspecified: Secondary | ICD-10-CM | POA: Diagnosis not present

## 2019-07-09 DIAGNOSIS — R2689 Other abnormalities of gait and mobility: Secondary | ICD-10-CM | POA: Diagnosis not present

## 2019-07-09 DIAGNOSIS — N179 Acute kidney failure, unspecified: Secondary | ICD-10-CM | POA: Diagnosis not present

## 2019-07-09 DIAGNOSIS — Z20822 Contact with and (suspected) exposure to covid-19: Secondary | ICD-10-CM | POA: Diagnosis not present

## 2019-07-09 DIAGNOSIS — M6258 Muscle wasting and atrophy, not elsewhere classified, other site: Secondary | ICD-10-CM | POA: Diagnosis not present

## 2019-07-09 DIAGNOSIS — I10 Essential (primary) hypertension: Secondary | ICD-10-CM | POA: Diagnosis not present

## 2019-07-09 LAB — BASIC METABOLIC PANEL
Anion gap: 10 (ref 5–15)
Anion gap: 10 (ref 5–15)
BUN: 56 mg/dL — ABNORMAL HIGH (ref 8–23)
BUN: 49 mg/dL — ABNORMAL HIGH (ref 8–23)
CO2: 25 mmol/L (ref 22–32)
CO2: 22 mmol/L (ref 22–32)
Calcium: 8.7 mg/dL — ABNORMAL LOW (ref 8.9–10.3)
Calcium: 8 mg/dL — ABNORMAL LOW (ref 8.9–10.3)
Chloride: 112 mmol/L — ABNORMAL HIGH (ref 98–111)
Chloride: 114 mmol/L — ABNORMAL HIGH (ref 98–111)
Creatinine, Ser: 1.94 mg/dL — ABNORMAL HIGH (ref 0.44–1.00)
Creatinine, Ser: 1.63 mg/dL — ABNORMAL HIGH (ref 0.44–1.00)
GFR calc Af Amer: 27 mL/min — ABNORMAL LOW (ref >60)
GFR calc Af Amer: 33 mL/min — ABNORMAL LOW (ref >60)
GFR calc non Af Amer: 23 mL/min — ABNORMAL LOW (ref >60)
GFR calc non Af Amer: 28 mL/min — ABNORMAL LOW (ref >60)
Glucose, Bld: 120 mg/dL — ABNORMAL HIGH (ref 70–99)
Glucose, Bld: 129 mg/dL — ABNORMAL HIGH (ref 70–99)
Potassium: 3.8 mmol/L (ref 3.5–5.1)
Potassium: 4 mmol/L (ref 3.5–5.1)
Sodium: 147 mmol/L — ABNORMAL HIGH (ref 135–145)
Sodium: 146 mmol/L — ABNORMAL HIGH (ref 135–145)

## 2019-07-09 LAB — CBC WITH DIFFERENTIAL/PLATELET
Abs Immature Granulocytes: 0.08 10*3/uL — ABNORMAL HIGH (ref 0.00–0.07)
Basophils Absolute: 0.1 10*3/uL (ref 0.0–0.1)
Basophils Relative: 1 %
Eosinophils Absolute: 0.1 10*3/uL (ref 0.0–0.5)
Eosinophils Relative: 1 %
HCT: 37.8 % (ref 36.0–46.0)
Hemoglobin: 11.9 g/dL — ABNORMAL LOW (ref 12.0–15.0)
Immature Granulocytes: 1 %
Lymphocytes Relative: 16 %
Lymphs Abs: 1.7 10*3/uL (ref 0.7–4.0)
MCH: 31.7 pg (ref 26.0–34.0)
MCHC: 31.5 g/dL (ref 30.0–36.0)
MCV: 100.8 fL — ABNORMAL HIGH (ref 80.0–100.0)
Monocytes Absolute: 0.5 10*3/uL (ref 0.1–1.0)
Monocytes Relative: 5 %
Neutro Abs: 8.1 10*3/uL — ABNORMAL HIGH (ref 1.7–7.7)
Neutrophils Relative %: 76 %
Platelets: 136 10*3/uL — ABNORMAL LOW (ref 150–400)
RBC: 3.75 MIL/uL — ABNORMAL LOW (ref 3.87–5.11)
RDW: 14.2 % (ref 11.5–15.5)
WBC: 10.5 10*3/uL (ref 4.0–10.5)
nRBC: 0 % (ref 0.0–0.2)

## 2019-07-09 LAB — URINALYSIS, ROUTINE W REFLEX MICROSCOPIC
Bilirubin Urine: NEGATIVE
Glucose, UA: NEGATIVE mg/dL
Hgb urine dipstick: NEGATIVE
Ketones, ur: NEGATIVE mg/dL
Leukocytes,Ua: NEGATIVE
Nitrite: NEGATIVE
Protein, ur: NEGATIVE mg/dL
Specific Gravity, Urine: 1.025 (ref 1.005–1.030)
pH: 5 (ref 5.0–8.0)

## 2019-07-09 LAB — SARS CORONAVIRUS 2 BY RT PCR (HOSPITAL ORDER, PERFORMED IN ~~LOC~~ HOSPITAL LAB): SARS Coronavirus 2: NEGATIVE

## 2019-07-09 LAB — VITAMIN B12: Vitamin B-12: 727 pg/mL (ref 180–914)

## 2019-07-09 LAB — LACTIC ACID, PLASMA: Lactic Acid, Venous: 1.4 mmol/L (ref 0.5–1.9)

## 2019-07-09 LAB — TSH: TSH: 2.731 u[IU]/mL (ref 0.350–4.500)

## 2019-07-09 LAB — MAGNESIUM: Magnesium: 2.3 mg/dL (ref 1.7–2.4)

## 2019-07-09 LAB — FOLATE: Folate: 15.9 ng/mL (ref >5.9)

## 2019-07-09 LAB — PHOSPHORUS: Phosphorus: 3.5 mg/dL (ref 2.5–4.6)

## 2019-07-09 MED ORDER — ENOXAPARIN SODIUM 40 MG/0.4ML ~~LOC~~ SOLN: 40.0000 mg | SUBCUTANEOUS | Status: DC

## 2019-07-09 MED ORDER — ACETAMINOPHEN 650 MG RE SUPP: 650.0000 mg | Freq: Four times a day (QID) | RECTAL | Status: DC | PRN

## 2019-07-09 MED ORDER — SODIUM CHLORIDE 0.9 % IV BOLUS
1000.0000 mL | Freq: Once | INTRAVENOUS | Status: AC
  Administered 2019-07-09: 1000 mL via INTRAVENOUS

## 2019-07-09 MED ORDER — ONDANSETRON HCL 4 MG/2ML IJ SOLN: 4.0000 mg | Freq: Four times a day (QID) | INTRAMUSCULAR | Status: DC | PRN

## 2019-07-09 MED ORDER — LEVOTHYROXINE SODIUM 75 MCG PO TABS: 75.0000 ug | ORAL_TABLET | Freq: Every day | ORAL | Status: DC

## 2019-07-09 MED ORDER — ONDANSETRON HCL 4 MG PO TABS: 4.0000 mg | ORAL_TABLET | Freq: Four times a day (QID) | ORAL | Status: DC | PRN

## 2019-07-09 MED ORDER — SODIUM CHLORIDE 0.9 % IV SOLN: INTRAVENOUS | Status: DC

## 2019-07-09 MED ORDER — ACETAMINOPHEN 325 MG PO TABS: 650.0000 mg | ORAL_TABLET | Freq: Four times a day (QID) | ORAL | Status: DC | PRN

## 2019-07-09 MED ORDER — LEVOTHYROXINE SODIUM 75 MCG PO TABS
75.0000 ug | ORAL_TABLET | Freq: Every day | ORAL | Status: DC
  Administered 2019-07-10 – 2019-07-12 (×3): 75 ug via ORAL
  Filled 2019-07-09 (×3): qty 1

## 2019-07-09 MED ORDER — MEMANTINE HCL 10 MG PO TABS
10.0000 mg | ORAL_TABLET | Freq: Two times a day (BID) | ORAL | Status: DC
  Administered 2019-07-10 – 2019-07-12 (×5): 10 mg via ORAL
  Filled 2019-07-09 (×6): qty 1

## 2019-07-09 NOTE — ED Notes (Signed)
Levada Dy, Phlebotomy was unable to draw the blood specimens required. Lattie Haw, Phlebotomy was made aware and will attempt.

## 2019-07-09 NOTE — H&P (Addendum)
History and Physical    Kristina [name] IDP:[id number] DOB: 1931-03-04 DOA: 07/09/2019  PCP: Midge Minium, MD  Patient coming from: Home  I have personally briefly reviewed patient's old medical records in [place]  Chief Complaint: Near syncope  HPI: Kristina [name] is a 86 y.o. female with medical history significant of hypertension, hyperlipidemia, hypothyroidism, dementia presents to emergency department for the evaluation of near syncope episode.  Patient's daughter at the bedside is the historian.  She tells me that this morning patient was assisted to the commode where she sat down for a few minutes and blacked out/became unresponsive for 2 to 3 minutes.  She was slowly looking around the room.  Was not responsive to verbal stimuli or when her daughter waving her hand in front of her face.  Eventually patient returned to her baseline however patient's daughter called EMS and brought patient to the emergency department for further evaluation and management.  Patient's daughter reports that since couple of days patient has not been eating/drinking and unable to ambulate independently.  She tells me that since couple of days patient has difficulty swallowing her pills.  Patient was seen by her PCP on Thursday where she had basic labs done and was found to be dehydrated.  PCP put orders for home health assistance.  No history of head trauma, seizures, slurred speech, headache, blurry vision, chest pain, shortness of breath, lightheadedness, dizziness, palpitation, leg swelling, fever, chills, nausea, vomiting, diarrhea, urinary symptoms.  At baseline: She only recognizes her family member.  No history of smoking, alcohol, listed drug use.  ED Course: Upon arrival to ED: Patient's blood pressure was on lower side.  Afebrile with no leukocytosis.  CBC shows macrocytic anemia.  Platelet: 136, UA negative for infection, sodium 147.  CMP shows worsening kidney function.  UA  negative for infection.  Patient was given IV fluid boluses.  PT was consulted however during PT evaluation patient became dizzy and her blood pressure dropped upon standing.  PT recommended SNF placement.  [facility] hospitalist consulted for admission for dizziness secondary to orthostatic hypotension.  Review of Systems: As per HPI otherwise negative.    Past Medical History:  Diagnosis Date  . Benign paroxysmal positional vertigo   . Diverticulosis   . Duodenal ulcer   . Endometrial cancer [facility])    age 29  . GERD (gastroesophageal reflux disease)   . Hypertension   . Hypothyroidism   . Memory loss   . Peripheral neuropathy   . 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 CATARACT EXTRACTION    . CHOLECYSTECTOMY    . FOOT SURGERY     Right  . KNEE SURGERY     Right  . VAGINAL HYSTERECTOMY       reports that she has never smoked. She has never used smokeless tobacco. She reports that she does not drink alcohol and does not use drugs.  Allergies  Allergen Reactions  . Bee Venom Shortness Of Breath, Itching, Rash and Other (See Comments)    Almost passed out, also  . Penicillins Rash    Has patient had a PCN reaction causing immediate rash, facial/tongue/throat swelling, SOB or lightheadedness with hypotension: Yes Has patient had a PCN reaction causing severe rash involving mucus membranes or skin necrosis: No Has patient had a PCN reaction that required hospitalization: Unknown Has patient had a PCN reaction occurring within the last 10 years: No  If all of the above answers are "NO", then may proceed with Cephalosporin use.     Family History  Problem Relation Age of Onset  . Dementia Mother   . Heart attack Father   . Heart disease Sister   . Breast cancer Sister 21  . Cancer Brother   . Colon cancer Neg Hx     Prior to Admission medications   Medication Sig Start Date End Date Taking? Authorizing Provider    [facility] D3 50 MCG (2000 UT) CAPS TAKE 1 CAPSULE DAILY Patient taking differently: Take 2,000 Units by mouth daily.  01/02/18   Midge Minium, MD  diphenhydrAMINE (BENADRYL) 25 MG tablet Take 2 tablets (50 mg total) by mouth every 6 (six) hours as needed for itching (or hives). 06/14/16   Forde Dandy, MD  EPINEPHrine 0.3 mg/0.3 mL IJ SOAJ injection Inject 0.3 mLs (0.3 mg total) into the muscle once as needed for up to 1 dose (severe allergic reaction). 01/26/19   Midge Minium, MD  fluticasone (FLONASE) 50 MCG/ACT nasal spray Place 2 sprays into both nostrils daily as needed (for seasonal allergies). SPRAY 2 SPRAYS INTO EACH NOSTRIL EVERY DAY 07/17/18   Geradine Girt, DO  levothyroxine (SYNTHROID) 75 MCG tablet TAKE 1 TABLET BY MOUTH EVERY DAY 03/07/19   Midge Minium, MD  loperamide (IMODIUM) 2 MG capsule Take 2 mg by mouth at bedtime.     [provider]  memantine (NAMENDA) 10 MG tablet TAKE 1 TABLET BY MOUTH TWICE A DAY. Patient taking differently: Take 10 mg by mouth 2 (two) times daily.  06/08/18   Marcial Pacas, MD  naproxen sodium (ANAPROX) 220 MG tablet Take 220 mg by mouth at bedtime.     [provider]  Polyethyl Glyc-Propyl Glyc PF (SYSTANE PRESERVATIVE FREE) 0.4-0.3 % SOLN Place 1-2 drops into both eyes at bedtime.    [provider]  triamcinolone ointment (KENALOG) 0.1 % Apply 1 application topically 2 (two) times daily as needed (as directed, to affected areas). 07/19/18   Midge Minium, MD    Physical Exam: Vitals:   07/09/19 1330 07/09/19 1400 07/09/19 1430 07/09/19 1632  BP: (!) 103/51 (!) 92/45 (!) 101/51 129/66  Pulse: 67 69 69 67  Resp: 14 11 14 14   Temp:      TempSrc:      SpO2:  92% 97% 97%    Constitutional: NAD, calm, comfortable, appears dehydrated, alert, oriented to person only.  On room air, appears very dehydrated Eyes: PERRL, lids and conjunctivae normal ENMT: Mucous membranes are dry. Posterior pharynx clear of any  exudate or lesions.Normal dentition.  Neck: normal, supple, no masses, no thyromegaly Respiratory: clear to auscultation bilaterally, no wheezing, no crackles. Normal respiratory effort. No accessory muscle use.  Cardiovascular: Regular rate and rhythm, no murmurs / rubs / gallops. No extremity edema. 2+ pedal pulses. No carotid bruits.  Abdomen: no tenderness, no masses palpated. No hepatosplenomegaly. Bowel sounds positive.  Musculoskeletal: no clubbing / cyanosis. No joint deformity upper and lower extremities. Good ROM, no contractures. Normal muscle tone.  Skin: no rashes, lesions, ulcers. No induration Neurologic: CN 2-12 grossly intact. Sensation intact, DTR normal. Strength 5/5 in all 4.  Psychiatric: Normal judgment and insight. Alert and oriented x 3. Normal mood.    Labs on Admission: I have personally reviewed following labs and imaging studies  CBC: Recent Labs  Lab 07/05/19 0923 07/09/19 1220  WBC 12.0* 10.5  NEUTROABS 9.5* 8.1*  HGB 13.2 11.9*  HCT 40.1 37.8  MCV 98.0 100.8*  PLT 205.0 564*   Basic Metabolic Panel: Recent Labs  Lab 07/05/19 0923 07/09/19 1220  NA 151 Repeated and verified X2.* 147*  K 4.3 3.8  CL 110 112*  CO2 29 25  GLUCOSE 159* 120*  BUN 63* 56*  CREATININE 2.27* 1.94*  CALCIUM 9.1 8.7*   GFR: Iron leg skills CrCl cannot be calculated (Unknown ideal weight.). Liver Function Tests: Recent Labs  Lab 07/05/19 0923  AST 19  ALT 10  ALKPHOS 23*  BILITOT 0.6  PROT 7.0  ALBUMIN 3.6   No results for input(s): LIPASE, AMYLASE in the last 168 hours. No results for input(s): AMMONIA in the last 168 hours. Coagulation Profile: No results for input(s): INR, PROTIME in the last 168 hours. Cardiac Enzymes: No results for input(s): CKTOTAL, CKMB, CKMBINDEX, TROPONINI in the last 168 hours. BNP (last 3 results) No results for input(s): PROBNP in the last 8760 hours. HbA1C: No results for input(s): HGBA1C in the last 72 hours. CBG: No  results for input(s): GLUCAP in the last 168 hours. Lipid Profile: No results for input(s): CHOL, HDL, LDLCALC, TRIG, CHOLHDL, LDLDIRECT in the last 72 hours. Thyroid Function Tests: No results for input(s): TSH, T4TOTAL, FREET4, T3FREE, THYROIDAB in the last 72 hours. Anemia Panel: No results for input(s): VITAMINB12, FOLATE, FERRITIN, TIBC, IRON, RETICCTPCT in the last 72 hours. Urine analysis:    Component Value Date/Time   COLORURINE YELLOW 07/09/2019 1444   APPEARANCEUR HAZY (A) 07/09/2019 1444   LABSPEC 1.025 07/09/2019 1444   PHURINE 5.0 07/09/2019 1444   GLUCOSEU NEGATIVE 07/09/2019 1444   HGBUR NEGATIVE 07/09/2019 1444   BILIRUBINUR NEGATIVE 07/09/2019 1444   KETONESUR NEGATIVE 07/09/2019 1444   PROTEINUR NEGATIVE 07/09/2019 1444   NITRITE NEGATIVE 07/09/2019 1444   LEUKOCYTESUR NEGATIVE 07/09/2019 1444    Radiological Exams on Admission: No results found.  EKG: Independently reviewed.  Sinus rhythm, right bundle branch block.  No ST elevation or depression noted.  Assessment/Plan Principal Problem:   Orthostatic hypotension Active Problems:   Dementia (HCC)   Hypothyroidism   Acute on chronic kidney failure (HCC)   Macrocytic anemia   Thrombocytopenia (HCC)   Near syncope/syncope: -Secondary to orthostatic hypotension likely secondary to decreased p.o. intake.  Orthostatic vitals positive in ED.  Patient received IV fluid boluses in ED.  No head trauma, no neuro deficit noted on exam-patient is at baseline.  Will hold off imaging for now. -Patient afebrile with no leukocytosis.  UA negative for infection. -Admit patient on the floor.  On med telemetry bed. -Continue IV fluids.  Monitor vitals closely. -We will obtain transthoracic echo, check TSH, lactic acid -PT/OT consult -On fall precautions  AKI on CKD: -Baseline creatinine: 1.27, GFR: 40.  Today's creatinine is 1.94, GFR: 23- -continue IV fluids.  Monitor kidney function closely -Avoid nephrotoxic  medication.  Hypothyroidism: Check TSH -Continue levothyroxine  Dementia: Continue Namenda  Thrombocytopenia: Platelet: 136 -No signs of active bleeding.  Continue to monitor  Macrocytic anemia: -H&H: 11.9/37.8.  MCV: 100.8 -Check TSH, B12, folate. -Monitor H&H closely  Hypernatremia: Sodium 147 -Likely secondary to dehydration. -Repeat BMP tomorrow a.m.  DVT prophylaxis: Lovenox/SCD Code Status: DNR-confirmed with patient's daughter Family Communication: Patient's daughter present at bedside.  Plan of care discussed with patient and her daughter at bedside in length and they verbalized understanding and agreed with it. Disposition Plan: Likely home in 2 to 3 days Consults called: Case manager Admission status:  Inpatient   Mckinley Jewel MD [facility]  If 7PM-7AM, please contact night-coverage [email or web address] Password [facility]  07/09/2019, 5:48 PM

## 2019-07-09 NOTE — Evaluation (Signed)
Physical Therapy Evaluation Patient Details Name: Kristina [name] MRN: [id number] DOB: [birth year] Today's Date: 07/09/2019   History of Present Illness  Pt is an 86 y/o female admitted secondary to syncopal episode. PMH includes dementia and HTN.   Clinical Impression  Pt admitted secondary to problem above with deficits below. Pt requiring min-mod A for bed mobility this session. Upon sitting, pt with + orthostatics and BP dropped to 98/48 mmHg (see vitals flowsheet for all BPs). Pt's BP returning to 128/63 upon return to supine. Per pt's daughter, pt has been requiring increased assist for mobility and has been unable to ambulate or perform transfers without assist. Feel pt would benefit from SNF level therapies at d/c to increase independence and safety with mobility prior to return home. Will continue to follow acutely.      Follow Up Recommendations SNF;Supervision/Assistance - 24 hour    Equipment Recommendations  None recommended by PT    Recommendations for Other Services       Precautions / Restrictions Precautions Precautions: Fall Restrictions Weight Bearing Restrictions: No      Mobility  Bed Mobility Overal bed mobility: Needs Assistance Bed Mobility: Supine to Sit;Sit to Supine     Supine to sit: Mod assist Sit to supine: Min assist   General bed mobility comments: Mod A for trunk elevation and LE assist to come to sitting. BP dropping to 98/48 in sitting, but pt was able to tolerate eating applesauce in sitting. Further mobility deferred. Min A for LE assist for return to supine.   Transfers                    Ambulation/Gait                Stairs            Wheelchair Mobility    Modified Rankin (Stroke Patients Only)       Balance Overall balance assessment: Needs assistance Sitting-balance support: No upper extremity supported;Feet supported Sitting balance-Leahy Scale: Fair                                        Pertinent Vitals/Pain Pain Assessment: Faces Faces Pain Scale: No hurt    Home Living Family/patient expects to be discharged to:: Private residence Living Arrangements: Children Available Help at Discharge: Family;Available 24 hours/day Type of Home: House Home Access: Stairs to enter Entrance Stairs-Rails: None Entrance Stairs-Number of Steps: 2 Home Layout: One level Home Equipment: Walker - 4 wheels      Prior Function Level of Independence: Needs assistance   Gait / Transfers Assistance Needed: Daughter has been having to help with transfers for the past week. Prior was ambulating indpendently. Started seeing decline 2 weeks ago.   ADL's / Homemaking Assistance Needed: Daughter has to assist with bathing tasks. Usually able to transfer into shower with assist.         Hand Dominance        Extremity/Trunk Assessment   Upper Extremity Assessment Upper Extremity Assessment: Defer to OT evaluation    Lower Extremity Assessment Lower Extremity Assessment: Generalized weakness    Cervical / Trunk Assessment Cervical / Trunk Assessment: Normal  Communication   Communication: HOH  Cognition Arousal/Alertness: Lethargic;Awake/alert Behavior During Therapy: Flat affect Overall Cognitive Status: History of cognitive impairments - at baseline  General Comments: Increased alertness when sitting EOB. Initially very lethargic. Dementia at baseline       General Comments General comments (skin integrity, edema, etc.): Pt's daughter present during session     Exercises     Assessment/Plan    PT Assessment Patient needs continued PT services  PT Problem List Decreased strength;Decreased activity tolerance;Decreased mobility;Decreased balance;Decreased cognition;Decreased knowledge of use of DME;Decreased safety awareness;Decreased knowledge of precautions       PT Treatment Interventions DME instruction;Gait  training;Therapeutic activities;Functional mobility training;Therapeutic exercise;Balance training;Patient/family education;Cognitive remediation    PT Goals (Current goals can be found in the Care Plan section)  Acute Rehab PT Goals Patient Stated Goal: for pt to get stronger before returning home per daughter PT Goal Formulation: With patient/family Time For Goal Achievement: 07/23/19 Potential to Achieve Goals: Good    Frequency Min 2X/week   Barriers to discharge        Co-evaluation               AM-PAC PT "6 Clicks" Mobility  Outcome Measure Help needed turning from your back to your side while in a flat bed without using bedrails?: A Little Help needed moving from lying on your back to sitting on the side of a flat bed without using bedrails?: A Lot Help needed moving to and from a bed to a chair (including a wheelchair)?: A Lot Help needed standing up from a chair using your arms (e.g., wheelchair or bedside chair)?: A Lot Help needed to walk in hospital room?: Total Help needed climbing 3-5 steps with a railing? : Total 6 Click Score: 11    End of Session   Activity Tolerance: Treatment limited secondary to medical complications (Comment) (+ orthostatics) Patient left: in bed;with call bell/phone within reach;with nursing/sitter in room;with family/visitor present (on stretcher in ED ) Nurse Communication: Mobility status;Other (comment) (+ orthostatics) PT Visit Diagnosis: Unsteadiness on feet (R26.81);Muscle weakness (generalized) (M62.81);Difficulty in walking, not elsewhere classified (R26.2)    Time: 1610-9604 PT Time Calculation (min) (ACUTE ONLY): 21 min   Charges:   PT Evaluation $PT Eval Moderate Complexity: 1 Mod          Reuel Derby, PT, DPT  Acute Rehabilitation Services  Pager: [phone] Office: [phone]   Rudean Hitt 07/09/2019, 6:16 PM

## 2019-07-09 NOTE — ED Triage Notes (Signed)
Pt BIB [facility] from home following near syncopal event. Pt's daughter got pt up to toilet, says patient became unresponsive for approx 2-3 mins. Per EMS, initial pressure 96/70, now 111/56. Pt recently taken off all medications except dementia and thryroid meds. Hx dementia. NAD noted.

## 2019-07-09 NOTE — ED Provider Notes (Signed)
[facility] Provider Note   CSN: [id number] Arrival date & time: 07/09/19  1113     History Chief Complaint  Patient presents with  . Near Syncope    Kristina [name] is a 86 y.o. female with past medical history of dementia, hyperlipidemia, hypertension, hypothyroidism brought to ED by EMS from home for evaluation of possible syncopal or near syncopal episode.  Patient is difficult historian due to dementia, level 5 caveat.  She is able to tell me her name.  She thinks her relative is her sister but she is her daughter.  She thinks she is in the hospital to get some labs done.  Denies any pain.  Obtained collateral information from daughter [name] at bedside who lives with patient.  Reports in the last 2 to 3 days patient has been unable to ambulate independently.  Has had gradual decline in her mobility status at home, "more feeble", for the last week.  She now needs total assistance with ambulation and transfer and walker.  This morning patient was assisted to the commode where she sat down for a few minutes.  Her daughter noticed that she stared off into the distance in her eyes went "glossy" for 2 to 3 minutes.  She was slowly looking around the room.  The patient did not respond to verbal stimulus or her daughter waving her hand in front of her face.  Eventually she returned to baseline and has since been at her baseline for the last few days.  There is no syncope or fall to the ground.  There is no shaking or convulsions.  Daughter states in the last few days she has also had difficulty swallowing her pills.  She has had less food and fluids.  Daughter took patient to her primary care doctor on Thursday for physical decline.  Her primary care doctor got lab work and called her to tell her that patient seemed to be dehydrated.  PCP has put orders for home health assistance.  Daughter at bedside is hoping that patient can stay and live with them for as  long as possible but notes it has become more difficult to provide 24/7 care for her mother.  No history of seizures. No recent falls. No fevers, vomiting, diarrhea per daughter. Chronic incontinence unchanged.   HPI     Past Medical History:  Diagnosis Date  . Benign paroxysmal positional vertigo   . Diverticulosis   . Duodenal ulcer   . Endometrial cancer [facility])    age 8  . GERD (gastroesophageal reflux disease)   . Hypertension   . Hypothyroidism   . Memory loss   . Peripheral neuropathy   . Tubulovillous adenoma polyp of colon 04/1998    Patient Active Problem List   Diagnosis Date Noted  . Orthostatic hypotension 07/09/2019  . Macrocytic anemia 07/09/2019  . Thrombocytopenia ([place]) 07/09/2019  . Pressure injury of buttock, stage 3 ([place]) 07/05/2019  . Weakness 07/15/2018  . Acute on chronic kidney failure ([place]) 07/15/2018  . Hypertriglyceridemia 02/07/2017  . Vitamin D deficiency 08/06/2016  . Physical exam 08/06/2016  . Abnormality of gait 04/23/2016  . Hypothyroidism 02/05/2016  . HTN (hypertension) 02/05/2016  . Dementia ([place]) 09/23/2015  . Paresthesia 01/28/2014  . Hyperreflexia 01/28/2014  . GERD 01/16/2008  . OTHER POSTOPERATIVE FUNCTIONAL DISORDERS 01/16/2008  . TUBULOVILLOUS ADENOMA, COLON, HX OF 01/10/2008    Past Surgical History:  Procedure Laterality Date  . CATARACT EXTRACTION    . CHOLECYSTECTOMY    .  FOOT SURGERY     Right  . KNEE SURGERY     Right  . VAGINAL HYSTERECTOMY       OB History   No obstetric history on file.     Family History  Problem Relation Age of Onset  . Dementia Mother   . Heart attack Father   . Heart disease Sister   . Breast cancer Sister 24  . Cancer Brother   . Colon cancer Neg Hx     Social History   Tobacco Use  . Smoking status: Never Smoker  . Smokeless tobacco: Never Used  Vaping Use  . Vaping Use: Never used  Substance Use Topics  . Alcohol use: No    Alcohol/week: 0.0 standard drinks  . Drug  use: No    Home Medications Prior to Admission medications   Medication Sig Start Date End Date Taking? Authorizing Provider  [facility] D3 50 MCG (2000 UT) CAPS TAKE 1 CAPSULE DAILY Patient taking differently: Take 2,000 Units by mouth daily.  01/02/18   Midge Minium, MD  diphenhydrAMINE (BENADRYL) 25 MG tablet Take 2 tablets (50 mg total) by mouth every 6 (six) hours as needed for itching (or hives). 06/14/16   Forde Dandy, MD  EPINEPHrine 0.3 mg/0.3 mL IJ SOAJ injection Inject 0.3 mLs (0.3 mg total) into the muscle once as needed for up to 1 dose (severe allergic reaction). 01/26/19   Midge Minium, MD  fluticasone (FLONASE) 50 MCG/ACT nasal spray Place 2 sprays into both nostrils daily as needed (for seasonal allergies). SPRAY 2 SPRAYS INTO EACH NOSTRIL EVERY DAY 07/17/18   Geradine Girt, DO  levothyroxine (SYNTHROID) 75 MCG tablet TAKE 1 TABLET BY MOUTH EVERY DAY 03/07/19   Midge Minium, MD  loperamide (IMODIUM) 2 MG capsule Take 2 mg by mouth at bedtime.     [provider]  memantine (NAMENDA) 10 MG tablet TAKE 1 TABLET BY MOUTH TWICE A DAY. Patient taking differently: Take 10 mg by mouth 2 (two) times daily.  06/08/18   Marcial Pacas, MD  naproxen sodium (ANAPROX) 220 MG tablet Take 220 mg by mouth at bedtime.     [provider]  Polyethyl Glyc-Propyl Glyc PF (SYSTANE PRESERVATIVE FREE) 0.4-0.3 % SOLN Place 1-2 drops into both eyes at bedtime.    [provider]  triamcinolone ointment (KENALOG) 0.1 % Apply 1 application topically 2 (two) times daily as needed (as directed, to affected areas). 07/19/18   Midge Minium, MD    Allergies    Bee venom and Penicillins  Review of Systems   Review of Systems  Unable to perform ROS: Dementia    Physical Exam Updated Vital Signs BP 129/66 (BP Location: Right Arm)   Pulse 67   Temp 98.2 F (36.8 C) (Rectal)   Resp 14   SpO2 97%   Physical Exam Vitals and nursing note reviewed.    Constitutional:      Appearance: She is well-developed.     Comments: Non toxic in NAD  HENT:     Head: Normocephalic and atraumatic.     Nose: Nose normal.     Mouth/Throat:     Mouth: Mucous membranes are dry.     Comments: Dry lips and mucous membranes. Eyes:     Conjunctiva/sclera: Conjunctivae normal.  Cardiovascular:     Rate and Rhythm: Normal rate and regular rhythm.     Comments: Trace pitting edema pretibial bilateral, symmetric.  No calf tenderness. Pulmonary:  Effort: Pulmonary effort is normal.     Breath sounds: Normal breath sounds.  Abdominal:     General: Bowel sounds are normal.     Palpations: Abdomen is soft.     Tenderness: There is no abdominal tenderness.     Comments: Soft, nontender.  No CVA tenderness or suprapubic tenderness.  Genitourinary:    Comments: 2 superficial ulcerated wounds in bilateral buttocks with mild surrounding erythema but no tenderness, purulence or bleeding from the wounds.  Dried blood noted in the back of her depends. Musculoskeletal:        General: Normal range of motion.     Cervical back: Normal range of motion.  Skin:    General: Skin is warm and dry.     Capillary Refill: Capillary refill takes less than 2 seconds.  Neurological:     Mental Status: She is alert. She is disoriented.     Comments:   Mental Status: Patient is awake, alert. Disoriented to events, year, place.  Patient unable to give a clear and coherent history.  Speech is fluent and clear without dysarthria or aphasia.   Cranial Nerves: I not tested II visual fields unable to be tested due to patient dementia. PERRL.   III, IV, VI EOMs intact without ptosis V sensation to light touch intact in all 3 divisions of trigeminal nerve bilaterally  VII facial movements symmetric bilaterally VIII hearing intact to voice/conversation  IX, X no uvula deviation, symmetric rise of soft palate/uvula XI 5/5 SCM and trapezius strength bilaterally  XII tongue  protrusion midline, symmetric L/R movements  Motor: Strength 5/5 in upper/lower extremities .   Sensation to light touch intact in face, upper/lower extremities. No pronator drift. No leg drop.  Cerebellar: Patient with dementia unable to follow commands to assess FTN or HTS Patient needs assistance to sit up in bed. Unable to assess gait/romberg.  Psychiatric:        Behavior: Behavior normal.     ED Results / Procedures / Treatments   Labs (all labs ordered are listed, but only abnormal results are displayed) Labs Reviewed  CBC WITH DIFFERENTIAL/PLATELET - Abnormal; Notable for the following components:      Result Value   RBC 3.75 (*)    Hemoglobin 11.9 (*)    MCV 100.8 (*)    Platelets 136 (*)    Neutro Abs 8.1 (*)    Abs Immature Granulocytes 0.08 (*)    All other components within normal limits  BASIC METABOLIC PANEL - Abnormal; Notable for the following components:   Sodium 147 (*)    Chloride 112 (*)    Glucose, Bld 120 (*)    BUN 56 (*)    Creatinine, Ser 1.94 (*)    Calcium 8.7 (*)    GFR calc non Af Amer 23 (*)    GFR calc Af Amer 27 (*)    All other components within normal limits  URINALYSIS, ROUTINE W REFLEX MICROSCOPIC - Abnormal; Notable for the following components:   APPearance HAZY (*)    All other components within normal limits  BASIC METABOLIC PANEL  CBC  CREATININE, SERUM  MAGNESIUM  PHOSPHORUS  TSH  CBC  COMPREHENSIVE METABOLIC PANEL  VITAMIN Y09  FOLATE    EKG EKG Interpretation  Date/Time:  Monday July 09 2019 11:13:49 EDT Ventricular Rate:  78 PR Interval:    QRS Duration: 126 QT Interval:  439 QTC Calculation: 501 R Axis:   55 Text Interpretation: Sinus rhythm Right bundle  branch block Confirmed by Davonna Belling [phone]) on 07/09/2019 1:27:47 PM   Radiology No results found.  Procedures Procedures (including critical care time)  Medications Ordered in ED Medications  enoxaparin (LOVENOX) injection 40 mg (has no  administration in time range)  0.9 %  sodium chloride infusion (has no administration in time range)  acetaminophen (TYLENOL) tablet 650 mg (has no administration in time range)    Or  acetaminophen (TYLENOL) suppository 650 mg (has no administration in time range)  ondansetron (ZOFRAN) tablet 4 mg (has no administration in time range)    Or  ondansetron (ZOFRAN) injection 4 mg (has no administration in time range)  sodium chloride 0.9 % bolus 1,000 mL (0 mLs Intravenous Stopped 07/09/19 1441)  sodium chloride 0.9 % bolus 1,000 mL (0 mLs Intravenous Stopped 07/09/19 1726)    ED Course  I have reviewed the triage vital signs and the nursing notes.  Pertinent labs & imaging results that were available during my care of the patient were reviewed by me and considered in my medical decision making (see chart for details).  Clinical Course as of Jul 09 1750  Mon Jul 09, 2019  1244 Hemoglobin(!): 11.9 [CG]  1325 RBC(!): 3.75 [CG]  1325 Sodium(!): 147 [CG]  1325 BUN(!): 56 [CG]  1325 Creatinine(!): 1.94 [CG]  1325 GFR, Est Non African American(!): 23 [CG]  1600 I spoke to Dr Cletus Gash who has reviewed patient's chart. Requesting admission for observation/admission.  She recommends PT eval, IVF, repeat, BMP to determine disposition.  If family agreeable and PT recommends home PT, pt could be discharged.  Repage hospitalist again if PT recommending SNF placement for admisison    [CG]  [place] spoke to [place], recommends SNF placement. Patient became orthostatic SBP 120s > 90s during assessment.    [CG]    Clinical Course User Index [CG] Arlean Hopping   MDM Rules/Calculators/A&P                          86 yo F presents with episode of staring off into distance for 2-3 min while on toilet. Witnessed by primary care giver, daughter.  No fall to the ground. No convulsions. No postictal state, bladder or bowel incontinence or history of seizure.  No history of seizures.  Patient now at her baseline. Patient has had gradual decline in the last 1 week, seen by PCP.  I obtained collateral information from patient's daughter at bedside. I have reviewed the medical records to obtain more history and assist with MDM.  In summary, PCP ordered home health aid services.  Family would like to keep patient living at home for as long as possible.  Recent medication changes by PCP.   I have ordered lab work, urinalysis, EKG.  Patient is clinically dehydrated with very dry mucous membranes and lips. I have ordered IV fluids. I have ordered orthostatics.  I have ordered continuous pulse ox and cardiac monitoring.  Exam is benign, patient had cognitive baseline per daughter. Chronic pressure ulcers on buttocks without evidence of infection. Neurological exam is limited due to dementia. No obvious cranial nerve or unilateral deficits. SBP's have been soft in the high 90s to 100s. No tachycardia. No rectal fever.  Differential diagnosis is multifactorial. This includes electrolyte abnormalities, dehydration, vasovagal episode or orthostasis. Occult infection like UTI also possible but less likely. Overall presentation is not consistent with stroke, seizure cardiac etiology.  1355: Orthostatics negative. I  have personally reviewed lab work, EKG. Hypernatremia with elevation in creatinine and BUN. This is slightly improved from labs done last week by PCP. GFR is 23. Very mild anemia. No leukocytosis. EKG without arrhythmias, ischemia. No events on cardiac monitoring. Pending urinalysis.  1750: Urinalysis unremarkable.  I reevaluated patient, seems a little bit more alert.  Has drank water and eating applesauce.  PT evaluated patient here and reported patient became orthostatic with SBP 120>90s and dizzy, unable to do their assessment.  They recommend SNF.  We will start another liter of IV fluid.  Discussed with hospitalist who will admit patient.  Family updated.  Final Clinical  Impression(s) / ED Diagnoses Final diagnoses:  Dehydration    Rx / DC Orders ED Discharge Orders    None       Arlean Hopping 07/09/19 1752    Davonna Belling, MD 07/10/19 1455

## 2019-07-09 NOTE — ED Notes (Signed)
PT at Bedside.

## 2019-07-09 NOTE — ED Notes (Signed)
Pt very much asleep and would fall asleep very shortly after awakening. PO intake left at bedside and family member instructed to offer PO intake if she arouses while this RN is not present.

## 2019-07-09 NOTE — ED Notes (Signed)
This RN attempted to draw this pts BMP; this RN was unsuccessful and Phlebotomy was notified.

## 2019-07-09 NOTE — Care Management (Signed)
ED CM spoke with PT who is recommending SNF, PT mentioned  Patient appeared orthostatic on evaluation, updated the Carmon Sails PA-C.  ED evaluation still in progress.  TOC will continue to follow for transitional care planning

## 2019-07-09 NOTE — Telephone Encounter (Signed)
Debbie from [facility] called in asking for a verbal order for Kristina [name] to have a physical therapy evaluation. She states that we can leave a Verbal order on her VM if she doesn't answer. Debbie [phone].

## 2019-07-09 NOTE — Telephone Encounter (Signed)
Ok for verbal order  °

## 2019-07-09 NOTE — Telephone Encounter (Signed)
Called and LMOVM giving the verbal ok.

## 2019-07-09 NOTE — Telephone Encounter (Signed)
Ok to evaluation (although pt is currently in ER)

## 2019-07-09 NOTE — ED Notes (Signed)
Dr. Doristine Bosworth paged to [place] per her request

## 2019-07-10 ENCOUNTER — Other Ambulatory Visit: Payer: Self-pay

## 2019-07-10 ENCOUNTER — Inpatient Hospital Stay (HOSPITAL_COMMUNITY): Payer: Medicare Other

## 2019-07-10 DIAGNOSIS — R55 Syncope and collapse: Secondary | ICD-10-CM

## 2019-07-10 DIAGNOSIS — L8990 Pressure ulcer of unspecified site, unspecified stage: Secondary | ICD-10-CM | POA: Insufficient documentation

## 2019-07-10 LAB — CBC
HCT: 37.4 % (ref 36.0–46.0)
Hemoglobin: 12 g/dL (ref 12.0–15.0)
MCH: 32.2 pg (ref 26.0–34.0)
MCHC: 32.1 g/dL (ref 30.0–36.0)
MCV: 100.3 fL — ABNORMAL HIGH (ref 80.0–100.0)
Platelets: 142 10*3/uL — ABNORMAL LOW (ref 150–400)
RBC: 3.73 MIL/uL — ABNORMAL LOW (ref 3.87–5.11)
RDW: 13.8 % (ref 11.5–15.5)
WBC: 11.8 10*3/uL — ABNORMAL HIGH (ref 4.0–10.5)
nRBC: 0 % (ref 0.0–0.2)

## 2019-07-10 LAB — COMPREHENSIVE METABOLIC PANEL
ALT: 11 U/L (ref 0–44)
AST: 28 U/L (ref 15–41)
Albumin: 2.2 g/dL — ABNORMAL LOW (ref 3.5–5.0)
Alkaline Phosphatase: 18 U/L — ABNORMAL LOW (ref 38–126)
Anion gap: 10 (ref 5–15)
BUN: 40 mg/dL — ABNORMAL HIGH (ref 8–23)
CO2: 21 mmol/L — ABNORMAL LOW (ref 22–32)
Calcium: 7.9 mg/dL — ABNORMAL LOW (ref 8.9–10.3)
Chloride: 114 mmol/L — ABNORMAL HIGH (ref 98–111)
Creatinine, Ser: 1.43 mg/dL — ABNORMAL HIGH (ref 0.44–1.00)
GFR calc Af Amer: 39 mL/min — ABNORMAL LOW (ref >60)
GFR calc non Af Amer: 33 mL/min — ABNORMAL LOW (ref >60)
Glucose, Bld: 93 mg/dL (ref 70–99)
Potassium: 3.9 mmol/L (ref 3.5–5.1)
Sodium: 145 mmol/L (ref 135–145)
Total Bilirubin: 0.7 mg/dL (ref 0.3–1.2)
Total Protein: 5.3 g/dL — ABNORMAL LOW (ref 6.5–8.1)

## 2019-07-10 LAB — LACTIC ACID, PLASMA: Lactic Acid, Venous: 2.4 mmol/L (ref 0.5–1.9)

## 2019-07-10 LAB — ECHOCARDIOGRAM COMPLETE: Weight: 2437.41 oz

## 2019-07-10 MED ORDER — DEXTROSE 5 % IV SOLN: INTRAVENOUS | Status: DC

## 2019-07-10 MED ORDER — ENOXAPARIN SODIUM 30 MG/0.3ML ~~LOC~~ SOLN
30.0000 mg | SUBCUTANEOUS | Status: DC
  Administered 2019-07-10 – 2019-07-11 (×2): 30 mg via SUBCUTANEOUS
  Filled 2019-07-10 (×2): qty 0.3

## 2019-07-10 MED ORDER — ADULT MULTIVITAMIN W/MINERALS CH
1.0000 | ORAL_TABLET | Freq: Every day | ORAL | Status: DC
  Administered 2019-07-10 – 2019-07-12 (×3): 1 via ORAL
  Filled 2019-07-10 (×3): qty 1

## 2019-07-10 MED ORDER — ENSURE ENLIVE PO LIQD
237.0000 mL | Freq: Two times a day (BID) | ORAL | Status: DC
  Administered 2019-07-10 – 2019-07-12 (×5): 237 mL via ORAL

## 2019-07-10 NOTE — NC FL2 (Signed)
[place] LEVEL OF CARE SCREENING TOOL     IDENTIFICATION  Patient Name: Kristina [name] Birthdate: 1931-09-18 Sex: female Admission Date (Current Location): 07/09/2019  [facility] and [place] Number:  Herbalist and Address:  [facility] [place]. [facility], [place] [address], [place], [place] [address]      Provider Number: [id number]  Attending Physician Name and Address:  Caren Griffins, MD  Relative Name and Phone Number:       Current Level of Care: Hospital Recommended Level of Care: [place] Prior Approval Number:    Date Approved/Denied:   PASRR Number: [id number]  Discharge Plan: SNF    Current Diagnoses: Patient Active Problem List   Diagnosis Date Noted  . Pressure injury of skin 07/10/2019  . Orthostatic hypotension 07/09/2019  . Macrocytic anemia 07/09/2019  . Thrombocytopenia ([place]) 07/09/2019  . Pressure injury of buttock, stage 3 ([place]) 07/05/2019  . Weakness 07/15/2018  . Acute on chronic kidney failure ([place]) 07/15/2018  . Hypertriglyceridemia 02/07/2017  . Vitamin D deficiency 08/06/2016  . Physical exam 08/06/2016  . Abnormality of gait 04/23/2016  . Hypothyroidism 02/05/2016  . HTN (hypertension) 02/05/2016  . Dementia ([place]) 09/23/2015  . Paresthesia 01/28/2014  . Hyperreflexia 01/28/2014  . GERD 01/16/2008  . OTHER POSTOPERATIVE FUNCTIONAL DISORDERS 01/16/2008  . TUBULOVILLOUS ADENOMA, COLON, HX OF 01/10/2008    Orientation RESPIRATION BLADDER Height & Weight     Self  Normal Incontinent Weight: 152 lb 5.4 oz (69.1 kg) Height:     BEHAVIORAL SYMPTOMS/MOOD NEUROLOGICAL BOWEL NUTRITION STATUS      Continent Diet (see discharge summary)  AMBULATORY STATUS COMMUNICATION OF NEEDS Skin   Extensive Assist Verbally PU Stage and Appropriate Care (stage 2 on buttocks; pressure injury on sacrum)   PU Stage 2 Dressing:  (foam dressing PRN on buttocks)                   Personal Care Assistance  Level of Assistance  Bathing, Feeding, Dressing Bathing Assistance: Maximum assistance Feeding assistance: Independent Dressing Assistance: Maximum assistance     Functional Limitations Info  Sight, Hearing, Speech          SPECIAL CARE FACTORS FREQUENCY  PT (By licensed PT), OT (By licensed OT)     PT Frequency: 5x week OT Frequency: 5x week            Contractures Contractures Info: Not present    Additional Factors Info  Code Status, Allergies, Psychotropic Code Status Info: DNR Allergies Info: Bee Venom, Penicillins Psychotropic Info: memantine (NAMENDA) tablet 10 mg 2x daily PO         Current Medications (07/10/2019):  This is the current hospital active medication list Current Facility-Administered Medications  Medication Dose Route Frequency Provider Last Rate Last Admin  . acetaminophen (TYLENOL) tablet 650 mg  650 mg Oral Q6H PRN Pahwani, Rinka R, MD       Or  . acetaminophen (TYLENOL) suppository 650 mg  650 mg Rectal Q6H PRN Pahwani, Rinka R, MD      . dextrose 5 % solution   Intravenous Continuous Caren Griffins, MD 50 mL/hr at 07/10/19 0708 New Bag at 07/10/19 0708  . enoxaparin (LOVENOX) injection 40 mg  40 mg Subcutaneous Q24H Pahwani, Rinka R, MD      . feeding supplement (ENSURE ENLIVE) (ENSURE ENLIVE) liquid 237 mL  237 mL Oral BID BM Pahwani, Rinka R, MD      . levothyroxine (SYNTHROID)  tablet 75 mcg  75 mcg Oral Q0600 Mckinley Jewel, MD   75 mcg at 07/10/19 0608  . memantine (NAMENDA) tablet 10 mg  10 mg Oral BID Pahwani, Rinka R, MD      . ondansetron (ZOFRAN) tablet 4 mg  4 mg Oral Q6H PRN Pahwani, Rinka R, MD       Or  . ondansetron (ZOFRAN) injection 4 mg  4 mg Intravenous Q6H PRN Pahwani, Rinka R, MD         Discharge Medications: Please see discharge summary for a list of discharge medications.  Relevant Imaging Results:  Relevant Lab Results:   Additional Information SS#[id number] [place], [place]

## 2019-07-10 NOTE — TOC Initial Note (Signed)
Transition of Care [facility]) - Initial/Assessment Note    Patient Details  Name: Kristina [name] MRN: [id number] Date of Birth: 28-Sep-1931  Transition of [facility]) CM/SW Contact:    Alexander Mt, LCSW Phone Number: 07/10/2019, 10:45 AM  Clinical Narrative:                 CSW spoke with pt daughter Kristina [name] via telephone [phone]), per RN pt only oriented to self. CSW introduced self, role, reason for call. Confirmed home address and PCP. Pt from home with her adult daughter Kristina [name] and her other children are nearby in [place]. Pt has had a gradual decline over the past few weeks, she has gotten more dependent it sounds with her walker. CSW explained recommendations for SNF to [place]. She states her family had all discussed this last night and feel this may be best for pt in the short term to gain some strength and return with home health when she can. CSW briefly explained referral process and that we would need prior authorization through her insurance plan. Pt daughter interested in [facility] referral. CSW will initiate referral process and f/u with offers with family.   Expected Discharge Plan: Skilled Nursing Facility Barriers to Discharge: Continued Medical Work up, Ship broker   Patient Goals and CMS Choice Patient states their goals for this hospitalization and ongoing recovery are:: for her to get stronger and be able to return home CMS Medicare.gov Compare Post Acute Care list provided to:: Patient Represenative (must comment) (pt daughter Kristina [name]) Choice offered to / list presented to : Adult Children  Expected Discharge Plan and Services Expected Discharge Plan: [place] In-house Referral: Clinical Social Work Discharge Planning Services: CM Consult Post Acute Care Choice: [place], Durable Medical Equipment Living arrangements for the past 2 months: [place]    Prior Living Arrangements/Services Living  arrangements for the past 2 months: Single Family Home Lives with:: Adult Children Patient language and need for interpreter reviewed:: Yes (no needs) Do you feel safe going back to the place where you live?: Yes      Need for Family Participation in Patient Care: Yes (Comment) (assistance with daily cares) Care giver support system in place?: Yes (comment) (adult children) Current home services: DME Criminal Activity/Legal Involvement Pertinent to Current Situation/Hospitalization: No - Comment as needed  Activities of Daily Living   ADL Screening (condition at time of admission) Patient's cognitive ability adequate to safely complete daily activities?: No Is the patient deaf or have difficulty hearing?: Yes Does the patient have difficulty seeing, even when wearing glasses/contacts?: Yes Does the patient have difficulty concentrating, remembering, or making decisions?: Yes Patient able to express need for assistance with ADLs?: Yes Does the patient have difficulty dressing or bathing?: No Independently performs ADLs?: No Communication: Independent (confused/dementia. oriented to self) Dressing (OT): Needs assistance Is this a change from baseline?: Change from baseline, expected to last <3days Grooming: Needs assistance, Dependent Is this a change from baseline?: Change from baseline, expected to last <3 days Feeding: Needs assistance Is this a change from baseline?: Change from baseline, expected to last <3 days Bathing: Needs assistance, Dependent Is this a change from baseline?: Change from baseline, expected to last <3 days Toileting: Needs assistance, Dependent Is this a change from baseline?: Change from baseline, expected to last <3 days In/Out Bed: Needs assistance, Dependent Is this a change from baseline?: Change from baseline, expected to last <3 days Walks in Home: Needs assistance, Dependent  Is this a change from baseline?: Change from baseline, expected to last <3  days Does the patient have difficulty walking or climbing stairs?: Yes Weakness of Legs: Both Weakness of Arms/Hands: Both  Permission Sought/Granted Permission sought to share information with : Family Supports Permission granted to share information with : No (pt only oriented to self)  Share Information with NAME: Garret Reddish  Permission granted to share info w AGENCY: SNFs  Permission granted to share info w Relationship: daughter  Permission granted to share info w Contact Information: [phone]  Emotional Assessment Appearance:: Other (Comment Required (assessment with pt daughter Kristina [name]) Attitude/Demeanor/Rapport: Other (comment) (assessment with pt daughter Kristina [name]) Affect (typically observed): Other (comment) (assessment with pt daughter Kristina [name]) Orientation: : Oriented to Self, Fluctuating Orientation (Suspected and/or reported Sundowners) Alcohol / Substance Use: Not Applicable Psych Involvement: No (comment)  Admission diagnosis:  Orthostatic hypotension [I95.1] Dehydration [E86.0] Patient Active Problem List   Diagnosis Date Noted  . Pressure injury of skin 07/10/2019  . Orthostatic hypotension 07/09/2019  . Macrocytic anemia 07/09/2019  . Thrombocytopenia ([place]) 07/09/2019  . Pressure injury of buttock, stage 3 ([place]) 07/05/2019  . Weakness 07/15/2018  . Acute on chronic kidney failure ([place]) 07/15/2018  . Hypertriglyceridemia 02/07/2017  . Vitamin D deficiency 08/06/2016  . Physical exam 08/06/2016  . Abnormality of gait 04/23/2016  . Hypothyroidism 02/05/2016  . HTN (hypertension) 02/05/2016  . Dementia ([place]) 09/23/2015  . Paresthesia 01/28/2014  . Hyperreflexia 01/28/2014  . GERD 01/16/2008  . OTHER POSTOPERATIVE FUNCTIONAL DISORDERS 01/16/2008  . TUBULOVILLOUS ADENOMA, COLON, HX OF 01/10/2008   PCP:  Midge Minium, MD Pharmacy:   [id number] - [place], [place] - [address]  [place]  [address] Phone: [phone] Fax: [phone]   Readmission Risk Interventions Readmission Risk Prevention Plan 07/10/2019  Post Dischage Appt Not Complete  Appt Comments plan for SNF  Medication Screening Complete  Transportation Screening Complete  Some recent data might be hidden

## 2019-07-10 NOTE — Progress Notes (Signed)
PROGRESS NOTE  Kristina [name] ZOX:[id number] DOB: 07-02-1931 DOA: 07/09/2019 PCP: Midge Minium, MD   LOS: 1 day   Brief Narrative / Interim history: 86 year old female with HTN, HLD, hypothyroidism, dementia came to the hospital for evaluation for near syncopal episode.  Apparently patient was assisted to the commode on 6/28 and then after sitting down blacked out and became unresponsive for several minutes.  Eventually patient improved to baseline however was brought by EMS to the hospital.  For 2 days prior to this episode she has not been eating and drinking well and has not been able to ambulate independently.  She is also been having difficulty swallowing her pills.  She had blood work done outpatient on Thursday was found to be dehydrated with acute kidney injury.  In the ED she had soft blood pressures, and was orthostatic on PT evaluation  Subjective / 24h Interval events: Denies any complaints for me this morning, has underlying dementia.  Assessment & Plan: Principal Problem Near syncope due to orthostatic hypotension-this is likely in the setting of decreased p.o. intake.  She was placed on IV fluids, she appears euvolemic/dry still this morning, continue.  Obtain speech therapy evaluation given reported dysphagia at home -Repeat orthostatic vital signs this morning -2D echo has been ordered on admission and pending -Folate, B12, TSH obtained on admission all within acceptable parameters -Patient afebrile, without leukocytosis, urinalysis negative for infection. -PT recommending SNF and social worker consult  Active Problems Acute kidney injury on chronic kidney disease stage IIIb-Baseline creatinine around 1.4, during this admission was as high as 1.9.  It is slowly improving with IV fluids and approaching baseline.  Continue fluids, decrease the rate  Hypernatremia-Sodium gradually improving, continue D5  Hypothyroidism-TSH normal, continue  Synthroid  Dementia-continue Namenda  Thrombocytopenia-mild, improving   Scheduled Meds: . enoxaparin (LOVENOX) injection  40 mg Subcutaneous Q24H  . feeding supplement (ENSURE ENLIVE)  237 mL Oral BID BM  . levothyroxine  75 mcg Oral Q0600  . memantine  10 mg Oral BID   Continuous Infusions: . dextrose 50 mL/hr at 07/10/19 0708   PRN Meds:.acetaminophen **OR** acetaminophen, ondansetron **OR** ondansetron (ZOFRAN) IV  Diet Orders (From admission, onward)    Start     Ordered   07/09/19 1746  Diet regular Room service appropriate? Yes; Fluid consistency: Thin  Diet effective now       Question Answer Comment  Room service appropriate? Yes   Fluid consistency: Thin      07/09/19 1745          DVT prophylaxis: enoxaparin (LOVENOX) injection 40 mg Start: 07/09/19 2200 SCDs Start: 07/09/19 1745    Code Status: DNR  Family Communication: no family at bedside   Status is: Inpatient  Remains inpatient appropriate because:Unsafe d/c plan and Inpatient level of care appropriate due to severity of illness  Dispo: The patient is from: Home              Anticipated d/c is to: SNF              Anticipated d/c date is: 1 day              Patient currently is not medically stable to d/c.  Consultants:  None   Procedures:  2D echo: pending  Microbiology  None   Antimicrobials: None     Objective: Vitals:   07/09/19 2314 07/10/19 0225 07/10/19 0421 07/10/19 0442  BP: 120/65 (!) 134/55  (!) 138/58  Pulse: 62  60  (!) 54  Resp: 16 16    Temp: 97.7 F (36.5 C) 98.6 F (37 C)  97.6 F (36.4 C)  TempSrc: Oral Oral  Oral  SpO2: 99% 99%  99%  Weight:   69.1 kg     Intake/Output Summary (Last 24 hours) at 07/10/2019 1055 Last data filed at 07/10/2019 0300 Gross per 24 hour  Intake 3044.44 ml  Output --  Net 3044.44 ml   Filed Weights   07/10/19 0421  Weight: 69.1 kg    Examination:  Constitutional: NAD Eyes: no scleral icterus ENMT: Mucous membranes are  moist.  Neck: normal, supple Respiratory: clear to auscultation bilaterally, no wheezing, no crackles. Normal respiratory effort.  Cardiovascular: Regular rate and rhythm. No LE edema.  Abdomen: non distended, no tenderness. Bowel sounds positive.  Musculoskeletal: no clubbing / cyanosis.  Skin: no rashes Neurologic: Nonfocal, equal strength  Data Reviewed: I have independently reviewed following labs and imaging studies   CBC: Recent Labs  Lab 07/05/19 0923 07/09/19 1220 07/10/19 0642  WBC 12.0* 10.5 11.8*  NEUTROABS 9.5* 8.1*  --   HGB 13.2 11.9* 12.0  HCT 40.1 37.8 37.4  MCV 98.0 100.8* 100.3*  PLT 205.0 136* 765*   Basic Metabolic Panel: Recent Labs  Lab 07/05/19 0923 07/09/19 1219 07/09/19 1220 07/09/19 1855 07/10/19 0642  NA 151 Repeated and verified X2.*  --  147* 146* 145  K 4.3  --  3.8 4.0 3.9  CL 110  --  112* 114* 114*  CO2 29  --  25 22 21*  GLUCOSE 159*  --  120* 129* 93  BUN 63*  --  56* 49* 40*  CREATININE 2.27*  --  1.94* 1.63* 1.43*  CALCIUM 9.1  --  8.7* 8.0* 7.9*  MG  --  2.3  --   --   --   PHOS  --  3.5  --   --   --    Liver Function Tests: Recent Labs  Lab 07/05/19 0923 07/10/19 0642  AST 19 28  ALT 10 11  ALKPHOS 23* 18*  BILITOT 0.6 0.7  PROT 7.0 5.3*  ALBUMIN 3.6 2.2*   Coagulation Profile: No results for input(s): INR, PROTIME in the last 168 hours. HbA1C: No results for input(s): HGBA1C in the last 72 hours. CBG: No results for input(s): GLUCAP in the last 168 hours.  Recent Results (from the past 240 hour(s))  SARS Coronavirus 2 by RT PCR (hospital order, performed in [facility] hospital lab) Nasopharyngeal Nasopharyngeal Swab     Status: None   Collection Time: 07/09/19  6:21 PM   Specimen: Nasopharyngeal Swab  Result Value Ref Range Status   SARS Coronavirus 2 NEGATIVE NEGATIVE Final    Comment: (NOTE) SARS-CoV-2 target nucleic acids are NOT DETECTED.  The SARS-CoV-2 RNA is generally detectable in upper and  lower respiratory specimens during the acute phase of infection. The lowest concentration of SARS-CoV-2 viral copies this assay can detect is 250 copies / mL. A negative result does not preclude SARS-CoV-2 infection and should not be used as the sole basis for treatment or other patient management decisions.  A negative result may occur with improper specimen collection / handling, submission of specimen other than nasopharyngeal swab, presence of viral mutation(s) within the areas targeted by this assay, and inadequate number of viral copies (<250 copies / mL). A negative result must be combined with clinical observations, patient history, and epidemiological information.  Fact Sheet for Patients:  StrictlyIdeas.no  Fact Sheet for Healthcare Providers: BankingDealers.co.za  This test is not yet approved or  cleared by the [place] FDA and has been authorized for detection and/or diagnosis of SARS-CoV-2 by FDA under an Emergency Use Authorization (EUA).  This EUA will remain in effect (meaning this test can be used) for the duration of the COVID-19 declaration under Section 564(b)(1) of the Act, 21 U.S.C. section 360bbb-3(b)(1), unless the authorization is terminated or revoked sooner.  Performed at [facility], [place] [address] [place], [place] [address]      Radiology Studies: No results found.   Marzetta Board, MD, PhD [facility]  Between 7 am - 7 pm I am available, please contact me via Amion or Securechat  Between 7 pm - 7 am I am not available, please contact night coverage MD/APP via Amion

## 2019-07-10 NOTE — Progress Notes (Signed)
Initial Nutrition Assessment  DOCUMENTATION CODES:   Not applicable  INTERVENTION:   -Continue Ensure Enlive po BID, each supplement provides 350 kcal and 20 grams of protein -MVI with minerals daily  NUTRITION DIAGNOSIS:   Increased nutrient needs related to wound healing as evidenced by estimated needs.  GOAL:   Patient will meet greater than or equal to 90% of their needs  MONITOR:   PO intake, Supplement acceptance, Labs, Weight trends, Skin, I & O's  REASON FOR ASSESSMENT:   Malnutrition Screening Tool    ASSESSMENT:   FLORDIA KASSEM is a 86 y.o. female with medical history significant of hypertension, hyperlipidemia, hypothyroidism, dementia presents to emergency department for the evaluation of near syncope episode.  Pt admitted with syncope/ near syncope with orthostatic hypotension.   6/29- s/p BSE- recommending regular consistency diet with thin liquids  Reviewed I/O's: +3 L x 24 hours  Spoke with pt, who was sitting in recliner chair, drinking Ensure. She reports having a great appetite, consuming all of her breakfast, however, she does not remember what she ate. PTA, she reports good appetite, consuming 3 meals per day, plus an Ensure in the morning. She usually drinks iced tea throughout the day.   Pt denies any weight loss, but is unsure of UBW. Per review of wt records, wt has been stable over the past year.   Medications reviewed and include dextrose 5% solution @ 50 ml/hr.   Per [facility] team notes, plan for SNF placement at discharge.  Labs reviewed.   NUTRITION - FOCUSED PHYSICAL EXAM:    Most Recent Value  Orbital Region No depletion  Upper Arm Region No depletion  Thoracic and Lumbar Region No depletion  Buccal Region No depletion  Temple Region No depletion  Clavicle Bone Region No depletion  Clavicle and Acromion Bone Region No depletion  Scapular Bone Region No depletion  Dorsal Hand No depletion  Patellar Region No depletion   Anterior Thigh Region No depletion  Posterior Calf Region No depletion  Edema (RD Assessment) Mild  Hair Reviewed  Eyes Reviewed  Mouth Reviewed  Skin Reviewed  Nails Reviewed       Diet Order:   Diet Order            Diet regular Room service appropriate? Yes; Fluid consistency: Thin  Diet effective now                 EDUCATION NEEDS:   Education needs have been addressed  Skin:  Skin Assessment: Skin Integrity Issues: Skin Integrity Issues:: Stage II Stage II: buttocks  Last BM:     Height:   Ht Readings from Last 1 Encounters:  07/10/19 5\' 2"  (1.575 m)    Weight:   Wt Readings from Last 1 Encounters:  07/10/19 69.1 kg    Ideal Body Weight:  50 kg  BMI:  Body mass index is 27.86 kg/m.  Estimated Nutritional Needs:   Kcal:  1500-1700  Protein:  75-90 grams  Fluid:  > 1.5 L    Loistine Chance, RD, LDN, [place] Registered Dietitian II Certified Diabetes Care and Education Specialist Please refer to [facility] for RD and/or RD on-call/weekend/after hours pager

## 2019-07-10 NOTE — Plan of Care (Signed)
  Problem: Education: Goal: Knowledge of General Education information will improve Description Including pain rating scale, medication(s)/side effects and non-pharmacologic comfort measures Outcome: Progressing   

## 2019-07-10 NOTE — Evaluation (Signed)
Clinical/Bedside Swallow Evaluation Patient Details  Name: Kristina [name] MRN: [id number] Date of Birth: [birth year]  Today's Date: 07/10/2019 Time: SLP Start Time (ACUTE ONLY): 1502 SLP Stop Time (ACUTE ONLY): 1524 SLP Time Calculation (min) (ACUTE ONLY): 22 min  Past Medical History:  Past Medical History:  Diagnosis Date  . Benign paroxysmal positional vertigo   . Diverticulosis   . Duodenal ulcer   . Endometrial cancer [facility])    age 86  . GERD (gastroesophageal reflux disease)   . Hypertension   . Hypothyroidism   . Memory loss   . Peripheral neuropathy   . Tubulovillous adenoma polyp of colon 04/1998   Past Surgical History:  Past Surgical History:  Procedure Laterality Date  . CATARACT EXTRACTION    . CHOLECYSTECTOMY    . FOOT SURGERY     Right  . KNEE SURGERY     Right  . VAGINAL HYSTERECTOMY     HPI:  Pt is an 86 y/o female admitted secondary to syncopal episode. PMH includes GERD, dementia, and HTN. Previous BSE in July 2020 [facility]. Per MD note, pt has been having difficulty swallowing pills at home.   Assessment / Plan / Recommendation Clinical Impression  Pt has mildly prolonged mastication with minimal oral residue from regular solids such as corn. This clears easily with a liquid wash. Per daughters, her difficulty swallowing solids PTA was primarily c/b trouble clearing them from her oral cavity, but that this seems to have improved since she has been rehydrated in the hospital and she has less xerostomia. We discussed the option of a mechanical soft diet, but pt/family would prefer to maintain a regular diet as her appetite is also an issue at times. They would prefer to have full range of the menu, but will try to select foods that she can masticate well. No acute SLP f/u indicated at this time.  SLP Visit Diagnosis: Dysphagia, unspecified (R13.10)    Aspiration Risk  Mild aspiration risk    Diet Recommendation Regular;Thin liquid   Liquid Administration  via: Cup;Straw Medication Administration: Whole meds with liquid Supervision: Staff to assist with self feeding Compensations: Follow solids with liquid Postural Changes: Seated upright at 90 degrees    Other  Recommendations Oral Care Recommendations: Oral care BID   Follow up Recommendations None      Frequency and Duration            Prognosis Prognosis for Safe Diet Advancement: Good Barriers to Reach Goals: Cognitive deficits      Swallow Study   General HPI: Pt is an 86 y/o female admitted secondary to syncopal episode. PMH includes GERD, dementia, and HTN. Previous BSE in July 2020 [facility]. Per MD note, pt has been having difficulty swallowing pills at home. Type of Study: Bedside Swallow Evaluation Previous Swallow Assessment: see HPI Diet Prior to this Study: Regular;Thin liquids Temperature Spikes Noted: N/A Respiratory Status: Room air History of Recent Intubation: No Behavior/Cognition: Alert;Cooperative;Pleasant mood Oral Cavity Assessment: Within Functional Limits Oral Care Completed by SLP: No Oral Cavity - Dentition: Adequate natural dentition Vision: Functional for self-feeding Self-Feeding Abilities: Able to feed self Patient Positioning: Upright in bed Baseline Vocal Quality: Normal Volitional Swallow: Able to elicit    Oral/Motor/Sensory Function Overall Oral Motor/Sensory Function: Within functional limits   Ice Chips Ice chips: Not tested   Thin Liquid Thin Liquid: Within functional limits Presentation: Self Fed;Straw    Nectar Thick Nectar Thick Liquid: Not tested   Honey Thick Honey Thick Liquid:  Not tested   Puree Puree: Not tested   Solid     Solid: Impaired Presentation: Spoon;Self Fed Oral Phase Impairments: Other (comment) (prologned masticaiton with mild residue)      Osie Bond., M.A. [place] Pager [phone] Office [phone]  07/10/2019,3:27 PM

## 2019-07-10 NOTE — Evaluation (Signed)
Occupational Therapy Evaluation Patient Details Name: Kristina [name] MRN: [id number] DOB: 10-Oct-1931 Today's Date: 07/10/2019    History of Present Illness Pt is an 86 y/o female admitted secondary to syncopal episode. PMH includes dementia and HTN.    Clinical Impression   Pt PTA: pt living with daughter with gradual decline the past few weeks requiring increased assist for ADL and mobility. Pt minA overall for stability with RW and pt fatigues quickly. Pt minA overall for stability and for LB ADL tasks. Pt minguardA at sink for grooming and UB ADL. Pt incontinent this session. Pt would benefit from continued OT skilled services for ADL, mobility and safety. OT following acutely. No family present. + orthostatics: lying: 118/67, 65 BPM; sitting 108/65, 69 BPM; standing 95/49, 69 BPM.      Follow Up Recommendations  SNF;Supervision/Assistance - 24 hour    Equipment Recommendations  Other (comment) (defer to next facility)    Recommendations for Other Services       Precautions / Restrictions Precautions Precautions: Fall Restrictions Weight Bearing Restrictions: No      Mobility Bed Mobility Overal bed mobility: Needs Assistance Bed Mobility: Supine to Sit     Supine to sit: Min assist     General bed mobility comments: minA for trunk elevation and assist to scoot to EOB  Transfers Overall transfer level: Needs assistance Equipment used: Rolling walker (2 wheeled) Transfers: Sit to/from [facility] Sit to Stand: Min guard Stand pivot transfers: Min guard;From elevated surface       General transfer comment: Pt minguardA with RW assist for hand placement    Balance Overall balance assessment: Needs assistance Sitting-balance support: No upper extremity supported;Feet supported Sitting balance-Leahy Scale: Fair     Standing balance support: Bilateral upper extremity supported;Single extremity supported;During functional activity Standing  balance-Leahy Scale: Poor Standing balance comment: able to stand upright with RW                           ADL either performed or assessed with clinical judgement   ADL Overall ADL's : Needs assistance/impaired Eating/Feeding: Set up;Sitting   Grooming: Supervision/safety;Standing Grooming Details (indicate cue type and reason): standing at sink x2 mins Upper Body Bathing: Supervision/ safety;Standing;Cueing for safety   Lower Body Bathing: Minimal assistance;Sitting/lateral leans;Sit to/from stand;Cueing for safety Lower Body Bathing Details (indicate cue type and reason): Pt performing pericare (anterior); requiring assist for posterior care Upper Body Dressing : Supervision/safety;Sitting   Lower Body Dressing: Minimal assistance;Sitting/lateral leans;Sit to/from stand;Cueing for safety Lower Body Dressing Details (indicate cue type and reason): pulling underwear to waist Toilet Transfer: Min guard;Ambulation;RW;Cueing for safety;Cueing for sequencing Toilet Transfer Details (indicate cue type and reason): cues for hand rail use Toileting- Clothing Manipulation and Hygiene: Minimal assistance;Cueing for safety;Sitting/lateral lean;Sit to/from stand Toileting - Clothing Manipulation Details (indicate cue type and reason): assist for posterior pericare     Functional mobility during ADLs: Minimal assistance;Rolling walker;Cueing for safety;Cueing for sequencing General ADL Comments: Pt minA overall for stability and for LB ADL tasks. Pt minguardA at sink for grooming and UB ADL. Pt incontinent this session.     Vision Baseline Vision/History: No visual deficits Patient Visual Report: No change from baseline Vision Assessment?: No apparent visual deficits     Perception     Praxis      Pertinent Vitals/Pain Faces Pain Scale: No hurt     Hand Dominance Right   Extremity/Trunk Assessment Upper Extremity Assessment Upper  Extremity Assessment: Generalized  weakness   Lower Extremity Assessment Lower Extremity Assessment: Generalized weakness;Defer to PT evaluation   Cervical / Trunk Assessment Cervical / Trunk Assessment: Normal   Communication Communication Communication: HOH   Cognition Arousal/Alertness: Lethargic;Awake/alert Behavior During Therapy: Flat affect Overall Cognitive Status: History of cognitive impairments - at baseline                                 General Comments: Pt able A/Ox2 to self and place; unaware of situation, and date   General Comments  No family present    Exercises     Shoulder Instructions      Home Living Family/patient expects to be discharged to:: Private residence Living Arrangements: Children Available Help at Discharge: Family;Available 24 hours/day Type of Home: House Home Access: Stairs to enter [employer] of Steps: 2 Entrance Stairs-Rails: None Home Layout: One level     Bathroom Shower/Tub: Occupational psychologist: Standard     Home Equipment: Environmental consultant - 4 wheels          Prior Functioning/Environment Level of Independence: Needs assistance  Gait / Transfers Assistance Needed: Daughter has been having to help with transfers for the past week. Prior was ambulating indpendently. Started seeing decline 2 weeks ago.  ADL's / Homemaking Assistance Needed: Daughter has to assist with bathing tasks. Usually able to transfer into shower with assist.    Comments: info taken from PT eval        OT Problem List: Decreased strength;Decreased activity tolerance;Impaired balance (sitting and/or standing);Decreased cognition;Decreased knowledge of use of DME or AE;Decreased knowledge of precautions;Increased edema      OT Treatment/Interventions:      OT Goals(Current goals can be found in the care plan section) Acute Rehab OT Goals Patient Stated Goal: pt agreeable to post acute rehab OT Goal Formulation: With patient Time For Goal  Achievement: 07/24/19 Potential to Achieve Goals: Good ADL Goals Pt Will Perform Grooming: with supervision;standing Pt Will Perform Lower Body Dressing: sitting/lateral leans;sit to/from stand;with supervision Pt Will Transfer to Toilet: with supervision;ambulating;bedside commode Pt/caregiver will Perform Home Exercise Program: Increased strength;Both right and left upper extremity;With Supervision Additional ADL Goal #1: Pt will increase to supervisionA for bed mobility as precursor for ADL and mobility.  OT Frequency:     Barriers to D/C:            Co-evaluation              AM-PAC OT "6 Clicks" Daily Activity     Outcome Measure Help from another person eating meals?: None Help from another person taking care of personal grooming?: A Little Help from another person toileting, which includes using toliet, bedpan, or urinal?: A Little Help from another person bathing (including washing, rinsing, drying)?: A Little Help from another person to put on and taking off regular upper body clothing?: None Help from another person to put on and taking off regular lower body clothing?: A Little 6 Click Score: 20   End of Session Equipment Utilized During Treatment: Gait belt;Rolling walker Nurse Communication: Mobility status  Activity Tolerance: Patient limited by pain Patient left: in chair;with call bell/phone within reach;with chair alarm set  OT Visit Diagnosis: Unsteadiness on feet (R26.81);Muscle weakness (generalized) (M62.81)                Time: 1638-4536 OT Time Calculation (min): 47 min Charges:  OT General  Charges $OT Visit: 1 Visit OT Evaluation $OT Eval Moderate Complexity: 1 Mod OT Treatments $Self Care/Home Management : 23-37 mins  Jefferey Pica, OTR/L Acute Rehabilitation Services Pager: [phone] Office: [phone]  Lydiah Pong C 07/10/2019, 12:42 PM

## 2019-07-10 NOTE — TOC Progression Note (Addendum)
Transition of Care [facility]) - Progression Note    Patient Details  Name: Kristina [name] MRN: [id number] Date of Birth: 15-Nov-1931  Transition of Care [facility]) CM/SW [place], [place] Phone Number: 07/10/2019, 3:52 PM  Clinical Narrative:    Prior authorization still pending ref #[id number], confirmed clinicals received. Spoke with Kristina [name] again via telephone, family aware pt could be medically ready as early as tomorrow pending MD clearance. SNF offers and CMS ratings link provided to pt daughter Kristina [name] via email to [email or web address]. She will share with her siblings and they will have choices by tomorrow.   Confirmed pt has had her COVID vaccinations as have her family.    Expected Discharge Plan: [place] Barriers to Discharge: Continued Medical Work up, Ship broker  Expected Discharge Plan and Services Expected Discharge Plan: [place] In-house Referral: Clinical Social Work Discharge Planning Services: CM Consult Post Acute Care Choice: [place], Durable Medical Equipment Living arrangements for the past 2 months: Single Family Home  Readmission Risk Interventions Readmission Risk Prevention Plan 07/10/2019  Post Dischage Appt Not Complete  Appt Comments plan for SNF  Medication Screening Complete  Transportation Screening Complete  Some recent data might be hidden

## 2019-07-10 NOTE — Progress Notes (Signed)
Paged on call [facility] hospitalist M. Sharlet Salina to inform of [place], V 87 YOF Critical Lactic Acid of 2.4  Awaiting response from MD. 07/10/2019 @ 0206 Cyndi Bender, RN

## 2019-07-11 DIAGNOSIS — F0390 Unspecified dementia without behavioral disturbance: Secondary | ICD-10-CM

## 2019-07-11 DIAGNOSIS — E039 Hypothyroidism, unspecified: Secondary | ICD-10-CM

## 2019-07-11 DIAGNOSIS — E860 Dehydration: Principal | ICD-10-CM

## 2019-07-11 DIAGNOSIS — N179 Acute kidney failure, unspecified: Secondary | ICD-10-CM

## 2019-07-11 DIAGNOSIS — I951 Orthostatic hypotension: Secondary | ICD-10-CM

## 2019-07-11 DIAGNOSIS — N1832 Chronic kidney disease, stage 3b: Secondary | ICD-10-CM

## 2019-07-11 LAB — COMPREHENSIVE METABOLIC PANEL
ALT: 11 U/L (ref 0–44)
AST: 24 U/L (ref 15–41)
Albumin: 1.9 g/dL — ABNORMAL LOW (ref 3.5–5.0)
Alkaline Phosphatase: 18 U/L — ABNORMAL LOW (ref 38–126)
Anion gap: 9 (ref 5–15)
BUN: 33 mg/dL — ABNORMAL HIGH (ref 8–23)
CO2: 21 mmol/L — ABNORMAL LOW (ref 22–32)
Calcium: 8.2 mg/dL — ABNORMAL LOW (ref 8.9–10.3)
Chloride: 109 mmol/L (ref 98–111)
Creatinine, Ser: 1.24 mg/dL — ABNORMAL HIGH (ref 0.44–1.00)
GFR calc Af Amer: 46 mL/min — ABNORMAL LOW (ref >60)
GFR calc non Af Amer: 40 mL/min — ABNORMAL LOW (ref >60)
Glucose, Bld: 134 mg/dL — ABNORMAL HIGH (ref 70–99)
Potassium: 3.9 mmol/L (ref 3.5–5.1)
Sodium: 139 mmol/L (ref 135–145)
Total Bilirubin: 0.6 mg/dL (ref 0.3–1.2)
Total Protein: 4.6 g/dL — ABNORMAL LOW (ref 6.5–8.1)

## 2019-07-11 LAB — CBC
HCT: 32.4 % — ABNORMAL LOW (ref 36.0–46.0)
Hemoglobin: 10.4 g/dL — ABNORMAL LOW (ref 12.0–15.0)
MCH: 31.6 pg (ref 26.0–34.0)
MCHC: 32.1 g/dL (ref 30.0–36.0)
MCV: 98.5 fL (ref 80.0–100.0)
Platelets: 126 10*3/uL — ABNORMAL LOW (ref 150–400)
RBC: 3.29 MIL/uL — ABNORMAL LOW (ref 3.87–5.11)
RDW: 13.9 % (ref 11.5–15.5)
WBC: 9.2 10*3/uL (ref 4.0–10.5)
nRBC: 0 % (ref 0.0–0.2)

## 2019-07-11 LAB — MAGNESIUM: Magnesium: 1.8 mg/dL (ref 1.7–2.4)

## 2019-07-11 MED ORDER — ADULT MULTIVITAMIN W/MINERALS CH: 1.0000 | ORAL_TABLET | Freq: Every day | ORAL | Status: AC

## 2019-07-11 MED ORDER — SODIUM CHLORIDE 0.9 % IV SOLN: INTRAVENOUS | Status: DC

## 2019-07-11 MED ORDER — ENSURE ENLIVE PO LIQD: 237.0000 mL | Freq: Two times a day (BID) | ORAL | 12 refills | Status: DC

## 2019-07-11 NOTE — TOC Progression Note (Addendum)
Transition of Care [facility]) - Progression Note    Patient Details  Name: Kristina [name] MRN: [id number] Date of Birth: [birth year]  Transition of Care [facility]) CM/SW [place], [place] Phone Number: 07/11/2019, 10:36 AM  Clinical Narrative:  4:10pm- CSW received update from [facility], the pt that was going to d/c to open up the room for this pt to go to has decided to stay private pay meaning room no longer available. Spoke with [name], she is aware and okay with [facility]. Irine Seal, admissions at [facility] aware, running Medicare benefits. [employer] also contacted, facility choice updated.  3:25pm- CSW spoke with pt daughter [name], let her know [facility] does have a bed, per Dr. Karleen Hampshire pt will remain at hospital today, likely d/c tomorrow pending medical stability. D/c summary paperwork sent to [facility] who was awaiting confirmation of medical stability to approve prior auth.    10:36am- Pt family have decided their preferences for placement are  1) [place] 2) [facility].   CSW reached out to admissions liaison at [facility], they will check on bed availability and get back with this Probation officer. Auth pending facility choice.    Expected Discharge Plan: [place] Barriers to Discharge: Continued Medical Work up, Ship broker  Expected Discharge Plan and Services Expected Discharge Plan: [place] In-house Referral: Clinical Social Work Discharge Planning Services: CM Consult Post Acute Care Choice: [place], Durable Medical Equipment Living arrangements for the past 2 months: Single Family Home  Readmission Risk Interventions Readmission Risk Prevention Plan 07/10/2019  Post Dischage Appt Not Complete  Appt Comments plan for SNF  Medication Screening Complete  Transportation Screening Complete  Some recent data might be hidden

## 2019-07-11 NOTE — Discharge Summary (Signed)
Physician Discharge Summary  Kristina [name]:[id number] DOB: 04-21-1931 DOA: 07/09/2019  PCP: Midge Minium, MD  Admit date: 07/09/2019 Discharge date: 07/12/2019  Admitted From: HOME.  Disposition:  SNF   Recommendations for Outpatient Follow-up:  1. Follow up with PCP in 1-2 weeks 2. Please obtain BMP/CBC in one week 3. Please follow up on the following pending results:  Discharge Condition: guarded.  CODE STATUS:dnr  Diet recommendation: Heart Healthy    Brief/Interim Summary:  86 year old lady prior history of hypertension, hyperlipidemia, hypothyroidism, dementia came in for evaluation of near syncopal episode. On arrival to ED she was found to have borderline blood pressure parameters and orthostatic on PT evaluation.   Discharge Diagnoses:  Principal Problem:   Orthostatic hypotension Active Problems:   Dementia (HCC)   Hypothyroidism   Acute on chronic kidney failure (HCC)   Macrocytic anemia   Thrombocytopenia (HCC)   Pressure injury of skin  Near syncope due to orthostatic hypotension Probably secondary to decreased p.o. intake and dehydration. Repeat orthostatic vital signs were still positive though she was asymptomatic. Recommend continuing IV fluids for another 24 hours.. Patient's B12, TSH and folate were within normal limits. 2D echocardiogram shows Left ventricular ejection fraction, by estimation, is 60 to 65%. The  left ventricle has normal function. The left ventricle has no regional  wall motion abnormalities. There is mild concentric left ventricular  hypertrophy. Left ventricular diastolic  parameters are consistent with Grade I diastolic dysfunction (impaired relaxation).    Dementia Continue with Namenda.   AKI Probably secondary to dehydration and poor oral intake. Improving with IV fluids.   Hypernatremia Secondary to free water deficit from poor oral intake Resolved with IV fluids.   Mild normocytic anemia and mild  thrombocytopenia  Platelets at 126000 and hemoglobin around 10.4, continue to monitor them  Hypothyroidism Continue with Synthroid.  Pressure injury present on admission Pressure Injury 07/09/19 Buttocks Left;Right;Medial Stage 2 -  Partial thickness loss of dermis presenting as a shallow open injury with a red, pink wound bed without slough. (Active)  07/09/19 2326  Location: Buttocks  Location Orientation: Left;Right;Medial  Staging: Stage 2 -  Partial thickness loss of dermis presenting as a shallow open injury with a red, pink wound bed without slough.  Wound Description (Comments):   Present on Admission: Yes     Pressure Injury 07/09/19 Buttocks Left Stage 2 -  Partial thickness loss of dermis presenting as a shallow open injury with a red, pink wound bed without slough. (Active)  07/09/19 2313  Location: Buttocks  Location Orientation: Left  Staging: Stage 2 -  Partial thickness loss of dermis presenting as a shallow open injury with a red, pink wound bed without slough.  Wound Description (Comments):   Present on Admission: Yes     Pressure Injury 07/09/19 Sacrum (Active)  07/09/19 2313  Location: Sacrum  Location Orientation:   Staging:   Wound Description (Comments):   Present on Admission:     Wound care will be consulted.   Discharge Instructions   Allergies as of 07/11/2019      Reactions   Bee Venom Shortness Of Breath, Itching, Rash, Other (See Comments)   Almost passed out, also   Penicillins Rash   Has patient had a PCN reaction causing immediate rash, facial/tongue/throat swelling, SOB or lightheadedness with hypotension: Yes Has patient had a PCN reaction causing severe rash involving mucus membranes or skin necrosis: No Has patient had a PCN reaction that required hospitalization: Unknown Has  patient had a PCN reaction occurring within the last 10 years: No If all of the above answers are "NO", then may proceed with Cephalosporin use.       Medication List    STOP taking these medications   naproxen sodium 220 MG tablet Commonly known as: ALEVE   triamcinolone ointment 0.1 % Commonly known as: KENALOG     TAKE these medications   [employer] D3 50 MCG (2000 UT) Caps Generic drug: Cholecalciferol TAKE 1 CAPSULE DAILY What changed: how much to take   diphenhydrAMINE 25 MG tablet Commonly known as: BENADRYL Take 2 tablets (50 mg total) by mouth every 6 (six) hours as needed for itching (or hives).   EPINEPHrine 0.3 mg/0.3 mL Soaj injection Commonly known as: EPI-PEN Inject 0.3 mLs (0.3 mg total) into the muscle once as needed for up to 1 dose (severe allergic reaction).   feeding supplement (ENSURE ENLIVE) Liqd Take 237 mLs by mouth 2 (two) times daily between meals. Start taking on: July 12, 2019   fluticasone 50 MCG/ACT nasal spray Commonly known as: FLONASE Place 2 sprays into both nostrils daily as needed (for seasonal allergies). SPRAY 2 SPRAYS INTO EACH NOSTRIL EVERY DAY   levothyroxine 75 MCG tablet Commonly known as: SYNTHROID TAKE 1 TABLET BY MOUTH EVERY DAY What changed: when to take this   loperamide 2 MG capsule Commonly known as: IMODIUM Take 2 mg by mouth at bedtime.   memantine 10 MG tablet Commonly known as: NAMENDA TAKE 1 TABLET BY MOUTH TWICE A DAY. What changed:   how much to take  how to take this  when to take this  additional instructions   multivitamin with minerals Tabs tablet Take 1 tablet by mouth daily. Start taking on: July 12, 2019       Follow-up Information    Midge Minium, MD. Schedule an appointment as soon as possible for a visit in 1 week(s).   Specialty: Family Medicine Contact information: [address] [place] [address] [phone]              Allergies  Allergen Reactions   Bee Venom Shortness Of Breath, Itching, Rash and Other (See Comments)    Almost passed out, also   Penicillins Rash    Has patient had a PCN reaction  causing immediate rash, facial/tongue/throat swelling, SOB or lightheadedness with hypotension: Yes Has patient had a PCN reaction causing severe rash involving mucus membranes or skin necrosis: No Has patient had a PCN reaction that required hospitalization: Unknown Has patient had a PCN reaction occurring within the last 10 years: No If all of the above answers are "NO", then may proceed with Cephalosporin use.     Consultations:  Wound care     Procedures/Studies: ECHOCARDIOGRAM COMPLETE  Result Date: 07/10/2019    ECHOCARDIOGRAM REPORT   Patient Name:   Kristina [name] Date of Exam: 07/10/2019 Medical Rec #:  [id number]         Height:       62.0 in Accession #:    [id number]        Weight:       152.3 lb Date of Birth:  01-Nov-1931        BSA:          1.703 m Patient Age:    51 years          BP:           138/58 mmHg Patient Gender:  F                 HR:           87 bpm. Exam Location:  Inpatient Procedure: 2D Echo Indications:    780.2 syncope  History:        Patient has no prior history of Echocardiogram examinations.                 Risk Factors:Hypertension.  Sonographer:    Jannett Celestine RDCS (AE) Referring Phys: [id number] Mckinley Jewel  Sonographer Comments: limited mobilty. IMPRESSIONS  1. Left ventricular ejection fraction, by estimation, is 60 to 65%. The left ventricle has normal function. The left ventricle has no regional wall motion abnormalities. There is mild concentric left ventricular hypertrophy. Left ventricular diastolic parameters are consistent with Grade I diastolic dysfunction (impaired relaxation).  2. Right ventricular systolic function is normal. The right ventricular size is normal.  3. The mitral valve is normal in structure. No evidence of mitral valve regurgitation. No evidence of mitral stenosis.  4. The aortic valve is normal in structure. Aortic valve regurgitation is not visualized. No aortic stenosis is present.  5. The inferior vena cava is normal in  size with greater than 50% respiratory variability, suggesting right atrial pressure of 3 mmHg. FINDINGS  Left Ventricle: Left ventricular ejection fraction, by estimation, is 60 to 65%. The left ventricle has normal function. The left ventricle has no regional wall motion abnormalities. The left ventricular internal cavity size was normal in size. There is  mild concentric left ventricular hypertrophy. Left ventricular diastolic parameters are consistent with Grade I diastolic dysfunction (impaired relaxation). Indeterminate filling pressures. Right Ventricle: The right ventricular size is normal. No increase in right ventricular wall thickness. Right ventricular systolic function is normal. Left Atrium: Left atrial size was normal in size. Right Atrium: Right atrial size was normal in size. Pericardium: A small pericardial effusion is present. The pericardial effusion is circumferential. There is no evidence of cardiac tamponade. Presence of pericardial fat pad. Mitral Valve: The mitral valve is normal in structure. Normal mobility of the mitral valve leaflets. No evidence of mitral valve regurgitation. No evidence of mitral valve stenosis. Tricuspid Valve: The tricuspid valve is normal in structure. Tricuspid valve regurgitation is not demonstrated. No evidence of tricuspid stenosis. Aortic Valve: The aortic valve is normal in structure. Aortic valve regurgitation is not visualized. No aortic stenosis is present. Pulmonic Valve: The pulmonic valve was normal in structure. Pulmonic valve regurgitation is not visualized. No evidence of pulmonic stenosis. Aorta: The aortic root is normal in size and structure. Venous: The inferior vena cava is normal in size with greater than 50% respiratory variability, suggesting right atrial pressure of 3 mmHg. IAS/Shunts: No atrial level shunt detected by color flow Doppler.  LEFT VENTRICLE PLAX 2D LVIDd:         3.20 cm LVIDs:         2.60 cm LV PW:         1.20 cm LV IVS:         1.20 cm LVOT diam:     2.10 cm LV SV:         32 LV SV Index:   19 LVOT Area:     3.46 cm  LEFT ATRIUM         Index LA diam:    2.50 cm 1.47 cm/m  AORTIC VALVE LVOT Vmax:   60.70 cm/s LVOT Vmean:  37.900 cm/s LVOT  VTI:    0.093 m  AORTA Ao Root diam: 2.70 cm MITRAL VALVE MV Area (PHT): 4.15 cm    SHUNTS MV Decel Time: 183 msec    Systemic VTI:  0.09 m MV E velocity: 50.60 cm/s  Systemic Diam: 2.10 cm MV A velocity: 65.10 cm/s MV E/A ratio:  0.78 Mihai Croitoru MD Electronically signed by Sanda Klein MD Signature Date/Time: 07/10/2019/12:37:38 PM    Final     (Echo, Carotid, EGD, Colonoscopy, ERCP)    Subjective:   Discharge Exam: Vitals:   07/10/19 2153 07/11/19 0519  BP: (!) 98/52 (!) 108/58  Pulse: 71 72  Resp: 15 15  Temp: 98.1 F (36.7 C) 98.3 F (36.8 C)  SpO2: 99% 100%   Vitals:   07/10/19 1351 07/10/19 2153 07/11/19 0500 07/11/19 0519  BP: 104/63 (!) 98/52  (!) 108/58  Pulse: 75 71  72  Resp: 17 15  15   Temp: (!) 97.4 F (36.3 C) 98.1 F (36.7 C)  98.3 F (36.8 C)  TempSrc: Oral Oral  Oral  SpO2: 97% 99%  100%  Weight:   70.4 kg   Height:        General: Pt is alert, awake, not in acute distress Cardiovascular: RRR, S1/S2 +, no rubs, no gallops Respiratory: CTA bilaterally, no wheezing, no rhonchi Abdominal: Soft, NT, ND, bowel sounds + Extremities: no edema, no cyanosis    The results of significant diagnostics from this hospitalization (including imaging, microbiology, ancillary and laboratory) are listed below for reference.     Microbiology: Recent Results (from the past 240 hour(s))  SARS Coronavirus 2 by RT PCR (hospital order, performed in [facility] hospital lab) Nasopharyngeal Nasopharyngeal Swab     Status: None   Collection Time: 07/09/19  6:21 PM   Specimen: Nasopharyngeal Swab  Result Value Ref Range Status   SARS Coronavirus 2 NEGATIVE NEGATIVE Final    Comment: (NOTE) SARS-CoV-2 target nucleic acids are NOT DETECTED.  The  SARS-CoV-2 RNA is generally detectable in upper and lower respiratory specimens during the acute phase of infection. The lowest concentration of SARS-CoV-2 viral copies this assay can detect is 250 copies / mL. A negative result does not preclude SARS-CoV-2 infection and should not be used as the sole basis for treatment or other patient management decisions.  A negative result may occur with improper specimen collection / handling, submission of specimen other than nasopharyngeal swab, presence of viral mutation(s) within the areas targeted by this assay, and inadequate number of viral copies (<250 copies / mL). A negative result must be combined with clinical observations, patient history, and epidemiological information.  Fact Sheet for Patients:   StrictlyIdeas.no  Fact Sheet for Healthcare Providers: BankingDealers.co.za  This test is not yet approved or  cleared by the [place] FDA and has been authorized for detection and/or diagnosis of SARS-CoV-2 by FDA under an Emergency Use Authorization (EUA).  This EUA will remain in effect (meaning this test can be used) for the duration of the COVID-19 declaration under Section 564(b)(1) of the Act, 21 U.S.C. section 360bbb-3(b)(1), unless the authorization is terminated or revoked sooner.  Performed at [facility], [place] [address], [place], [place] [address]      Labs: BNP (last 3 results) No results for input(s): BNP in the last 8760 hours. Basic Metabolic Panel: Recent Labs  Lab 07/05/19 0923 07/09/19 1219 07/09/19 1220 07/09/19 1855 07/10/19 0642 07/11/19 0323  NA 151 Repeated and verified X2.*  --  147*  146* 145 139  K 4.3  --  3.8 4.0 3.9 3.9  CL 110  --  112* 114* 114* 109  CO2 29  --  25 22 21* 21*  GLUCOSE 159*  --  120* 129* 93 134*  BUN 63*  --  56* 49* 40* 33*  CREATININE 2.27*  --  1.94* 1.63* 1.43* 1.24*  CALCIUM 9.1  --  8.7* 8.0* 7.9* 8.2*  MG   --  2.3  --   --   --  1.8  PHOS  --  3.5  --   --   --   --    Liver Function Tests: Recent Labs  Lab 07/05/19 0923 07/10/19 0642 07/11/19 0323  AST 19 28 24   ALT 10 11 11   ALKPHOS 23* 18* 18*  BILITOT 0.6 0.7 0.6  PROT 7.0 5.3* 4.6*  ALBUMIN 3.6 2.2* 1.9*   No results for input(s): LIPASE, AMYLASE in the last 168 hours. No results for input(s): AMMONIA in the last 168 hours. CBC: Recent Labs  Lab 07/05/19 0923 07/09/19 1220 07/10/19 0642 07/11/19 0323  WBC 12.0* 10.5 11.8* 9.2  NEUTROABS 9.5* 8.1*  --   --   HGB 13.2 11.9* 12.0 10.4*  HCT 40.1 37.8 37.4 32.4*  MCV 98.0 100.8* 100.3* 98.5  PLT 205.0 136* 142* 126*   Cardiac Enzymes: No results for input(s): CKTOTAL, CKMB, CKMBINDEX, TROPONINI in the last 168 hours. BNP: Invalid input(s): POCBNP CBG: No results for input(s): GLUCAP in the last 168 hours. D-Dimer No results for input(s): DDIMER in the last 72 hours. Hgb A1c No results for input(s): HGBA1C in the last 72 hours. Lipid Profile No results for input(s): CHOL, HDL, LDLCALC, TRIG, CHOLHDL, LDLDIRECT in the last 72 hours. Thyroid function studies Recent Labs    07/09/19 1855  TSH 2.731   Anemia work up Recent Labs    07/09/19 1855  VITAMINB12 727  FOLATE 15.9   Urinalysis    Component Value Date/Time   COLORURINE YELLOW 07/09/2019 1444   APPEARANCEUR HAZY (A) 07/09/2019 1444   LABSPEC 1.025 07/09/2019 1444   PHURINE 5.0 07/09/2019 1444   GLUCOSEU NEGATIVE 07/09/2019 1444   HGBUR NEGATIVE 07/09/2019 1444   BILIRUBINUR NEGATIVE 07/09/2019 1444   KETONESUR NEGATIVE 07/09/2019 1444   PROTEINUR NEGATIVE 07/09/2019 1444   NITRITE NEGATIVE 07/09/2019 1444   LEUKOCYTESUR NEGATIVE 07/09/2019 1444   Sepsis Labs Invalid input(s): PROCALCITONIN,  WBC,  LACTICIDVEN Microbiology Recent Results (from the past 240 hour(s))  SARS Coronavirus 2 by RT PCR (hospital order, performed in [facility] hospital lab) Nasopharyngeal Nasopharyngeal Swab      Status: None   Collection Time: 07/09/19  6:21 PM   Specimen: Nasopharyngeal Swab  Result Value Ref Range Status   SARS Coronavirus 2 NEGATIVE NEGATIVE Final    Comment: (NOTE) SARS-CoV-2 target nucleic acids are NOT DETECTED.  The SARS-CoV-2 RNA is generally detectable in upper and lower respiratory specimens during the acute phase of infection. The lowest concentration of SARS-CoV-2 viral copies this assay can detect is 250 copies / mL. A negative result does not preclude SARS-CoV-2 infection and should not be used as the sole basis for treatment or other patient management decisions.  A negative result may occur with improper specimen collection / handling, submission of specimen other than nasopharyngeal swab, presence of viral mutation(s) within the areas targeted by this assay, and inadequate number of viral copies (<250 copies / mL). A negative result must be combined with clinical observations, patient  history, and epidemiological information.  Fact Sheet for Patients:   StrictlyIdeas.no  Fact Sheet for Healthcare Providers: BankingDealers.co.za  This test is not yet approved or  cleared by the [place] FDA and has been authorized for detection and/or diagnosis of SARS-CoV-2 by FDA under an Emergency Use Authorization (EUA).  This EUA will remain in effect (meaning this test can be used) for the duration of the COVID-19 declaration under Section 564(b)(1) of the Act, 21 U.S.C. section 360bbb-3(b)(1), unless the authorization is terminated or revoked sooner.  Performed at [facility], [place] [address] [place], [place] [address]      Time coordinating discharge: 32 minutes  SIGNED:   Hosie Poisson, MD  [facility] 07/11/2019, 2:43 PM

## 2019-07-11 NOTE — Plan of Care (Signed)
  Problem: Education: Goal: Knowledge of General Education information will improve Description Including pain rating scale, medication(s)/side effects and non-pharmacologic comfort measures Outcome: Progressing   Problem: Health Behavior/Discharge Planning: Goal: Ability to manage health-related needs will improve Outcome: Progressing   Problem: Clinical Measurements: Goal: Ability to maintain clinical measurements within normal limits will improve Outcome: Progressing Goal: Will remain free from infection Outcome: Progressing Goal: Diagnostic test results will improve Outcome: Progressing   Problem: Activity: Goal: Risk for activity intolerance will decrease Outcome: Progressing   Problem: Nutrition: Goal: Adequate nutrition will be maintained Outcome: Progressing   Problem: Coping: Goal: Level of anxiety will decrease Outcome: Progressing   Problem: Elimination: Goal: Will not experience complications related to bowel motility Outcome: Progressing   Problem: Pain Managment: Goal: General experience of comfort will improve Outcome: Progressing   Problem: Safety: Goal: Ability to remain free from injury will improve Outcome: Progressing   

## 2019-07-12 ENCOUNTER — Other Ambulatory Visit: Payer: Self-pay | Admitting: *Deleted

## 2019-07-12 ENCOUNTER — Telehealth: Payer: Self-pay | Admitting: Family Medicine

## 2019-07-12 DIAGNOSIS — I10 Essential (primary) hypertension: Secondary | ICD-10-CM | POA: Diagnosis not present

## 2019-07-12 DIAGNOSIS — E860 Dehydration: Secondary | ICD-10-CM | POA: Diagnosis not present

## 2019-07-12 DIAGNOSIS — R2681 Unsteadiness on feet: Secondary | ICD-10-CM | POA: Diagnosis not present

## 2019-07-12 DIAGNOSIS — R2689 Other abnormalities of gait and mobility: Secondary | ICD-10-CM | POA: Diagnosis not present

## 2019-07-12 DIAGNOSIS — M6258 Muscle wasting and atrophy, not elsewhere classified, other site: Secondary | ICD-10-CM | POA: Diagnosis not present

## 2019-07-12 DIAGNOSIS — W19XXXA Unspecified fall, initial encounter: Secondary | ICD-10-CM | POA: Diagnosis not present

## 2019-07-12 DIAGNOSIS — D649 Anemia, unspecified: Secondary | ICD-10-CM | POA: Diagnosis not present

## 2019-07-12 DIAGNOSIS — R410 Disorientation, unspecified: Secondary | ICD-10-CM | POA: Diagnosis not present

## 2019-07-12 DIAGNOSIS — Z7401 Bed confinement status: Secondary | ICD-10-CM | POA: Diagnosis not present

## 2019-07-12 DIAGNOSIS — N179 Acute kidney failure, unspecified: Secondary | ICD-10-CM | POA: Diagnosis not present

## 2019-07-12 DIAGNOSIS — F0390 Unspecified dementia without behavioral disturbance: Secondary | ICD-10-CM | POA: Diagnosis not present

## 2019-07-12 DIAGNOSIS — D696 Thrombocytopenia, unspecified: Secondary | ICD-10-CM | POA: Diagnosis not present

## 2019-07-12 DIAGNOSIS — M2550 Pain in unspecified joint: Secondary | ICD-10-CM | POA: Diagnosis not present

## 2019-07-12 DIAGNOSIS — I5189 Other ill-defined heart diseases: Secondary | ICD-10-CM | POA: Diagnosis not present

## 2019-07-12 DIAGNOSIS — N289 Disorder of kidney and ureter, unspecified: Secondary | ICD-10-CM | POA: Diagnosis not present

## 2019-07-12 DIAGNOSIS — L89323 Pressure ulcer of left buttock, stage 3: Secondary | ICD-10-CM | POA: Diagnosis not present

## 2019-07-12 DIAGNOSIS — L89313 Pressure ulcer of right buttock, stage 3: Secondary | ICD-10-CM | POA: Diagnosis not present

## 2019-07-12 DIAGNOSIS — R5381 Other malaise: Secondary | ICD-10-CM | POA: Diagnosis not present

## 2019-07-12 DIAGNOSIS — R1312 Dysphagia, oropharyngeal phase: Secondary | ICD-10-CM | POA: Diagnosis not present

## 2019-07-12 DIAGNOSIS — L89150 Pressure ulcer of sacral region, unstageable: Secondary | ICD-10-CM | POA: Diagnosis not present

## 2019-07-12 DIAGNOSIS — E039 Hypothyroidism, unspecified: Secondary | ICD-10-CM | POA: Diagnosis not present

## 2019-07-12 DIAGNOSIS — R55 Syncope and collapse: Secondary | ICD-10-CM | POA: Diagnosis not present

## 2019-07-12 DIAGNOSIS — R41841 Cognitive communication deficit: Secondary | ICD-10-CM | POA: Diagnosis not present

## 2019-07-12 DIAGNOSIS — M6281 Muscle weakness (generalized): Secondary | ICD-10-CM | POA: Diagnosis not present

## 2019-07-12 DIAGNOSIS — I951 Orthostatic hypotension: Secondary | ICD-10-CM | POA: Diagnosis not present

## 2019-07-12 LAB — SARS CORONAVIRUS 2 (TAT 6-24 HRS): SARS Coronavirus 2: NEGATIVE

## 2019-07-12 MED ORDER — MIDODRINE HCL 5 MG PO TABS: 2.5000 mg | ORAL_TABLET | Freq: Two times a day (BID) | ORAL | Status: DC

## 2019-07-12 MED ORDER — MIDODRINE HCL 2.5 MG PO TABS: 2.5000 mg | ORAL_TABLET | Freq: Two times a day (BID) | ORAL | 0 refills | Status: DC

## 2019-07-12 NOTE — Social Work (Signed)
Clinical Social Worker facilitated patient discharge including contacting patient family and facility to confirm patient discharge plans.  Clinical information faxed to facility and family agreeable with plan.  CSW arranged ambulance transport via [facility] to [facility] RN to call [phone]  with report prior to discharge.  Clinical Social Worker will sign off for now as social work intervention is no longer needed. Please consult us again if new need arises.  Mervil Wacker, MSW, LCSW Clinical Social Worker   

## 2019-07-12 NOTE — Telephone Encounter (Signed)
Home health form placed in bin up front for Dr. Birdie Riddle

## 2019-07-12 NOTE — TOC Transition Note (Signed)
Transition of Care [facility]) - CM/SW Discharge Note   Patient Details  Name: ANISHA STARLIPER MRN: [id number] Date of Birth: 1931-06-30  Transition of Care [facility]) CM/SW Contact:  Alexander Mt, LCSW Phone Number: 07/12/2019, 11:16 AM   Clinical Narrative:    Pt auth received for [facility].  Prior auth ref #[id number] Approved for 7/1-7/6.   Spoke with pt daughter [name] via telephone, she is aware and agreeable. All paperwork sent to SNF. DNR signed on chart, no controlled medications noted by this Probation officer. [facility] tentatively scheduled for 1:30pm.   Final next level of care: [place] Barriers to Discharge: Barriers Resolved   Patient Goals and CMS Choice Patient states their goals for this hospitalization and ongoing recovery are:: for her to get stronger and be able to return home CMS Medicare.gov Compare Post Acute Care list provided to:: Patient Represenative (must comment) (pt daughter [name]) Choice offered to / list presented to : Adult Children  Discharge Placement PASRR number recieved: 07/10/19            Patient chooses bed at: [facility] Patient to be transferred to facility by: [facility] Name of family member notified: pt daughter [name] Patient and family notified of of transfer: 07/12/19  Discharge Plan and Services In-house Referral: Clinical Social Work Discharge Planning Services: CM Consult Post Acute Care Choice: [place], Durable Medical Equipment           Readmission Risk Interventions Readmission Risk Prevention Plan 07/10/2019  Post Dischage Appt Not Complete  Appt Comments plan for SNF  Medication Screening Complete  Transportation Screening Complete  Some recent data might be hidden

## 2019-07-12 NOTE — Telephone Encounter (Signed)
Paperwork given to PCP.  

## 2019-07-12 NOTE — Discharge Summary (Signed)
Physician Discharge Summary  EBONEE STOBER LHT:[id number] DOB: [birth year] DOA: 07/09/2019  PCP: Midge Minium, MD  Admit date: 07/09/2019 Discharge date: 07/12/2019  Admitted From: HOME.  Disposition:  SNF   Recommendations for Outpatient Follow-up:  1. Follow up with PCP in 1-2 days .  2. Please obtain BMP/CBC in one week   Discharge Condition: guarded.  CODE STATUS:DNR.  Diet recommendation: Heart Healthy    Brief/Interim Summary:  86 year old lady prior history of hypertension, hyperlipidemia, hypothyroidism, dementia came in for evaluation of near syncopal episode. On arrival to ED she was found to have borderline blood pressure parameters and orthostatic on PT evaluation.   Discharge Diagnoses:  Principal Problem:   Orthostatic hypotension Active Problems:   Dementia (HCC)   Hypothyroidism   Acute on chronic kidney failure (HCC)   Macrocytic anemia   Thrombocytopenia (HCC)   Pressure injury of skin  Near syncope due to orthostatic hypotension Probably secondary to decreased p.o. intake and dehydration.Patient's B12, TSH and folate were within normal limits. 2D echocardiogram shows Left ventricular ejection fraction, by estimation, is 60 to 65%. The  left ventricle has normal function. The left ventricle has no regional  wall motion abnormalities. There is mild concentric left ventricular  hypertrophy. Left ventricular diastolic  parameters are consistent with Grade I diastolic dysfunction (impaired relaxation).   She was started on Midodrine and ted hoses ordered.  Pt is asymptomatic , though her orthostatics were positive.    Dementia Continue with Namenda.   AKI Probably secondary to dehydration and poor oral intake. Improving with IV fluids. Creatinine at 1.24. recommend checking BMP in 1 to 2 days.    Hypernatremia Secondary to free water deficit from poor oral intake Resolved with IV fluids.   Mild normocytic anemia and mild  thrombocytopenia  Platelets at 126000 and hemoglobin around 10.4, continue to monitor them  Hypothyroidism Continue with Synthroid.  Pressure injury present on admission Pressure Injury 07/09/19 Buttocks Left;Right;Medial Stage 2 -  Partial thickness loss of dermis presenting as a shallow open injury with a red, pink wound bed without slough. (Active)  07/09/19 2326  Location: Buttocks  Location Orientation: Left;Right;Medial  Staging: Stage 2 -  Partial thickness loss of dermis presenting as a shallow open injury with a red, pink wound bed without slough.  Wound Description (Comments):   Present on Admission: Yes     Pressure Injury 07/09/19 Buttocks Left Stage 2 -  Partial thickness loss of dermis presenting as a shallow open injury with a red, pink wound bed without slough. (Active)  07/09/19 2313  Location: Buttocks  Location Orientation: Left  Staging: Stage 2 -  Partial thickness loss of dermis presenting as a shallow open injury with a red, pink wound bed without slough.  Wound Description (Comments):   Present on Admission: Yes     Pressure Injury 07/09/19 Sacrum (Active)  07/09/19 2313  Location: Sacrum  Location Orientation:   Staging:   Wound Description (Comments):   Present on Admission:     Wound care will be consulted.   Discharge Instructions   Allergies as of 07/12/2019      Reactions   Bee Venom Shortness Of Breath, Itching, Rash, Other (See Comments)   Almost passed out, also   Penicillins Rash   Has patient had a PCN reaction causing immediate rash, facial/tongue/throat swelling, SOB or lightheadedness with hypotension: Yes Has patient had a PCN reaction causing severe rash involving mucus membranes or skin necrosis: No Has patient had a  PCN reaction that required hospitalization: Unknown Has patient had a PCN reaction occurring within the last 10 years: No If all of the above answers are "NO", then may proceed with Cephalosporin use.       Medication List    STOP taking these medications   naproxen sodium 220 MG tablet Commonly known as: ALEVE   triamcinolone ointment 0.1 % Commonly known as: KENALOG     TAKE these medications   [employer] D3 50 MCG (2000 UT) Caps Generic drug: Cholecalciferol TAKE 1 CAPSULE DAILY What changed: how much to take   diphenhydrAMINE 25 MG tablet Commonly known as: BENADRYL Take 2 tablets (50 mg total) by mouth every 6 (six) hours as needed for itching (or hives).   EPINEPHrine 0.3 mg/0.3 mL Soaj injection Commonly known as: EPI-PEN Inject 0.3 mLs (0.3 mg total) into the muscle once as needed for up to 1 dose (severe allergic reaction).   feeding supplement (ENSURE ENLIVE) Liqd Take 237 mLs by mouth 2 (two) times daily between meals.   fluticasone 50 MCG/ACT nasal spray Commonly known as: FLONASE Place 2 sprays into both nostrils daily as needed (for seasonal allergies). SPRAY 2 SPRAYS INTO EACH NOSTRIL EVERY DAY   levothyroxine 75 MCG tablet Commonly known as: SYNTHROID TAKE 1 TABLET BY MOUTH EVERY DAY What changed: when to take this   loperamide 2 MG capsule Commonly known as: IMODIUM Take 2 mg by mouth at bedtime.   memantine 10 MG tablet Commonly known as: NAMENDA TAKE 1 TABLET BY MOUTH TWICE A DAY. What changed:   how much to take  how to take this  when to take this  additional instructions   midodrine 2.5 MG tablet Commonly known as: PROAMATINE Take 1 tablet (2.5 mg total) by mouth 2 (two) times daily with a meal.   multivitamin with minerals Tabs tablet Take 1 tablet by mouth daily.       Contact information for follow-up providers    Midge Minium, MD. Schedule an appointment as soon as possible for a visit in 1 week(s).   Specialty: Family Medicine Contact information: [address] [place] [address] [phone]            Contact information for after-discharge care    Destination    [facility] Preferred SNF .    Service: Skilled Nursing Contact information: [place] [address] [phone]                 Allergies  Allergen Reactions  . Bee Venom Shortness Of Breath, Itching, Rash and Other (See Comments)    Almost passed out, also  . Penicillins Rash    Has patient had a PCN reaction causing immediate rash, facial/tongue/throat swelling, SOB or lightheadedness with hypotension: Yes Has patient had a PCN reaction causing severe rash involving mucus membranes or skin necrosis: No Has patient had a PCN reaction that required hospitalization: Unknown Has patient had a PCN reaction occurring within the last 10 years: No If all of the above answers are "NO", then may proceed with Cephalosporin use.     Consultations:  Wound care     Procedures/Studies: ECHOCARDIOGRAM COMPLETE  Result Date: 07/10/2019    ECHOCARDIOGRAM REPORT   Patient Name:   FUMI GUADRON Date of Exam: 07/10/2019 Medical Rec #:  [id number]         Height:       62.0 in Accession #:    [id number]  Weight:       152.3 lb Date of Birth:  Aug 20, 1931        BSA:          1.703 m Patient Age:    83 years          BP:           138/58 mmHg Patient Gender: F                 HR:           87 bpm. Exam Location:  Inpatient Procedure: 2D Echo Indications:    780.2 syncope  History:        Patient has no prior history of Echocardiogram examinations.                 Risk Factors:Hypertension.  Sonographer:    Jannett Celestine RDCS (AE) Referring Phys: [id number] Mckinley Jewel  Sonographer Comments: limited mobilty. IMPRESSIONS  1. Left ventricular ejection fraction, by estimation, is 60 to 65%. The left ventricle has normal function. The left ventricle has no regional wall motion abnormalities. There is mild concentric left ventricular hypertrophy. Left ventricular diastolic parameters are consistent with Grade I diastolic dysfunction (impaired relaxation).  2. Right ventricular systolic function is  normal. The right ventricular size is normal.  3. The mitral valve is normal in structure. No evidence of mitral valve regurgitation. No evidence of mitral stenosis.  4. The aortic valve is normal in structure. Aortic valve regurgitation is not visualized. No aortic stenosis is present.  5. The inferior vena cava is normal in size with greater than 50% respiratory variability, suggesting right atrial pressure of 3 mmHg. FINDINGS  Left Ventricle: Left ventricular ejection fraction, by estimation, is 60 to 65%. The left ventricle has normal function. The left ventricle has no regional wall motion abnormalities. The left ventricular internal cavity size was normal in size. There is  mild concentric left ventricular hypertrophy. Left ventricular diastolic parameters are consistent with Grade I diastolic dysfunction (impaired relaxation). Indeterminate filling pressures. Right Ventricle: The right ventricular size is normal. No increase in right ventricular wall thickness. Right ventricular systolic function is normal. Left Atrium: Left atrial size was normal in size. Right Atrium: Right atrial size was normal in size. Pericardium: A small pericardial effusion is present. The pericardial effusion is circumferential. There is no evidence of cardiac tamponade. Presence of pericardial fat pad. Mitral Valve: The mitral valve is normal in structure. Normal mobility of the mitral valve leaflets. No evidence of mitral valve regurgitation. No evidence of mitral valve stenosis. Tricuspid Valve: The tricuspid valve is normal in structure. Tricuspid valve regurgitation is not demonstrated. No evidence of tricuspid stenosis. Aortic Valve: The aortic valve is normal in structure. Aortic valve regurgitation is not visualized. No aortic stenosis is present. Pulmonic Valve: The pulmonic valve was normal in structure. Pulmonic valve regurgitation is not visualized. No evidence of pulmonic stenosis. Aorta: The aortic root is normal in  size and structure. Venous: The inferior vena cava is normal in size with greater than 50% respiratory variability, suggesting right atrial pressure of 3 mmHg. IAS/Shunts: No atrial level shunt detected by color flow Doppler.  LEFT VENTRICLE PLAX 2D LVIDd:         3.20 cm LVIDs:         2.60 cm LV PW:         1.20 cm LV IVS:        1.20 cm LVOT diam:  2.10 cm LV SV:         32 LV SV Index:   19 LVOT Area:     3.46 cm  LEFT ATRIUM         Index LA diam:    2.50 cm 1.47 cm/m  AORTIC VALVE LVOT Vmax:   60.70 cm/s LVOT Vmean:  37.900 cm/s LVOT VTI:    0.093 m  AORTA Ao Root diam: 2.70 cm MITRAL VALVE MV Area (PHT): 4.15 cm    SHUNTS MV Decel Time: 183 msec    Systemic VTI:  0.09 m MV E velocity: 50.60 cm/s  Systemic Diam: 2.10 cm MV A velocity: 65.10 cm/s MV E/A ratio:  0.78 Mihai Croitoru MD Electronically signed by Sanda Klein MD Signature Date/Time: 07/10/2019/12:37:38 PM    Final       Subjective: NO NEW COMPLAINTS.   Discharge Exam: Vitals:   07/12/19 0520 07/12/19 1054  BP: 117/66 132/67  Pulse: 83 79  Resp:  19  Temp: 97.9 F (36.6 C) 97.9 F (36.6 C)  SpO2: 98% 98%   Vitals:   07/11/19 2026 07/12/19 0500 07/12/19 0520 07/12/19 1054  BP: (!) 128/54  117/66 132/67  Pulse: 91  83 79  Resp: 16   19  Temp: 98.5 F (36.9 C)  97.9 F (36.6 C) 97.9 F (36.6 C)  TempSrc: Oral  Oral Oral  SpO2: 98%  98% 98%  Weight:  71.5 kg    Height:        General: Pt is alert, awake, not in acute distress Cardiovascular: RRR, S1/S2 +, no rubs, no gallops Respiratory: CTA bilaterally, no wheezing, no rhonchi Abdominal: Soft, NT, ND, bowel sounds + Extremities: no edema, no cyanosis    The results of significant diagnostics from this hospitalization (including imaging, microbiology, ancillary and laboratory) are listed below for reference.     Microbiology: Recent Results (from the past 240 hour(s))  SARS Coronavirus 2 by RT PCR (hospital order, performed in [facility] hospital  lab) Nasopharyngeal Nasopharyngeal Swab     Status: None   Collection Time: 07/09/19  6:21 PM   Specimen: Nasopharyngeal Swab  Result Value Ref Range Status   SARS Coronavirus 2 NEGATIVE NEGATIVE Final    Comment: (NOTE) SARS-CoV-2 target nucleic acids are NOT DETECTED.  The SARS-CoV-2 RNA is generally detectable in upper and lower respiratory specimens during the acute phase of infection. The lowest concentration of SARS-CoV-2 viral copies this assay can detect is 250 copies / mL. A negative result does not preclude SARS-CoV-2 infection and should not be used as the sole basis for treatment or other patient management decisions.  A negative result may occur with improper specimen collection / handling, submission of specimen other than nasopharyngeal swab, presence of viral mutation(s) within the areas targeted by this assay, and inadequate number of viral copies (<250 copies / mL). A negative result must be combined with clinical observations, patient history, and epidemiological information.  Fact Sheet for Patients:   StrictlyIdeas.no  Fact Sheet for Healthcare Providers: BankingDealers.co.za  This test is not yet approved or  cleared by the [place] FDA and has been authorized for detection and/or diagnosis of SARS-CoV-2 by FDA under an Emergency Use Authorization (EUA).  This EUA will remain in effect (meaning this test can be used) for the duration of the COVID-19 declaration under Section 564(b)(1) of the Act, 21 U.S.C. section 360bbb-3(b)(1), unless the authorization is terminated or revoked sooner.  Performed at [facility], [address] [place], [place] [address]   SARS CORONAVIRUS 2 (TAT 6-24 HRS) Nasopharyngeal Nasopharyngeal Swab     Status: None   Collection Time: 07/11/19 11:53 PM   Specimen: Nasopharyngeal Swab  Result Value Ref Range Status   SARS Coronavirus 2 NEGATIVE NEGATIVE Final    Comment:  (NOTE) SARS-CoV-2 target nucleic acids are NOT DETECTED.  The SARS-CoV-2 RNA is generally detectable in upper and lower respiratory specimens during the acute phase of infection. Negative results do not preclude SARS-CoV-2 infection, do not rule out co-infections with other pathogens, and should not be used as the sole basis for treatment or other patient management decisions. Negative results must be combined with clinical observations, patient history, and epidemiological information. The expected result is Negative.  Fact Sheet for Patients: SugarRoll.be  Fact Sheet for Healthcare Providers: https://www.woods-mathews.com/  This test is not yet approved or cleared by the [place] FDA and  has been authorized for detection and/or diagnosis of SARS-CoV-2 by FDA under an Emergency Use Authorization (EUA). This EUA will remain  in effect (meaning this test can be used) for the duration of the COVID-19 declaration under Se ction 564(b)(1) of the Act, 21 U.S.C. section 360bbb-3(b)(1), unless the authorization is terminated or revoked sooner.  Performed at [facility], [place] [address] [place], [place] [address]      Labs: BNP (last 3 results) No results for input(s): BNP in the last 8760 hours. Basic Metabolic Panel: Recent Labs  Lab 07/09/19 1219 07/09/19 1220 07/09/19 1855 07/10/19 0642 07/11/19 0323  NA  --  147* 146* 145 139  K  --  3.8 4.0 3.9 3.9  CL  --  112* 114* 114* 109  CO2  --  25 22 21* 21*  GLUCOSE  --  120* 129* 93 134*  BUN  --  56* 49* 40* 33*  CREATININE  --  1.94* 1.63* 1.43* 1.24*  CALCIUM  --  8.7* 8.0* 7.9* 8.2*  MG 2.3  --   --   --  1.8  PHOS 3.5  --   --   --   --    Liver Function Tests: Recent Labs  Lab 07/10/19 0642 07/11/19 0323  AST 28 24  ALT 11 11  ALKPHOS 18* 18*  BILITOT 0.7 0.6  PROT 5.3* 4.6*  ALBUMIN 2.2* 1.9*   No results for input(s): LIPASE, AMYLASE in the last 168  hours. No results for input(s): AMMONIA in the last 168 hours. CBC: Recent Labs  Lab 07/09/19 1220 07/10/19 0642 07/11/19 0323  WBC 10.5 11.8* 9.2  NEUTROABS 8.1*  --   --   HGB 11.9* 12.0 10.4*  HCT 37.8 37.4 32.4*  MCV 100.8* 100.3* 98.5  PLT 136* 142* 126*   Cardiac Enzymes: No results for input(s): CKTOTAL, CKMB, CKMBINDEX, TROPONINI in the last 168 hours. BNP: Invalid input(s): POCBNP CBG: No results for input(s): GLUCAP in the last 168 hours. D-Dimer No results for input(s): DDIMER in the last 72 hours. Hgb A1c No results for input(s): HGBA1C in the last 72 hours. Lipid Profile No results for input(s): CHOL, HDL, LDLCALC, TRIG, CHOLHDL, LDLDIRECT in the last 72 hours. Thyroid function studies Recent Labs    07/09/19 1855  TSH 2.731   Anemia work up Recent Labs    07/09/19 1855  VITAMINB12 727  FOLATE 15.9   Urinalysis    Component Value Date/Time   COLORURINE YELLOW 07/09/2019 1444   APPEARANCEUR HAZY (A) 07/09/2019 1444   LABSPEC  1.025 07/09/2019 1444   PHURINE 5.0 07/09/2019 1444   GLUCOSEU NEGATIVE 07/09/2019 1444   HGBUR NEGATIVE 07/09/2019 1444   BILIRUBINUR NEGATIVE 07/09/2019 1444   KETONESUR NEGATIVE 07/09/2019 1444   PROTEINUR NEGATIVE 07/09/2019 1444   NITRITE NEGATIVE 07/09/2019 1444   LEUKOCYTESUR NEGATIVE 07/09/2019 1444   Sepsis Labs Invalid input(s): PROCALCITONIN,  WBC,  LACTICIDVEN Microbiology Recent Results (from the past 240 hour(s))  SARS Coronavirus 2 by RT PCR (hospital order, performed in [facility] hospital lab) Nasopharyngeal Nasopharyngeal Swab     Status: None   Collection Time: 07/09/19  6:21 PM   Specimen: Nasopharyngeal Swab  Result Value Ref Range Status   SARS Coronavirus 2 NEGATIVE NEGATIVE Final    Comment: (NOTE) SARS-CoV-2 target nucleic acids are NOT DETECTED.  The SARS-CoV-2 RNA is generally detectable in upper and lower respiratory specimens during the acute phase of infection. The lowest concentration  of SARS-CoV-2 viral copies this assay can detect is 250 copies / mL. A negative result does not preclude SARS-CoV-2 infection and should not be used as the sole basis for treatment or other patient management decisions.  A negative result may occur with improper specimen collection / handling, submission of specimen other than nasopharyngeal swab, presence of viral mutation(s) within the areas targeted by this assay, and inadequate number of viral copies (<250 copies / mL). A negative result must be combined with clinical observations, patient history, and epidemiological information.  Fact Sheet for Patients:   StrictlyIdeas.no  Fact Sheet for Healthcare Providers: BankingDealers.co.za  This test is not yet approved or  cleared by the [place] FDA and has been authorized for detection and/or diagnosis of SARS-CoV-2 by FDA under an Emergency Use Authorization (EUA).  This EUA will remain in effect (meaning this test can be used) for the duration of the COVID-19 declaration under Section 564(b)(1) of the Act, 21 U.S.C. section 360bbb-3(b)(1), unless the authorization is terminated or revoked sooner.  Performed at [facility], [place] [address], [place], [place] [address]   SARS CORONAVIRUS 2 (TAT 6-24 HRS) Nasopharyngeal Nasopharyngeal Swab     Status: None   Collection Time: 07/11/19 11:53 PM   Specimen: Nasopharyngeal Swab  Result Value Ref Range Status   SARS Coronavirus 2 NEGATIVE NEGATIVE Final    Comment: (NOTE) SARS-CoV-2 target nucleic acids are NOT DETECTED.  The SARS-CoV-2 RNA is generally detectable in upper and lower respiratory specimens during the acute phase of infection. Negative results do not preclude SARS-CoV-2 infection, do not rule out co-infections with other pathogens, and should not be used as the sole basis for treatment or other patient management decisions. Negative results must be combined with  clinical observations, patient history, and epidemiological information. The expected result is Negative.  Fact Sheet for Patients: SugarRoll.be  Fact Sheet for Healthcare Providers: https://www.woods-mathews.com/  This test is not yet approved or cleared by the [place] FDA and  has been authorized for detection and/or diagnosis of SARS-CoV-2 by FDA under an Emergency Use Authorization (EUA). This EUA will remain  in effect (meaning this test can be used) for the duration of the COVID-19 declaration under Se ction 564(b)(1) of the Act, 21 U.S.C. section 360bbb-3(b)(1), unless the authorization is terminated or revoked sooner.  Performed at [facility], [place] [address] [place], [place] [address]      Time coordinating discharge: 32 minutes  SIGNED:   Hosie Poisson, MD  [facility] 07/12/2019, 11:42 AM

## 2019-07-12 NOTE — Consult Note (Signed)
WOC Nurse Consult Note: Reason for Consult: pressure injury Wound type: stage 2 Pressure injury Pressure Injury POA: Yes Wound RCV:[id number] thickness skin loss per bedside nurse (3 small areas) ; non necrotic  Dressing procedure/placement/frequency: Continue silicone foam per nursing skin care order set. Change every 3 days; lift to inspect under dressing each shift for acute changes.    Re consult if needed, will not follow at this time. Thanks  Anishka Bushard [employer], RN,CWOCN, CNS, [place] [phone])

## 2019-07-13 DIAGNOSIS — E039 Hypothyroidism, unspecified: Secondary | ICD-10-CM | POA: Diagnosis not present

## 2019-07-13 DIAGNOSIS — E781 Pure hyperglyceridemia: Secondary | ICD-10-CM

## 2019-07-13 DIAGNOSIS — I10 Essential (primary) hypertension: Secondary | ICD-10-CM | POA: Diagnosis not present

## 2019-07-13 DIAGNOSIS — L89313 Pressure ulcer of right buttock, stage 3: Secondary | ICD-10-CM | POA: Diagnosis not present

## 2019-07-13 DIAGNOSIS — F0280 Dementia in other diseases classified elsewhere without behavioral disturbance: Secondary | ICD-10-CM

## 2019-07-13 DIAGNOSIS — L89323 Pressure ulcer of left buttock, stage 3: Secondary | ICD-10-CM | POA: Diagnosis not present

## 2019-07-13 NOTE — Telephone Encounter (Signed)
Form completed and placed in basket  

## 2019-07-13 NOTE — Telephone Encounter (Signed)
FYI

## 2019-07-17 DIAGNOSIS — R55 Syncope and collapse: Secondary | ICD-10-CM | POA: Diagnosis not present

## 2019-07-17 DIAGNOSIS — F0390 Unspecified dementia without behavioral disturbance: Secondary | ICD-10-CM | POA: Diagnosis not present

## 2019-07-17 DIAGNOSIS — E860 Dehydration: Secondary | ICD-10-CM | POA: Diagnosis not present

## 2019-07-17 DIAGNOSIS — I951 Orthostatic hypotension: Secondary | ICD-10-CM | POA: Diagnosis not present

## 2019-07-17 NOTE — Telephone Encounter (Signed)
Faxed form to [facility]

## 2019-07-19 NOTE — Patient Outreach (Signed)
[facility]) Care Management  07/19/2019  Kristina [name] 1931/04/05 [id number]   Late entry. Pt was in SNF when referral was made. PAC nurse to notify me if pt is discharged home.  Eulah Pont. Myrtie Neither, MSN, [facility] Gerontological Nurse Practitioner [facility] Care Management [phone]

## 2019-07-20 DIAGNOSIS — I951 Orthostatic hypotension: Secondary | ICD-10-CM | POA: Diagnosis not present

## 2019-07-20 DIAGNOSIS — N289 Disorder of kidney and ureter, unspecified: Secondary | ICD-10-CM | POA: Diagnosis not present

## 2019-07-20 DIAGNOSIS — D649 Anemia, unspecified: Secondary | ICD-10-CM | POA: Diagnosis not present

## 2019-07-23 DIAGNOSIS — L89150 Pressure ulcer of sacral region, unstageable: Secondary | ICD-10-CM | POA: Diagnosis not present

## 2019-07-23 DIAGNOSIS — L89323 Pressure ulcer of left buttock, stage 3: Secondary | ICD-10-CM | POA: Diagnosis not present

## 2019-07-24 DIAGNOSIS — D696 Thrombocytopenia, unspecified: Secondary | ICD-10-CM | POA: Diagnosis not present

## 2019-07-24 DIAGNOSIS — N289 Disorder of kidney and ureter, unspecified: Secondary | ICD-10-CM | POA: Diagnosis not present

## 2019-07-24 DIAGNOSIS — I951 Orthostatic hypotension: Secondary | ICD-10-CM | POA: Diagnosis not present

## 2019-07-24 DIAGNOSIS — I5189 Other ill-defined heart diseases: Secondary | ICD-10-CM | POA: Diagnosis not present

## 2019-07-25 DIAGNOSIS — W19XXXA Unspecified fall, initial encounter: Secondary | ICD-10-CM | POA: Diagnosis not present

## 2019-07-25 DIAGNOSIS — I951 Orthostatic hypotension: Secondary | ICD-10-CM | POA: Diagnosis not present

## 2019-07-25 DIAGNOSIS — F0390 Unspecified dementia without behavioral disturbance: Secondary | ICD-10-CM | POA: Diagnosis not present

## 2019-07-27 DIAGNOSIS — R55 Syncope and collapse: Secondary | ICD-10-CM | POA: Diagnosis not present

## 2019-07-27 DIAGNOSIS — I5189 Other ill-defined heart diseases: Secondary | ICD-10-CM | POA: Diagnosis not present

## 2019-07-27 DIAGNOSIS — I951 Orthostatic hypotension: Secondary | ICD-10-CM | POA: Diagnosis not present

## 2019-07-27 DIAGNOSIS — F0390 Unspecified dementia without behavioral disturbance: Secondary | ICD-10-CM | POA: Diagnosis not present

## 2019-07-29 DIAGNOSIS — E039 Hypothyroidism, unspecified: Secondary | ICD-10-CM | POA: Diagnosis not present

## 2019-07-29 DIAGNOSIS — F0280 Dementia in other diseases classified elsewhere without behavioral disturbance: Secondary | ICD-10-CM | POA: Diagnosis not present

## 2019-07-29 DIAGNOSIS — G629 Polyneuropathy, unspecified: Secondary | ICD-10-CM | POA: Diagnosis not present

## 2019-07-29 DIAGNOSIS — Z8542 Personal history of malignant neoplasm of other parts of uterus: Secondary | ICD-10-CM | POA: Diagnosis not present

## 2019-07-29 DIAGNOSIS — M6258 Muscle wasting and atrophy, not elsewhere classified, other site: Secondary | ICD-10-CM | POA: Diagnosis not present

## 2019-07-29 DIAGNOSIS — K219 Gastro-esophageal reflux disease without esophagitis: Secondary | ICD-10-CM | POA: Diagnosis not present

## 2019-07-29 DIAGNOSIS — D696 Thrombocytopenia, unspecified: Secondary | ICD-10-CM | POA: Diagnosis not present

## 2019-07-29 DIAGNOSIS — N189 Chronic kidney disease, unspecified: Secondary | ICD-10-CM | POA: Diagnosis not present

## 2019-07-29 DIAGNOSIS — Z86010 Personal history of colonic polyps: Secondary | ICD-10-CM | POA: Diagnosis not present

## 2019-07-29 DIAGNOSIS — E785 Hyperlipidemia, unspecified: Secondary | ICD-10-CM | POA: Diagnosis not present

## 2019-07-29 DIAGNOSIS — I951 Orthostatic hypotension: Secondary | ICD-10-CM | POA: Diagnosis not present

## 2019-07-29 DIAGNOSIS — I1310 Hypertensive heart and chronic kidney disease without heart failure, with stage 1 through stage 4 chronic kidney disease, or unspecified chronic kidney disease: Secondary | ICD-10-CM | POA: Diagnosis not present

## 2019-07-29 DIAGNOSIS — R1312 Dysphagia, oropharyngeal phase: Secondary | ICD-10-CM | POA: Diagnosis not present

## 2019-07-29 DIAGNOSIS — R41841 Cognitive communication deficit: Secondary | ICD-10-CM | POA: Diagnosis not present

## 2019-07-29 DIAGNOSIS — H8110 Benign paroxysmal vertigo, unspecified ear: Secondary | ICD-10-CM | POA: Diagnosis not present

## 2019-07-29 DIAGNOSIS — D631 Anemia in chronic kidney disease: Secondary | ICD-10-CM | POA: Diagnosis not present

## 2019-07-30 ENCOUNTER — Telehealth: Payer: Self-pay | Admitting: Family Medicine

## 2019-07-30 DIAGNOSIS — R269 Unspecified abnormalities of gait and mobility: Secondary | ICD-10-CM

## 2019-07-30 NOTE — Telephone Encounter (Signed)
Michelle with [facility] called in asking for verbal orders to see pt for physical therapy 2X this week and 1X a wk for the next 8wks to work on balance and safety.   She also needs a written order for a front wheeled walker to be faxed to [phone]   [facility] to [facility] if no answer on [phone]

## 2019-07-30 NOTE — Telephone Encounter (Signed)
Ok for Verbal orders and for [employer]?

## 2019-07-30 NOTE — Telephone Encounter (Signed)
Ok for both

## 2019-07-30 NOTE — Telephone Encounter (Signed)
Verbal ok given and DME faxed.

## 2019-08-02 ENCOUNTER — Other Ambulatory Visit: Payer: Self-pay | Admitting: Family Medicine

## 2019-08-02 NOTE — Telephone Encounter (Signed)
Please advise if this should be refilled?

## 2019-08-03 ENCOUNTER — Other Ambulatory Visit: Payer: Self-pay | Admitting: Neurology

## 2019-08-08 ENCOUNTER — Encounter (HOSPITAL_COMMUNITY): Payer: Self-pay | Admitting: Pharmacy Technician

## 2019-08-08 ENCOUNTER — Inpatient Hospital Stay (HOSPITAL_COMMUNITY)
Admission: EM | Admit: 2019-08-08 | Discharge: 2019-08-17 | DRG: 312 | Disposition: A | Discharge status: Skilled Nursing Facility | Payer: Medicare Other | Attending: Family Medicine | Admitting: Family Medicine

## 2019-08-08 ENCOUNTER — Emergency Department (HOSPITAL_COMMUNITY): Payer: Medicare Other

## 2019-08-08 ENCOUNTER — Other Ambulatory Visit: Payer: Self-pay

## 2019-08-08 DIAGNOSIS — K219 Gastro-esophageal reflux disease without esophagitis: Secondary | ICD-10-CM | POA: Diagnosis present

## 2019-08-08 DIAGNOSIS — F0390 Unspecified dementia without behavioral disturbance: Secondary | ICD-10-CM | POA: Diagnosis present

## 2019-08-08 DIAGNOSIS — L8990 Pressure ulcer of unspecified site, unspecified stage: Secondary | ICD-10-CM | POA: Diagnosis present

## 2019-08-08 DIAGNOSIS — R609 Edema, unspecified: Secondary | ICD-10-CM | POA: Diagnosis not present

## 2019-08-08 DIAGNOSIS — Z880 Allergy status to penicillin: Secondary | ICD-10-CM | POA: Diagnosis not present

## 2019-08-08 DIAGNOSIS — R404 Transient alteration of awareness: Secondary | ICD-10-CM | POA: Diagnosis not present

## 2019-08-08 DIAGNOSIS — Z20822 Contact with and (suspected) exposure to covid-19: Secondary | ICD-10-CM | POA: Diagnosis present

## 2019-08-08 DIAGNOSIS — Z66 Do not resuscitate: Secondary | ICD-10-CM | POA: Diagnosis present

## 2019-08-08 DIAGNOSIS — I951 Orthostatic hypotension: Secondary | ICD-10-CM | POA: Diagnosis not present

## 2019-08-08 DIAGNOSIS — E039 Hypothyroidism, unspecified: Secondary | ICD-10-CM | POA: Diagnosis present

## 2019-08-08 DIAGNOSIS — Z91030 Bee allergy status: Secondary | ICD-10-CM

## 2019-08-08 DIAGNOSIS — I129 Hypertensive chronic kidney disease with stage 1 through stage 4 chronic kidney disease, or unspecified chronic kidney disease: Secondary | ICD-10-CM | POA: Diagnosis present

## 2019-08-08 DIAGNOSIS — I4891 Unspecified atrial fibrillation: Secondary | ICD-10-CM | POA: Diagnosis present

## 2019-08-08 DIAGNOSIS — R0902 Hypoxemia: Secondary | ICD-10-CM | POA: Diagnosis not present

## 2019-08-08 DIAGNOSIS — I82431 Acute embolism and thrombosis of right popliteal vein: Secondary | ICD-10-CM | POA: Diagnosis present

## 2019-08-08 DIAGNOSIS — Z79890 Hormone replacement therapy (postmenopausal): Secondary | ICD-10-CM

## 2019-08-08 DIAGNOSIS — R2681 Unsteadiness on feet: Secondary | ICD-10-CM | POA: Diagnosis not present

## 2019-08-08 DIAGNOSIS — Z8249 Family history of ischemic heart disease and other diseases of the circulatory system: Secondary | ICD-10-CM | POA: Diagnosis not present

## 2019-08-08 DIAGNOSIS — E7800 Pure hypercholesterolemia, unspecified: Secondary | ICD-10-CM | POA: Diagnosis present

## 2019-08-08 DIAGNOSIS — R41841 Cognitive communication deficit: Secondary | ICD-10-CM | POA: Diagnosis not present

## 2019-08-08 DIAGNOSIS — I82411 Acute embolism and thrombosis of right femoral vein: Secondary | ICD-10-CM | POA: Diagnosis not present

## 2019-08-08 DIAGNOSIS — I82451 Acute embolism and thrombosis of right peroneal vein: Secondary | ICD-10-CM | POA: Diagnosis not present

## 2019-08-08 DIAGNOSIS — N1831 Chronic kidney disease, stage 3a: Secondary | ICD-10-CM | POA: Diagnosis present

## 2019-08-08 DIAGNOSIS — I248 Other forms of acute ischemic heart disease: Secondary | ICD-10-CM | POA: Diagnosis not present

## 2019-08-08 DIAGNOSIS — I82441 Acute embolism and thrombosis of right tibial vein: Secondary | ICD-10-CM | POA: Diagnosis present

## 2019-08-08 DIAGNOSIS — E861 Hypovolemia: Secondary | ICD-10-CM | POA: Diagnosis present

## 2019-08-08 DIAGNOSIS — R778 Other specified abnormalities of plasma proteins: Secondary | ICD-10-CM

## 2019-08-08 DIAGNOSIS — L89322 Pressure ulcer of left buttock, stage 2: Secondary | ICD-10-CM | POA: Diagnosis present

## 2019-08-08 DIAGNOSIS — R55 Syncope and collapse: Secondary | ICD-10-CM | POA: Diagnosis not present

## 2019-08-08 DIAGNOSIS — Z8542 Personal history of malignant neoplasm of other parts of uterus: Secondary | ICD-10-CM | POA: Diagnosis not present

## 2019-08-08 DIAGNOSIS — E870 Hyperosmolality and hypernatremia: Secondary | ICD-10-CM | POA: Diagnosis present

## 2019-08-08 DIAGNOSIS — I82409 Acute embolism and thrombosis of unspecified deep veins of unspecified lower extremity: Secondary | ICD-10-CM

## 2019-08-08 DIAGNOSIS — E860 Dehydration: Secondary | ICD-10-CM

## 2019-08-08 DIAGNOSIS — E785 Hyperlipidemia, unspecified: Secondary | ICD-10-CM | POA: Diagnosis present

## 2019-08-08 DIAGNOSIS — N179 Acute kidney failure, unspecified: Secondary | ICD-10-CM | POA: Diagnosis present

## 2019-08-08 DIAGNOSIS — Z803 Family history of malignant neoplasm of breast: Secondary | ICD-10-CM | POA: Diagnosis not present

## 2019-08-08 DIAGNOSIS — Z8711 Personal history of peptic ulcer disease: Secondary | ICD-10-CM

## 2019-08-08 DIAGNOSIS — R7989 Other specified abnormal findings of blood chemistry: Secondary | ICD-10-CM | POA: Diagnosis not present

## 2019-08-08 DIAGNOSIS — G629 Polyneuropathy, unspecified: Secondary | ICD-10-CM | POA: Diagnosis not present

## 2019-08-08 DIAGNOSIS — M6281 Muscle weakness (generalized): Secondary | ICD-10-CM | POA: Diagnosis not present

## 2019-08-08 DIAGNOSIS — E8809 Other disorders of plasma-protein metabolism, not elsewhere classified: Secondary | ICD-10-CM | POA: Diagnosis present

## 2019-08-08 DIAGNOSIS — R488 Other symbolic dysfunctions: Secondary | ICD-10-CM | POA: Diagnosis not present

## 2019-08-08 DIAGNOSIS — Z79899 Other long term (current) drug therapy: Secondary | ICD-10-CM

## 2019-08-08 DIAGNOSIS — Z7401 Bed confinement status: Secondary | ICD-10-CM | POA: Diagnosis not present

## 2019-08-08 DIAGNOSIS — Z8719 Personal history of other diseases of the digestive system: Secondary | ICD-10-CM

## 2019-08-08 DIAGNOSIS — R410 Disorientation, unspecified: Secondary | ICD-10-CM | POA: Diagnosis not present

## 2019-08-08 DIAGNOSIS — I824Y1 Acute embolism and thrombosis of unspecified deep veins of right proximal lower extremity: Secondary | ICD-10-CM | POA: Diagnosis not present

## 2019-08-08 DIAGNOSIS — I824Y2 Acute embolism and thrombosis of unspecified deep veins of left proximal lower extremity: Secondary | ICD-10-CM | POA: Diagnosis not present

## 2019-08-08 DIAGNOSIS — R4182 Altered mental status, unspecified: Secondary | ICD-10-CM | POA: Diagnosis not present

## 2019-08-08 DIAGNOSIS — R2689 Other abnormalities of gait and mobility: Secondary | ICD-10-CM | POA: Diagnosis not present

## 2019-08-08 DIAGNOSIS — M2550 Pain in unspecified joint: Secondary | ICD-10-CM | POA: Diagnosis not present

## 2019-08-08 LAB — CBC WITH DIFFERENTIAL/PLATELET
Abs Immature Granulocytes: 0.08 10*3/uL — ABNORMAL HIGH (ref 0.00–0.07)
Basophils Absolute: 0.1 10*3/uL (ref 0.0–0.1)
Basophils Relative: 1 %
Eosinophils Absolute: 0 10*3/uL (ref 0.0–0.5)
Eosinophils Relative: 0 %
HCT: 40.5 % (ref 36.0–46.0)
Hemoglobin: 12.4 g/dL (ref 12.0–15.0)
Immature Granulocytes: 1 %
Lymphocytes Relative: 11 %
Lymphs Abs: 1.5 10*3/uL (ref 0.7–4.0)
MCH: 31.6 pg (ref 26.0–34.0)
MCHC: 30.6 g/dL (ref 30.0–36.0)
MCV: 103.1 fL — ABNORMAL HIGH (ref 80.0–100.0)
Monocytes Absolute: 0.7 10*3/uL (ref 0.1–1.0)
Monocytes Relative: 5 %
Neutro Abs: 11.4 10*3/uL — ABNORMAL HIGH (ref 1.7–7.7)
Neutrophils Relative %: 82 %
Platelets: 237 10*3/uL (ref 150–400)
RBC: 3.93 MIL/uL (ref 3.87–5.11)
RDW: 14.7 % (ref 11.5–15.5)
WBC: 13.8 10*3/uL — ABNORMAL HIGH (ref 4.0–10.5)
nRBC: 0 % (ref 0.0–0.2)

## 2019-08-08 LAB — COMPREHENSIVE METABOLIC PANEL
ALT: 14 U/L (ref 0–44)
AST: 25 U/L (ref 15–41)
Albumin: 2.7 g/dL — ABNORMAL LOW (ref 3.5–5.0)
Alkaline Phosphatase: 35 U/L — ABNORMAL LOW (ref 38–126)
Anion gap: 13 (ref 5–15)
BUN: 31 mg/dL — ABNORMAL HIGH (ref 8–23)
CO2: 22 mmol/L (ref 22–32)
Calcium: 8.9 mg/dL (ref 8.9–10.3)
Chloride: 110 mmol/L (ref 98–111)
Creatinine, Ser: 1.47 mg/dL — ABNORMAL HIGH (ref 0.44–1.00)
GFR calc Af Amer: 37 mL/min — ABNORMAL LOW (ref >60)
GFR calc non Af Amer: 32 mL/min — ABNORMAL LOW (ref >60)
Glucose, Bld: 148 mg/dL — ABNORMAL HIGH (ref 70–99)
Potassium: 3.7 mmol/L (ref 3.5–5.1)
Sodium: 145 mmol/L (ref 135–145)
Total Bilirubin: 0.9 mg/dL (ref 0.3–1.2)
Total Protein: 6.4 g/dL — ABNORMAL LOW (ref 6.5–8.1)

## 2019-08-08 LAB — BRAIN NATRIURETIC PEPTIDE: B Natriuretic Peptide: 53.3 pg/mL (ref 0.0–100.0)

## 2019-08-08 LAB — MAGNESIUM: Magnesium: 1.9 mg/dL (ref 1.7–2.4)

## 2019-08-08 LAB — TROPONIN I (HIGH SENSITIVITY)
Troponin I (High Sensitivity): 182 ng/L (ref <18)
Troponin I (High Sensitivity): 654 ng/L (ref <18)

## 2019-08-08 LAB — LACTIC ACID, PLASMA: Lactic Acid, Venous: 1.4 mmol/L (ref 0.5–1.9)

## 2019-08-08 LAB — SARS CORONAVIRUS 2 BY RT PCR (DIASORIN): SARS Coronavirus 2: NEGATIVE

## 2019-08-08 MED ORDER — ACETAMINOPHEN 325 MG PO TABS
650.0000 mg | ORAL_TABLET | Freq: Four times a day (QID) | ORAL | Status: DC | PRN
  Administered 2019-08-12: 650 mg via ORAL
  Filled 2019-08-08: qty 2

## 2019-08-08 MED ORDER — ACETAMINOPHEN 650 MG RE SUPP: 650.0000 mg | Freq: Four times a day (QID) | RECTAL | Status: DC | PRN

## 2019-08-08 MED ORDER — ENSURE ENLIVE PO LIQD
237.0000 mL | Freq: Two times a day (BID) | ORAL | Status: DC
  Administered 2019-08-09 – 2019-08-14 (×8): 237 mL via ORAL
  Filled 2019-08-08: qty 237

## 2019-08-08 MED ORDER — ALBUMIN HUMAN 25 % IV SOLN
12.5000 g | Freq: Four times a day (QID) | INTRAVENOUS | Status: AC
  Administered 2019-08-08 – 2019-08-10 (×8): 12.5 g via INTRAVENOUS
  Filled 2019-08-08 (×10): qty 50

## 2019-08-08 MED ORDER — SODIUM CHLORIDE 0.9% FLUSH
3.0000 mL | Freq: Two times a day (BID) | INTRAVENOUS | Status: DC
  Administered 2019-08-09 – 2019-08-17 (×15): 3 mL via INTRAVENOUS

## 2019-08-08 MED ORDER — FLUTICASONE PROPIONATE 50 MCG/ACT NA SUSP
2.0000 | Freq: Every day | NASAL | Status: DC | PRN
  Filled 2019-08-08: qty 16

## 2019-08-08 MED ORDER — MEMANTINE HCL 10 MG PO TABS
10.0000 mg | ORAL_TABLET | Freq: Two times a day (BID) | ORAL | Status: DC
  Administered 2019-08-09 – 2019-08-17 (×17): 10 mg via ORAL
  Filled 2019-08-08 (×18): qty 1

## 2019-08-08 MED ORDER — HEPARIN SODIUM (PORCINE) 5000 UNIT/ML IJ SOLN
5000.0000 [IU] | Freq: Two times a day (BID) | INTRAMUSCULAR | Status: DC
  Administered 2019-08-08 – 2019-08-09 (×2): 5000 [IU] via SUBCUTANEOUS
  Filled 2019-08-08 (×2): qty 1

## 2019-08-08 MED ORDER — SODIUM CHLORIDE 0.9 % IV SOLN: INTRAVENOUS | Status: DC

## 2019-08-08 MED ORDER — SODIUM CHLORIDE 0.9 % IV BOLUS
1000.0000 mL | Freq: Once | INTRAVENOUS | Status: AC
  Administered 2019-08-08: 1000 mL via INTRAVENOUS

## 2019-08-08 MED ORDER — LEVOTHYROXINE SODIUM 75 MCG PO TABS
75.0000 ug | ORAL_TABLET | Freq: Every day | ORAL | Status: DC
  Administered 2019-08-09 – 2019-08-17 (×8): 75 ug via ORAL
  Filled 2019-08-08 (×9): qty 1

## 2019-08-08 NOTE — ED Triage Notes (Signed)
Pt bib ems from home with reports of several near syncopal events followed by +LOC with no radial pulses present. Found to be in afib rate 100-120 with unknown history. Pt given 250cc NS bolus with BP 023 systolic. Arrives on 2L [place] with saturations of 97%. Pt in NAD.

## 2019-08-08 NOTE — Consult Note (Signed)
Cardiology Consultation:   Patient ID: Kristina [name] MRN: [id number]; DOB: 08/20/1931  Admit date: 08/08/2019 Date of Consult: 08/08/2019  Primary Care Provider: Midge Minium, MD [facility] Cardiologist: No primary care provider on file. New [facility] Electrophysiologist:  None    Patient Profile:   Kristina [name] is a 86 y.o. female with a hx of hypertension, hypercholesterolemia, hypothyroidism on hormone replacement therapy, CKD stage III, advanced dementia who is being seen today for the evaluation of recurrent syncope at the request of Dr. Roosevelt Locks.  History of Present Illness:   Kristina [name] is very pleasant and cooperative, but has very poor short-term memory.  The review of systems and history is obtained exclusively from her daughter who was present during today's events.    Kristina [name] had a few successive episodes of syncope today.  She complained of feeling lightheaded earlier.  Her daughter was helping her go to the bathroom with her walker when she noticed that she was less steady than usual and started to slump, she lowered her gradually to the floor.  At most the patient lost consciousness very briefly.  She helped her to sit up and eventually stand and then exactly the same phenomenon happened again.  She helped up again and this time assisted her to sit in a chair.  While sitting in the chair the patient lost consciousness completely and was utterly unresponsive.  She called 911.  They were able to revive her and later on the ground.  Reportedly they saw atrial fibrillation, but no documentation of this arrhythmia is available at this time.  The patient recovered consciousness completely and rapidly without any residual neurological sequelae.  The patient has never complained of chest discomfort.  No angina today either.  She denies shortness of breath or palpitations.  Her blood pressure has improved after receiving 5 L of intravenous crystalloid and  eventually 1 unit of albumin.  She remains borderline hypotensive with a systolic around 532-992.  She is mildly tachycardic in the 90s.  Her medications have recently been streamlined to include only levothyroxine and memantine.  The records report that she was prescribed midodrine at her previous hospitalization in June for similar syncope with orthostatic hypotension.  The patient's daughter says she was never notified of this medication and has not picked that up from the pharmacy or administered it to Kristina [name].  Review of systems is significant for advanced memory problems and cognitive difficulties.  The patient drinks very small amounts of fluids unless she is constantly reminded to do so.  She has a history of peripheral neuropathy and remote problems with benign positional vertigo.  Several ECGs performed after arrival to the emergency room all show sinus rhythm or mild sinus tachycardia without ischemic changes.  The initial high-sensitivity troponin was mildly elevated at 182, increasing to 654 about 3 hours later.  During her hospitalization last month the patient had an echocardiogram with essentially normal findings.  There was mild LVH and " grade 1 diastolic dysfunction".  Both atria were normal in size and there were no significant valvular abnormalities.   Past Medical History:  Diagnosis Date  . Benign paroxysmal positional vertigo   . Diverticulosis   . Duodenal ulcer   . Endometrial cancer [facility])    age 18  . GERD (gastroesophageal reflux disease)   . Hypertension   . Hypothyroidism   . Memory loss   . Peripheral neuropathy   . Tubulovillous adenoma polyp of colon 04/1998  Past Surgical History:  Procedure Laterality Date  . CATARACT EXTRACTION    . CHOLECYSTECTOMY    . FOOT SURGERY     Right  . KNEE SURGERY     Right  . VAGINAL HYSTERECTOMY       Home Medications:  Prior to Admission medications   Medication Sig Start Date End Date Taking? Authorizing  Provider  diphenhydrAMINE (BENADRYL) 25 MG tablet Take 2 tablets (50 mg total) by mouth every 6 (six) hours as needed for itching (or hives). 06/14/16  Yes Forde Dandy, MD  EPINEPHrine 0.3 mg/0.3 mL IJ SOAJ injection Inject 0.3 mLs (0.3 mg total) into the muscle once as needed for up to 1 dose (severe allergic reaction). 01/26/19  Yes Midge Minium, MD  feeding supplement, ENSURE ENLIVE, (ENSURE ENLIVE) LIQD Take 237 mLs by mouth 2 (two) times daily between meals. 07/12/19  Yes Hosie Poisson, MD  fluticasone (FLONASE) 50 MCG/ACT nasal spray Place 2 sprays into both nostrils daily as needed (for seasonal allergies). SPRAY 2 SPRAYS INTO EACH NOSTRIL EVERY DAY 07/17/18  Yes Geradine Girt, DO  levothyroxine (SYNTHROID) 75 MCG tablet TAKE 1 TABLET BY MOUTH EVERY DAY Patient taking differently: Take 75 mcg by mouth daily before breakfast.  03/07/19  Yes Midge Minium, MD  loperamide (IMODIUM) 2 MG capsule Take 2 mg by mouth as needed for diarrhea or loose stools.    Yes [provider]  memantine (NAMENDA) 10 MG tablet TAKE 1 TABLET BY MOUTH TWICE A DAY. Please call [phone] to scheduled a yearly appt or may also request refills from PCP. Patient taking differently: Take 10 mg by mouth 2 (two) times daily. TAKE 1 TABLET BY MOUTH TWICE A DAY. Please call [phone] to scheduled a yearly appt or may also request refills from PCP. 08/03/19  Yes Marcial Pacas, MD  [employer] D3 50 MCG (2000 UT) CAPS TAKE 1 CAPSULE DAILY Patient not taking: Reported on 08/08/2019 01/02/18   Midge Minium, MD  midodrine (PROAMATINE) 2.5 MG tablet Take 1 tablet (2.5 mg total) by mouth 2 (two) times daily with a meal. 07/12/19   Hosie Poisson, MD  Multiple Vitamin (MULTIVITAMIN WITH MINERALS) TABS tablet Take 1 tablet by mouth daily. Patient not taking: Reported on 08/08/2019 07/12/19   Hosie Poisson, MD    Inpatient Medications: Scheduled Meds: . [START ON 08/09/2019] feeding supplement (ENSURE ENLIVE)  237 mL Oral BID BM    . heparin  5,000 Units Subcutaneous Q12H  . [START ON 08/09/2019] levothyroxine  75 mcg Oral QAC breakfast  . memantine  10 mg Oral BID  . sodium chloride flush  3 mL Intravenous Q12H   Continuous Infusions: . sodium chloride    . albumin human     PRN Meds: acetaminophen **OR** acetaminophen, fluticasone  Allergies:    Allergies  Allergen Reactions  . Bee Venom Shortness Of Breath, Itching, Rash and Other (See Comments)    Almost passed out, also  . Penicillins Rash    Has patient had a PCN reaction causing immediate rash, facial/tongue/throat swelling, SOB or lightheadedness with hypotension: Yes Has patient had a PCN reaction causing severe rash involving mucus membranes or skin necrosis: No Has patient had a PCN reaction that required hospitalization: Unknown Has patient had a PCN reaction occurring within the last 10 years: No If all of the above answers are "NO", then may proceed with Cephalosporin use.     Social History:   Social History   Socioeconomic History  .  Marital status: Married    Spouse name: Not on file  . Number of children: 3  . Years of education: 13  . Highest education level: Not on file  Occupational History  . Occupation: Retired  Tobacco Use  . Smoking status: Never Smoker  . Smokeless tobacco: Never Used  Vaping Use  . Vaping Use: Never used  Substance and Sexual Activity  . Alcohol use: No    Alcohol/week: 0.0 standard drinks  . Drug use: No  . Sexual activity: Not on file  Other Topics Concern  . Not on file  Social History Narrative   Lives with her daughter, [name].   Widowed.   Retired.   Right hand.   Three children.   High school education.   2 cups coffee daily.   Social Determinants of Health   Financial Resource Strain:   . Difficulty of Paying Living Expenses:   Food Insecurity: No Food Insecurity  . Worried About Charity fundraiser in the Last Year: Never true  . Ran Out of Food in the Last Year: Never true   Transportation Needs:   . Lack of Transportation (Medical):   Marland Kitchen Lack of Transportation (Non-Medical):   Physical Activity:   . Days of Exercise per Week:   . Minutes of Exercise per Session:   Stress:   . Feeling of Stress :   Social Connections:   . Frequency of Communication with Friends and Family:   . Frequency of Social Gatherings with Friends and Family:   . Attends Religious Services:   . Active Member of Clubs or Organizations:   . Attends Archivist Meetings:   Marland Kitchen Marital Status:   Intimate Partner Violence:   . Fear of Current or Ex-Partner:   . Emotionally Abused:   Marland Kitchen Physically Abused:   . Sexually Abused:     Family History:    Family History  Problem Relation Age of Onset  . Dementia Mother   . Heart attack Father   . Heart disease Sister   . Breast cancer Sister 101  . Cancer Brother   . Colon cancer Neg Hx      ROS:  Please see the history of present illness.   All other ROS reviewed and negative.     Physical Exam/Data:   Vitals:   08/08/19 1700 08/08/19 1745 08/08/19 1800 08/08/19 1839  BP: 113/65 (!) 110/61 (!) 112/63 101/68  Pulse: 96 96 95 95  Resp: 19 18 17 20   Temp:      TempSrc:      SpO2: 99% 99% 99% 99%   No intake or output data in the 24 hours ending 08/08/19 1907 Last 3 Weights 07/12/2019 07/11/2019 07/10/2019  Weight (lbs) 157 lb 10.1 oz 155 lb 3.3 oz 152 lb 5.4 oz  Weight (kg) 71.5 kg 70.4 kg 69.1 kg     There is no height or weight on file to calculate BMI.  General:  Well nourished, well developed, in no acute distress, obese HEENT: normal with no evidence of trauma, parched mucous membranes Lymph: no adenopathy Neck: no JVD Endocrine:  No thryomegaly Vascular: No carotid bruits; FA pulses 2+ bilaterally without bruits  Cardiac:  normal S1, S2; RRR; no murmur  Lungs:  clear to auscultation bilaterally, no wheezing, rhonchi or rales  Abd: soft, nontender, no hepatomegaly  Ext: no edema Musculoskeletal:  No  deformities, BUE and BLE strength normal and equal Skin: warm and dry  Neuro:  CNs 2-12  intact, no focal abnormalities noted Psych:  Normal affect   EKG:  The EKG was personally reviewed and demonstrates: Sinus tachycardia with incomplete right bundle branch block, unchanged from tracings from a month earlier Telemetry:  Telemetry was personally reviewed and demonstrates: Sinus rhythm  Relevant CV Studies: Echocardiogram 07/10/2019  1. Left ventricular ejection fraction, by estimation, is 60 to 65%. The  left ventricle has normal function. The left ventricle has no regional  wall motion abnormalities. There is mild concentric left ventricular  hypertrophy. Left ventricular diastolic  parameters are consistent with Grade I diastolic dysfunction (impaired  relaxation).  2. Right ventricular systolic function is normal. The right ventricular  size is normal.  3. The mitral valve is normal in structure. No evidence of mitral valve  regurgitation. No evidence of mitral stenosis.  4. The aortic valve is normal in structure. Aortic valve regurgitation is  not visualized. No aortic stenosis is present.  5. The inferior vena cava is normal in size with greater than 50%  respiratory variability, suggesting right atrial pressure of 3 mmHg.   Laboratory Data:  High Sensitivity Troponin:   Recent Labs  Lab 08/08/19 1341 08/08/19 1622  TROPONINIHS 182* 654*     Chemistry Recent Labs  Lab 08/08/19 1341  NA 145  K 3.7  CL 110  CO2 22  GLUCOSE 148*  BUN 31*  CREATININE 1.47*  CALCIUM 8.9  GFRNONAA 32*  GFRAA 37*  ANIONGAP 13    Recent Labs  Lab 08/08/19 1341  PROT 6.4*  ALBUMIN 2.7*  AST 25  ALT 14  ALKPHOS 35*  BILITOT 0.9   Hematology Recent Labs  Lab 08/08/19 1341  WBC 13.8*  RBC 3.93  HGB 12.4  HCT 40.5  MCV 103.1*  MCH 31.6  MCHC 30.6  RDW 14.7  PLT 237   BNP Recent Labs  Lab 08/08/19 1728  BNP 53.3    DDimer No results for input(s): DDIMER in  the last 168 hours.   Radiology/Studies:  DG Chest Port 1 View  Result Date: 08/08/2019 CLINICAL DATA:  Syncope. EXAM: PORTABLE CHEST 1 VIEW COMPARISON:  July 15, 2018. FINDINGS: The heart size and mediastinal contours are within normal limits. Both lungs are clear. No pneumothorax or pleural effusion is noted. The visualized skeletal structures are unremarkable. IMPRESSION: No active disease. Electronically Signed   By: Marijo Conception M.D.   On: 08/08/2019 13:55     Assessment and Plan:   1. Syncope: The history is highly consistent with orthostatic hypotension related syncope.  Repeated attempts to raise the patient to the vertical position led to loss of consciousness, with recovery when she was supine.  There were no other associated cardiovascular complaints.  She has never experienced syncope while she is in a supine position.  She has very poor intake of fluids.  She had clearly documented orthostatic hypotension on previous hospitalization as well.  Recommend aggressive constant rehydration during the day and a liberal intake of salt.  This patient no longer has essential hypertension.  Would allow her systolic blood pressure to drift to the 140s-160 range without pharmaceutical intervention.  Also recommend compression stockings and even an abdominal binder.  If these conservative interventions do not provide relief of symptoms, midodrine can be considered, but it is important to impress upon the patient that she cannot lie down for 4 hours after taking that medication. 2. Elevated troponin: I suspect that the patient had a prolonged episode of severe hypotension while sitting in  the chair waiting for the arrival of EMS which could easily explain the elevated cardiac enzymes.  Would recheck just 1 more troponin level in the morning, I assume that this will show a downward trend.  There are no other features to suggest that she has ischemic heart disease and aggressive evaluation for coronary  disease in this elderly patient with advanced dementia would only be disadvantageous.  She is not a candidate for revascularization strategies.      For questions or updates, please contact [place] Please consult [email or web address] for contact info under    Signed, Sanda Klein, MD  08/08/2019 7:07 PM

## 2019-08-08 NOTE — ED Notes (Signed)
Mittens applied d/t pt pulling at tele equipment

## 2019-08-08 NOTE — H&P (Signed)
History and Physical    Kristina [name] YKD:[id number] DOB: 28-Mar-1931 DOA: 08/08/2019  PCP: Midge Minium, MD (Confirm with patient/family/NH records and if not entered, this has to be entered at [facility] point of entry) Patient coming from: Home  I have personally briefly reviewed patient's old medical records in [place]  Chief Complaint: Feeling weak and fell  HPI: Kristina [name] is a 86 y.o. female with medical history significant of advanced dementia, hypertension, hyperlipidemia, hypothyroidism, CKD stage III, presented with near syncope episodes.  Patient is advanced baseline dementia, most of the history provided by patient's daughter at the bedside.  Patient was being treated for similar problems about 4 weeks ago.  During that admission, ultrasound patient has orthostatic hypotension, midodrine was started.  However according to patient daughter, patient never took midodrine.  Daughter reported patient only eats or drinks with close monitoring and reminding.  However the patient daughter has been quite tied up with her job recently, and unable to supervise her as before.  Patient has been eating/drinking as usual to yesterday, and today patient complains about feeling lightheaded and had multiple episodes of wobbly gait, last episode patient collapsed, daughter was able to hold her down and no significant injuries.  EMS arrived found patient to be tachycardia and hypotensive.  ED Course: Patient was found to have hypotension and tachycardia, heart rate and blood pressure improved with 5 L of IV fluid.  UA pending.  CMP showed stable CKD.  Review of Systems: Unable to perform patient baseline dementia   Past Medical History:  Diagnosis Date  . Benign paroxysmal positional vertigo   . Diverticulosis   . Duodenal ulcer   . Endometrial cancer [facility])    age 35  . GERD (gastroesophageal reflux disease)   . Hypertension   . Hypothyroidism   . Memory loss   .  Peripheral neuropathy   . 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 CATARACT EXTRACTION    . CHOLECYSTECTOMY    . FOOT SURGERY     Right  . KNEE SURGERY     Right  . VAGINAL HYSTERECTOMY       reports that she has never smoked. She has never used smokeless tobacco. She reports that she does not drink alcohol and does not use drugs.  Allergies  Allergen Reactions  . Bee Venom Shortness Of Breath, Itching, Rash and Other (See Comments)    Almost passed out, also  . Penicillins Rash    Has patient had a PCN reaction causing immediate rash, facial/tongue/throat swelling, SOB or lightheadedness with hypotension: Yes Has patient had a PCN reaction causing severe rash involving mucus membranes or skin necrosis: No Has patient had a PCN reaction that required hospitalization: Unknown Has patient had a PCN reaction occurring within the last 10 years: No If all of the above answers are "NO", then may proceed with Cephalosporin use.     Family History  Problem Relation Age of Onset  . Dementia Mother   . Heart attack Father   . Heart disease Sister   . Breast cancer Sister 68  . Cancer Brother   . Colon cancer Neg Hx     Prior to Admission medications   Medication Sig Start Date End Date Taking? Authorizing Provider  diphenhydrAMINE (BENADRYL) 25 MG tablet Take 2 tablets (50 mg total) by mouth every 6 (six) hours as needed for itching (or hives). 06/14/16  Yes Forde Dandy, MD  EPINEPHrine 0.3 mg/0.3 mL IJ SOAJ injection Inject 0.3 mLs (0.3 mg total) into the muscle once as needed for up to 1 dose (severe allergic reaction). 01/26/19  Yes Midge Minium, MD  feeding supplement, ENSURE ENLIVE, (ENSURE ENLIVE) LIQD Take 237 mLs by mouth 2 (two) times daily between meals. 07/12/19  Yes Hosie Poisson, MD  fluticasone (FLONASE) 50 MCG/ACT nasal spray Place 2 sprays into both nostrils daily as needed (for seasonal allergies).  SPRAY 2 SPRAYS INTO EACH NOSTRIL EVERY DAY 07/17/18  Yes Geradine Girt, DO  levothyroxine (SYNTHROID) 75 MCG tablet TAKE 1 TABLET BY MOUTH EVERY DAY Patient taking differently: Take 75 mcg by mouth daily before breakfast.  03/07/19  Yes Midge Minium, MD  loperamide (IMODIUM) 2 MG capsule Take 2 mg by mouth as needed for diarrhea or loose stools.    Yes [provider]  memantine (NAMENDA) 10 MG tablet TAKE 1 TABLET BY MOUTH TWICE A DAY. Please call [phone] to scheduled a yearly appt or may also request refills from PCP. Patient taking differently: Take 10 mg by mouth 2 (two) times daily. TAKE 1 TABLET BY MOUTH TWICE A DAY. Please call [phone] to scheduled a yearly appt or may also request refills from PCP. 08/03/19  Yes Marcial Pacas, MD  [employer] D3 50 MCG (2000 UT) CAPS TAKE 1 CAPSULE DAILY Patient not taking: Reported on 08/08/2019 01/02/18   Midge Minium, MD  midodrine (PROAMATINE) 2.5 MG tablet Take 1 tablet (2.5 mg total) by mouth 2 (two) times daily with a meal. 07/12/19   Hosie Poisson, MD  Multiple Vitamin (MULTIVITAMIN WITH MINERALS) TABS tablet Take 1 tablet by mouth daily. Patient not taking: Reported on 08/08/2019 07/12/19   Hosie Poisson, MD    Physical Exam: Vitals:   08/08/19 1652 08/08/19 1700 08/08/19 1745 08/08/19 1800  BP: 103/69 113/65 (!) 110/61 (!) 112/63  Pulse: 95 96 96 95  Resp: 18 19 18 17   Temp:      TempSrc:      SpO2: 99% 99% 99% 99%    Constitutional: NAD, calm, comfortable Vitals:   08/08/19 1652 08/08/19 1700 08/08/19 1745 08/08/19 1800  BP: 103/69 113/65 (!) 110/61 (!) 112/63  Pulse: 95 96 96 95  Resp: 18 19 18 17   Temp:      TempSrc:      SpO2: 99% 99% 99% 99%   Eyes: PERRL, lids and conjunctivae normal ENMT: Mucous membranes are dry. Posterior pharynx clear of any exudate or lesions.Normal dentition.  Neck: normal, supple, no masses, no thyromegaly Respiratory: clear to auscultation bilaterally, no wheezing, no crackles. Normal  respiratory effort. No accessory muscle use.  Cardiovascular: Regular rate and rhythm, no murmurs / rubs / gallops. 2+ extremity edema on RLE. 2+ pedal pulses. No carotid bruits.  Abdomen: no tenderness, no masses palpated. No hepatosplenomegaly. Bowel sounds positive.  Musculoskeletal: no clubbing / cyanosis. No joint deformity upper and lower extremities. Good ROM, no contractures. Normal muscle tone.  Skin: no rashes, lesions, ulcers. No induration Neurologic: Moving all limbs Psychiatric: Confused    Labs on Admission: I have personally reviewed following labs and imaging studies  CBC: Recent Labs  Lab 08/08/19 1341  WBC 13.8*  NEUTROABS 11.4*  HGB 12.4  HCT 40.5  MCV 103.1*  PLT 384   Basic Metabolic Panel: Recent Labs  Lab 08/08/19 1341  NA 145  K 3.7  CL 110  CO2 22  GLUCOSE 148*  BUN  31*  CREATININE 1.47*  CALCIUM 8.9  MG 1.9   GFR: CrCl cannot be calculated (Unknown ideal weight.). Liver Function Tests: Recent Labs  Lab 08/08/19 1341  AST 25  ALT 14  ALKPHOS 35*  BILITOT 0.9  PROT 6.4*  ALBUMIN 2.7*   No results for input(s): LIPASE, AMYLASE in the last 168 hours. No results for input(s): AMMONIA in the last 168 hours. Coagulation Profile: No results for input(s): INR, PROTIME in the last 168 hours. Cardiac Enzymes: No results for input(s): CKTOTAL, CKMB, CKMBINDEX, TROPONINI in the last 168 hours. BNP (last 3 results) No results for input(s): PROBNP in the last 8760 hours. HbA1C: No results for input(s): HGBA1C in the last 72 hours. CBG: No results for input(s): GLUCAP in the last 168 hours. Lipid Profile: No results for input(s): CHOL, HDL, LDLCALC, TRIG, CHOLHDL, LDLDIRECT in the last 72 hours. Thyroid Function Tests: No results for input(s): TSH, T4TOTAL, FREET4, T3FREE, THYROIDAB in the last 72 hours. Anemia Panel: No results for input(s): VITAMINB12, FOLATE, FERRITIN, TIBC, IRON, RETICCTPCT in the last 72 hours. Urine analysis:      Component Value Date/Time   COLORURINE YELLOW 07/09/2019 1444   APPEARANCEUR HAZY (A) 07/09/2019 1444   LABSPEC 1.025 07/09/2019 1444   PHURINE 5.0 07/09/2019 1444   GLUCOSEU NEGATIVE 07/09/2019 1444   HGBUR NEGATIVE 07/09/2019 1444   BILIRUBINUR NEGATIVE 07/09/2019 1444   KETONESUR NEGATIVE 07/09/2019 1444   PROTEINUR NEGATIVE 07/09/2019 1444   NITRITE NEGATIVE 07/09/2019 1444   LEUKOCYTESUR NEGATIVE 07/09/2019 1444    Radiological Exams on Admission: DG Chest Port 1 View  Result Date: 08/08/2019 CLINICAL DATA:  Syncope. EXAM: PORTABLE CHEST 1 VIEW COMPARISON:  July 15, 2018. FINDINGS: The heart size and mediastinal contours are within normal limits. Both lungs are clear. No pneumothorax or pleural effusion is noted. The visualized skeletal structures are unremarkable. IMPRESSION: No active disease. Electronically Signed   By: Marijo Conception M.D.   On: 08/08/2019 13:55    EKG: Independently reviewed.  Chronic RBBB, no acute ST changes  Assessment/Plan Active Problems:   Syncope  (please populate well all problems here in Problem List. (For example, if patient is on BP meds at home and you resume or decide to hold them, it is a problem that needs to be her. Same for CAD, COPD, HLD and so on)  Syncope/near syncope -Clinically she is hypovolemic, likely from poor oral intake. -No signs of active infection, UA pending, will check lactic acid. -Hydration for tonight, plus IV albumin -PT evaluation tomorrow -May need home care with more intensive scheduling, consult transition care team -Her BP already improved with IV hydration in the ED, will continue IV hydration.  Will monitor off midodrine for now given her kidney function.  Positive troponins -Suspect this represents a demand ischemia from tachycardia -GI informed, will see the pt this evening -Check CK, given that she may have multiple episodes of fall (unwitnessed) -Repeat trop in AM  Leukocytosis -Chest x-ray no clear  infiltrates, UA pending, monitor off antibiotics for now -Check lactic acid  Hypertension -As above suspect hypovolemia, correct volume and reevaluate.  RLE swelling -Check DVT study  Hypoalbuminemia -Albumin infusion, probably has a chronic proteinuria.  CKD stage III -As above  Deconditioning -Hydration and PT evaluation  DVT prophylaxis: Heparin subcu Code Status: DNR Family Communication: Daughter at bedside Disposition Plan: Expect 1 to 2 days hospital stay for hydration and PT evaluation Consults called:None Admission status: Tele obs   Hrishikesh Hoeg T  Eathon Valade MD [facility] Pager [phone]  08/08/2019, 6:08 PM

## 2019-08-08 NOTE — ED Notes (Signed)
Report called to sunny RN

## 2019-08-08 NOTE — ED Notes (Signed)
Date and time results received: 08/08/19  Test: Troponin Critical Value: 654 ng/l  Name of Provider Notified: Marijean Bravo PA

## 2019-08-08 NOTE — ED Provider Notes (Signed)
[facility] EMERGENCY DEPARTMENT Provider Note   CSN: [id number] Arrival date & time: 08/08/19  1313     History Chief Complaint  Patient presents with  . Loss of Consciousness    Kristina [name] is a 86 y.o. female.  Kristina [name] is a 86 y.o. female with a history of hypertension, GERD, hypothyroidism, endometrial cancer s/p treatment, near syncope, GERD, who presents to the ED via EMS for evaluation of multiple near syncopal episodes, patient did have 1 complete syncopal episode witnessed by her daughter.  Woke up feeling lightheaded, had multiple episodes where she felt like she needed to sit down where she would pass out, and then she had one episode where the daughter had to catch her and lowered to the ground.  She had complete loss of consciousness and briefly could not palpate a radial pulse.  When EMS arrived patient was found to be tachycardic in the 120s with concern for potential A. fib without known history.  Patient was hypotensive on initial arrival and given 250 cc of normal saline with EMS.  History is extremely limited from the patient, she states she was feeling a bit short of breath but denies any chest pain, denies any abdominal pain, vomiting, diarrhea or fevers.  Patient states she does not remember really if she has been eating and drinking normally.  She does not think that she fell or hit her head and denies any focal pain anywhere.  Patient's daughter later arrived at bedside, who the patient lives with and states that she works from home, and often struggles to make sure that her mom is drinking water frequently, and because of her dementia and requires constant reminding.  She had a similar episode although less severe at the end of June and was admitted to the hospital for dehydration and subsequently started on midodrine, she was placed in a SNF for 2 weeks and discharged home, they have been trying to get home health care set up but there was  an issue with it starting this week.  Level 5 caveat: Dementia        Past Medical History:  Diagnosis Date  . Benign paroxysmal positional vertigo   . Diverticulosis   . Duodenal ulcer   . Endometrial cancer [facility])    age 19  . GERD (gastroesophageal reflux disease)   . Hypertension   . Hypothyroidism   . Memory loss   . Peripheral neuropathy   . Tubulovillous adenoma polyp of colon 04/1998    Patient Active Problem List   Diagnosis Date Noted  . Pressure injury of skin 07/10/2019  . Orthostatic hypotension 07/09/2019  . Macrocytic anemia 07/09/2019  . Thrombocytopenia ([place]) 07/09/2019  . Pressure injury of buttock, stage 3 ([place]) 07/05/2019  . Weakness 07/15/2018  . Acute on chronic kidney failure ([place]) 07/15/2018  . Hypertriglyceridemia 02/07/2017  . Vitamin D deficiency 08/06/2016  . Physical exam 08/06/2016  . Abnormality of gait 04/23/2016  . Hypothyroidism 02/05/2016  . HTN (hypertension) 02/05/2016  . Dementia ([place]) 09/23/2015  . Paresthesia 01/28/2014  . Hyperreflexia 01/28/2014  . GERD 01/16/2008  . OTHER POSTOPERATIVE FUNCTIONAL DISORDERS 01/16/2008  . TUBULOVILLOUS ADENOMA, COLON, HX OF 01/10/2008    Past Surgical History:  Procedure Laterality Date  . CATARACT EXTRACTION    . CHOLECYSTECTOMY    . FOOT SURGERY     Right  . KNEE SURGERY     Right  . VAGINAL HYSTERECTOMY       OB  History   No obstetric history on file.     Family History  Problem Relation Age of Onset  . Dementia Mother   . Heart attack Father   . Heart disease Sister   . Breast cancer Sister 73  . Cancer Brother   . Colon cancer Neg Hx     Social History   Tobacco Use  . Smoking status: Never Smoker  . Smokeless tobacco: Never Used  Vaping Use  . Vaping Use: Never used  Substance Use Topics  . Alcohol use: No    Alcohol/week: 0.0 standard drinks  . Drug use: No    Home Medications Prior to Admission medications   Medication Sig Start Date End Date Taking?  Authorizing Provider  diphenhydrAMINE (BENADRYL) 25 MG tablet Take 2 tablets (50 mg total) by mouth every 6 (six) hours as needed for itching (or hives). 06/14/16  Yes Forde Dandy, MD  EPINEPHrine 0.3 mg/0.3 mL IJ SOAJ injection Inject 0.3 mLs (0.3 mg total) into the muscle once as needed for up to 1 dose (severe allergic reaction). 01/26/19  Yes Midge Minium, MD  feeding supplement, ENSURE ENLIVE, (ENSURE ENLIVE) LIQD Take 237 mLs by mouth 2 (two) times daily between meals. 07/12/19  Yes Hosie Poisson, MD  fluticasone (FLONASE) 50 MCG/ACT nasal spray Place 2 sprays into both nostrils daily as needed (for seasonal allergies). SPRAY 2 SPRAYS INTO EACH NOSTRIL EVERY DAY 07/17/18  Yes Geradine Girt, DO  levothyroxine (SYNTHROID) 75 MCG tablet TAKE 1 TABLET BY MOUTH EVERY DAY Patient taking differently: Take 75 mcg by mouth daily before breakfast.  03/07/19  Yes Midge Minium, MD  loperamide (IMODIUM) 2 MG capsule Take 2 mg by mouth as needed for diarrhea or loose stools.    Yes [provider]  memantine (NAMENDA) 10 MG tablet TAKE 1 TABLET BY MOUTH TWICE A DAY. Please call [phone] to scheduled a yearly appt or may also request refills from PCP. 08/03/19  Yes Marcial Pacas, MD  CVS D3 50 MCG (2000 UT) CAPS TAKE 1 CAPSULE DAILY Patient not taking: Reported on 08/08/2019 01/02/18   Midge Minium, MD  midodrine (PROAMATINE) 2.5 MG tablet Take 1 tablet (2.5 mg total) by mouth 2 (two) times daily with a meal. 07/12/19   Hosie Poisson, MD  Multiple Vitamin (MULTIVITAMIN WITH MINERALS) TABS tablet Take 1 tablet by mouth daily. Patient not taking: Reported on 08/08/2019 07/12/19   Hosie Poisson, MD    Allergies    Bee venom and Penicillins  Review of Systems   Review of Systems  Unable to perform ROS: Dementia    Physical Exam Updated Vital Signs BP (!) 98/61   Pulse (!) 109   Temp 97.9 F (36.6 C) (Oral)   Resp 23   SpO2 95%   Physical Exam Vitals and nursing note reviewed.    Constitutional:      General: She is not in acute distress.    Appearance: Normal appearance. She is well-developed. She is ill-appearing. She is not diaphoretic.     Comments: Alert, ill-appearing but in no acute distress  HENT:     Head: Normocephalic and atraumatic.     Mouth/Throat:     Comments: Mucous membranes are extremely dry with dry cracked lips and dry tongue Eyes:     General:        Right eye: No discharge.        Left eye: No discharge.     Extraocular Movements: Extraocular  movements intact.     Pupils: Pupils are equal, round, and reactive to light.  Cardiovascular:     Rate and Rhythm: Regular rhythm. Tachycardia present.     Heart sounds: Normal heart sounds. No murmur heard.  No friction rub. No gallop.      Comments: Tachycardia with regular rhythm Pulmonary:     Effort: Pulmonary effort is normal. No respiratory distress.     Breath sounds: Normal breath sounds. No wheezing or rales.     Comments: Respirations equal and unlabored, patient able to speak in full sentences, lungs clear to auscultation bilaterally with some decreased air movement bilaterally Abdominal:     General: Bowel sounds are normal. There is no distension.     Palpations: Abdomen is soft. There is no mass.     Tenderness: There is no abdominal tenderness. There is no guarding.     Comments: Abdomen soft, nondistended, nontender to palpation in all quadrants without guarding or peritoneal signs  Musculoskeletal:        General: No deformity.     Cervical back: Neck supple.     Right lower leg: No edema.     Left lower leg: No edema.     Comments: Bilateral lower extremities without edema, distal pulses intact  Skin:    General: Skin is warm and dry.     Capillary Refill: Capillary refill takes less than 2 seconds.  Neurological:     Mental Status: She is alert.     Coordination: Coordination normal.     Comments: Speech is clear, able to follow commands Moves extremities without  ataxia, coordination intact  Psychiatric:        Mood and Affect: Mood normal.        Behavior: Behavior normal.        Cognition and Memory: Memory is impaired.     ED Results / Procedures / Treatments   Labs (all labs ordered are listed, but only abnormal results are displayed) Labs Reviewed  COMPREHENSIVE METABOLIC PANEL - Abnormal; Notable for the following components:      Result Value   Glucose, Bld 148 (*)    BUN 31 (*)    Creatinine, Ser 1.47 (*)    Total Protein 6.4 (*)    Albumin 2.7 (*)    Alkaline Phosphatase 35 (*)    GFR calc non Af Amer 32 (*)    GFR calc Af Amer 37 (*)    All other components within normal limits  CBC WITH DIFFERENTIAL/PLATELET - Abnormal; Notable for the following components:   WBC 13.8 (*)    MCV 103.1 (*)    Neutro Abs 11.4 (*)    Abs Immature Granulocytes 0.08 (*)    All other components within normal limits  TROPONIN I (HIGH SENSITIVITY) - Abnormal; Notable for the following components:   Troponin I (High Sensitivity) 182 (*)    All other components within normal limits  SARS CORONAVIRUS 2 BY RT PCR (HOSPITAL ORDER, [place] [facility])  MAGNESIUM  URINALYSIS, ROUTINE W REFLEX MICROSCOPIC    EKG EKG Interpretation  Date/Time:  Wednesday August 08 2019 13:25:51 EDT Ventricular Rate:  113 PR Interval:    QRS Duration: 108 QT Interval:  347 QTC Calculation: 476 R Axis:   125 Text Interpretation: Sinus tachycardia Right axis deviation Low voltage, precordial leads RSR' in V1 or V2, probably normal variant Borderline repolarization abnormality Confirmed by Elnora Morrison [phone]) on 08/08/2019 2:36:08 PM   Radiology DG  Chest Port 1 View  Result Date: 08/08/2019 CLINICAL DATA:  Syncope. EXAM: PORTABLE CHEST 1 VIEW COMPARISON:  July 15, 2018. FINDINGS: The heart size and mediastinal contours are within normal limits. Both lungs are clear. No pneumothorax or pleural effusion is noted. The visualized skeletal structures  are unremarkable. IMPRESSION: No active disease. Electronically Signed   By: Marijo Conception M.D.   On: 08/08/2019 13:55    Procedures .Critical Care Performed by: Jacqlyn Larsen, PA-C Authorized by: Jacqlyn Larsen, PA-C   Critical care provider statement:    Critical care time (minutes):  45   Critical care was necessary to treat or prevent imminent or life-threatening deterioration of the following conditions:  Cardiac failure and circulatory failure (Rising troponins, hypotension)   Critical care was time spent personally by me on the following activities:  Discussions with consultants, evaluation of patient's response to treatment, examination of patient, ordering and performing treatments and interventions, ordering and review of laboratory studies, ordering and review of radiographic studies, pulse oximetry, re-evaluation of patient's condition, obtaining history from patient or surrogate and review of old charts   (including critical care time)  Medications Ordered in ED Medications  sodium chloride 0.9 % bolus 1,000 mL (1,000 mLs Intravenous New Bag/Given 08/08/19 1334)    ED Course  I have reviewed the triage vital signs and the nursing notes.  Pertinent labs & imaging results that were available during my care of the patient were reviewed by me and considered in my medical decision making (see chart for details).    MDM Rules/Calculators/A&P                          86 year old female arrives via EMS tachycardic and mildly hypotensive with heart rate of 109 and blood pressure of 98/61.  EMS question whether patient may have some new onset A. fib, but EKG is here appeared to be sinus tach with a very regular rhythm.  On exam patient looks very dry with dry cracked lips and mucous membranes, she has no lower extremity edema and I am concerned that dehydration and hypovolemia may be contributing to blood pressure and tachycardia, she is afebrile has not had any fevers, daughter has  not noted cough, vomiting, diarrhea or urinary symptoms.  Lower suspicion for infection.  Patient also had multiple near syncopal episodes and one complete syncopal episode at home today, did not fall or hit her head.  Will check basic labs, urinalysis, troponin, EKG, chest x-ray.  Will give IV fluids  I have independently ordered, reviewed and interpreted all labs and imaging: CBC: mild leukocytosis of 13.8, patient remains afebrile, normal hemoglobin, suspect leukocytosis may see more so in the setting of hemoconcentration CMP: Creatinine increased from baseline at 1.47 with elevated BUN, glucose of 148, no other significant electrolyte derangements, Troponin: Initial troponin elevated at 182, will get delta troponin, patient's EKG without current ischemic changes and patient currently denies chest pain.  Chest x-ray is clear  UA is still pending, patient has not had urinary output, likely in the setting of significant dehydration.  Patient continuing to receive IV fluids.  Blood pressure improving and remaining stable.  Delta troponin returns at 654, patient continues to deny chest pain, but will consult cardiology given significant increase in troponin  Case discussed with cardiology who will see patient in consult for elevated troponin but request medicine admission.  Case discussed with Dr. Roosevelt Locks with [facility] who will see  and admit the patient.   Final Clinical Impression(s) / ED Diagnoses Final diagnoses:  Syncope, unspecified syncope type  Dehydration  Elevated troponin    Rx / DC Orders ED Discharge Orders    None       Janet Berlin 08/08/19 2136    Elnora Morrison, MD 08/14/19 [phone]

## 2019-08-09 ENCOUNTER — Observation Stay (HOSPITAL_COMMUNITY): Payer: Medicare Other

## 2019-08-09 ENCOUNTER — Encounter (HOSPITAL_COMMUNITY): Payer: Self-pay | Admitting: Internal Medicine

## 2019-08-09 DIAGNOSIS — I82441 Acute embolism and thrombosis of right tibial vein: Secondary | ICD-10-CM | POA: Diagnosis present

## 2019-08-09 DIAGNOSIS — I824Y1 Acute embolism and thrombosis of unspecified deep veins of right proximal lower extremity: Secondary | ICD-10-CM | POA: Diagnosis not present

## 2019-08-09 DIAGNOSIS — N179 Acute kidney failure, unspecified: Secondary | ICD-10-CM | POA: Diagnosis present

## 2019-08-09 DIAGNOSIS — I82411 Acute embolism and thrombosis of right femoral vein: Secondary | ICD-10-CM | POA: Diagnosis present

## 2019-08-09 DIAGNOSIS — R609 Edema, unspecified: Secondary | ICD-10-CM | POA: Diagnosis not present

## 2019-08-09 DIAGNOSIS — Z66 Do not resuscitate: Secondary | ICD-10-CM | POA: Diagnosis present

## 2019-08-09 DIAGNOSIS — K219 Gastro-esophageal reflux disease without esophagitis: Secondary | ICD-10-CM | POA: Diagnosis present

## 2019-08-09 DIAGNOSIS — N1831 Chronic kidney disease, stage 3a: Secondary | ICD-10-CM | POA: Diagnosis present

## 2019-08-09 DIAGNOSIS — Z8249 Family history of ischemic heart disease and other diseases of the circulatory system: Secondary | ICD-10-CM | POA: Diagnosis not present

## 2019-08-09 DIAGNOSIS — E785 Hyperlipidemia, unspecified: Secondary | ICD-10-CM | POA: Diagnosis present

## 2019-08-09 DIAGNOSIS — I82431 Acute embolism and thrombosis of right popliteal vein: Secondary | ICD-10-CM | POA: Diagnosis present

## 2019-08-09 DIAGNOSIS — I824Y2 Acute embolism and thrombosis of unspecified deep veins of left proximal lower extremity: Secondary | ICD-10-CM | POA: Diagnosis not present

## 2019-08-09 DIAGNOSIS — F0390 Unspecified dementia without behavioral disturbance: Secondary | ICD-10-CM | POA: Diagnosis present

## 2019-08-09 DIAGNOSIS — Z79899 Other long term (current) drug therapy: Secondary | ICD-10-CM | POA: Diagnosis not present

## 2019-08-09 DIAGNOSIS — Z91030 Bee allergy status: Secondary | ICD-10-CM | POA: Diagnosis not present

## 2019-08-09 DIAGNOSIS — Z803 Family history of malignant neoplasm of breast: Secondary | ICD-10-CM | POA: Diagnosis not present

## 2019-08-09 DIAGNOSIS — Z8542 Personal history of malignant neoplasm of other parts of uterus: Secondary | ICD-10-CM | POA: Diagnosis not present

## 2019-08-09 DIAGNOSIS — I951 Orthostatic hypotension: Secondary | ICD-10-CM | POA: Diagnosis present

## 2019-08-09 DIAGNOSIS — Z79890 Hormone replacement therapy (postmenopausal): Secondary | ICD-10-CM | POA: Diagnosis not present

## 2019-08-09 DIAGNOSIS — E870 Hyperosmolality and hypernatremia: Secondary | ICD-10-CM | POA: Diagnosis present

## 2019-08-09 DIAGNOSIS — I129 Hypertensive chronic kidney disease with stage 1 through stage 4 chronic kidney disease, or unspecified chronic kidney disease: Secondary | ICD-10-CM | POA: Diagnosis present

## 2019-08-09 DIAGNOSIS — R55 Syncope and collapse: Secondary | ICD-10-CM | POA: Diagnosis not present

## 2019-08-09 DIAGNOSIS — E860 Dehydration: Secondary | ICD-10-CM | POA: Diagnosis present

## 2019-08-09 DIAGNOSIS — I248 Other forms of acute ischemic heart disease: Secondary | ICD-10-CM | POA: Diagnosis present

## 2019-08-09 DIAGNOSIS — Z880 Allergy status to penicillin: Secondary | ICD-10-CM | POA: Diagnosis not present

## 2019-08-09 DIAGNOSIS — Z20822 Contact with and (suspected) exposure to covid-19: Secondary | ICD-10-CM | POA: Diagnosis present

## 2019-08-09 DIAGNOSIS — E039 Hypothyroidism, unspecified: Secondary | ICD-10-CM | POA: Diagnosis present

## 2019-08-09 DIAGNOSIS — E861 Hypovolemia: Secondary | ICD-10-CM | POA: Diagnosis present

## 2019-08-09 LAB — GLUCOSE, CAPILLARY: Glucose-Capillary: 91 mg/dL (ref 70–99)

## 2019-08-09 LAB — BASIC METABOLIC PANEL
Anion gap: 13 (ref 5–15)
BUN: 29 mg/dL — ABNORMAL HIGH (ref 8–23)
CO2: 22 mmol/L (ref 22–32)
Calcium: 8.3 mg/dL — ABNORMAL LOW (ref 8.9–10.3)
Chloride: 111 mmol/L (ref 98–111)
Creatinine, Ser: 1.15 mg/dL — ABNORMAL HIGH (ref 0.44–1.00)
GFR calc Af Amer: 50 mL/min — ABNORMAL LOW (ref >60)
GFR calc non Af Amer: 43 mL/min — ABNORMAL LOW (ref >60)
Glucose, Bld: 99 mg/dL (ref 70–99)
Potassium: 3.7 mmol/L (ref 3.5–5.1)
Sodium: 146 mmol/L — ABNORMAL HIGH (ref 135–145)

## 2019-08-09 LAB — CK: Total CK: 44 U/L (ref 38–234)

## 2019-08-09 LAB — TROPONIN I (HIGH SENSITIVITY): Troponin I (High Sensitivity): 455 ng/L (ref <18)

## 2019-08-09 MED ORDER — SODIUM CHLORIDE 0.9 % IV SOLN: INTRAVENOUS | Status: DC

## 2019-08-09 MED ORDER — ENOXAPARIN SODIUM 80 MG/0.8ML ~~LOC~~ SOLN
65.0000 mg | SUBCUTANEOUS | Status: DC
  Administered 2019-08-09 – 2019-08-15 (×6): 65 mg via SUBCUTANEOUS
  Filled 2019-08-09 (×6): qty 0.8

## 2019-08-09 MED ORDER — ADULT MULTIVITAMIN W/MINERALS CH
1.0000 | ORAL_TABLET | Freq: Every day | ORAL | Status: DC
  Administered 2019-08-09 – 2019-08-17 (×8): 1 via ORAL
  Filled 2019-08-09 (×8): qty 1

## 2019-08-09 NOTE — Evaluation (Signed)
Physical Therapy Evaluation Patient Details Name: Kristina [name] MRN: [id number] DOB: 01-29-1931 Today's Date: 08/09/2019   History of Present Illness  86 y.o. female with medical history significant of advanced dementia, hypertension, BPPV, GERD, hyperlipidemia, hypothyroidism, CKD stage III, presents to ED on 7/28 with near syncope episodes. Pt additionally with elevated troponins, now downtrending and cardiology suspects secondary to hypotension.  Clinical Impression   Pt presents with generalized weakness, impaired standing balance, poor safety awareness requiring multimodal PT cuing for safety, and decreased activity tolerance. Pt additionally with + orthostatic hypotension (see below), although pt reports no dizziness. Pt to benefit from acute PT to address deficits. Pt ambulated hallway distance with RW and assist from PT to steady and guide RW. Per pt, her daughter works so she will not have 24/7 assist at home. Therefore, PT recommending SNF level of care post-acutely to address mobility deficits and safety issues. PT to progress mobility as tolerated, and will continue to follow acutely.    08/09/19 0900  Orthostatic Lying   BP- Lying 139/88  Pulse- Lying 79  Orthostatic Sitting  BP- Sitting 123/70  Pulse- Sitting 79  Orthostatic Standing at 0 minutes  BP- Standing at 0 minutes 106/81  Pulse- Standing at 0 minutes 93  Orthostatic Standing at 3 minutes  BP- Standing at 3 minutes 96/63  Pulse- Standing at 3 minutes 115      Follow Up Recommendations SNF;Supervision/Assistance - 24 hour    Equipment Recommendations  None recommended by PT    Recommendations for Other Services       Precautions / Restrictions Precautions Precautions: Fall Restrictions Weight Bearing Restrictions: No      Mobility  Bed Mobility Overal bed mobility: Needs Assistance Bed Mobility: Supine to Sit     Supine to sit: Min assist;HOB elevated     General bed mobility comments: min  assist for trunk elevation with HHA, scooting to EOB with use of bed pads.  Transfers Overall transfer level: Needs assistance Equipment used: Rolling walker (2 wheeled) Transfers: Sit to/from Stand Sit to Stand: Min assist         General transfer comment: min assist to steady, lines/leads, verbal and tactile cuing for hand placement when rising/sitting. x2, from EOB and from toilet.  Ambulation/Gait Ambulation/Gait assistance: Min assist Gait Distance (Feet): 75 Feet Assistive device: Rolling walker (2 wheeled) Gait Pattern/deviations: Step-through pattern;Decreased stride length;Trunk flexed Gait velocity: decr   General Gait Details: min assist to steady and guide pt/RW during hallway and room navigation. Verbal cuing for placement in RW, upright posture.  Stairs            Wheelchair Mobility    Modified Rankin (Stroke Patients Only)       Balance Overall balance assessment: Needs assistance Sitting-balance support: No upper extremity supported;Feet supported Sitting balance-Leahy Scale: Good     Standing balance support: Bilateral upper extremity supported;During functional activity Standing balance-Leahy Scale: Poor Standing balance comment: reaches for environment to steady self when no UE support provided                             Pertinent Vitals/Pain Pain Assessment: No/denies pain    Home Living Family/patient expects to be discharged to:: Private residence Living Arrangements: Children Available Help at Discharge: Family;Available 24 hours/day Type of Home: House Home Access: Stairs to enter Entrance Stairs-Rails: None Entrance Stairs-Number of Steps: 2 Home Layout: One level Home Equipment: Walker - 4 wheels  Prior Function Level of Independence: Needs assistance   Gait / Transfers Assistance Needed: pt reports walking without AD most of the time, but occasionally needs rollator  ADL's / Homemaking Assistance Needed: pt  reports independence with ADL tasks  Comments: Most of history obtained from PT/OT notes ~1 month ago, pt states she lives with her mother and two sisters who are in their 1s-30s     Hand Dominance   Dominant Hand: Right    Extremity/Trunk Assessment   Upper Extremity Assessment Upper Extremity Assessment: Generalized weakness    Lower Extremity Assessment Lower Extremity Assessment: Generalized weakness    Cervical / Trunk Assessment Cervical / Trunk Assessment: Normal  Communication   Communication: HOH  Cognition Arousal/Alertness: Awake/alert Behavior During Therapy: WFL for tasks assessed/performed Overall Cognitive Status: History of cognitive impairments - at baseline Area of Impairment: Safety/judgement;Following commands;Awareness;Problem solving                       Following Commands: Follows one step commands with increased time;Follows one step commands inconsistently Safety/Judgement: Decreased awareness of deficits;Decreased awareness of safety Awareness: Emergent Problem Solving: Decreased initiation;Difficulty sequencing;Requires verbal cues;Requires tactile cues General Comments: A&Ox4, dementia at baseline. Pt stating she lives at home with her mother and sisters who are in their 81s-30s, but responds "yes" when asked if she lives with daughter. Poor safety awareness during mobility requiring multimodal cuing to correct, also motor planning issues i.e. pt wheeling RW over toilet when attempting to sit on toilet.      General Comments General comments (skin integrity, edema, etc.): + orthostatics    Exercises     Assessment/Plan    PT Assessment Patient needs continued PT services  PT Problem List Decreased strength;Decreased mobility;Decreased balance;Decreased activity tolerance;Decreased knowledge of use of DME;Decreased cognition;Cardiopulmonary status limiting activity;Decreased safety awareness       PT Treatment Interventions DME  instruction;Therapeutic activities;Therapeutic exercise;Patient/family education;Gait training;Balance training;Functional mobility training;Neuromuscular re-education    PT Goals (Current goals can be found in the Care Plan section)  Acute Rehab PT Goals Patient Stated Goal: go home PT Goal Formulation: With patient Time For Goal Achievement: 08/23/19 Potential to Achieve Goals: Good    Frequency Min 2X/week   Barriers to discharge Decreased caregiver support      Co-evaluation               AM-PAC PT "6 Clicks" Mobility  Outcome Measure Help needed turning from your back to your side while in a flat bed without using bedrails?: A Little Help needed moving from lying on your back to sitting on the side of a flat bed without using bedrails?: A Little Help needed moving to and from a bed to a chair (including a wheelchair)?: A Little Help needed standing up from a chair using your arms (e.g., wheelchair or bedside chair)?: A Little Help needed to walk in hospital room?: A Little Help needed climbing 3-5 steps with a railing? : A Lot 6 Click Score: 17    End of Session Equipment Utilized During Treatment: Gait belt Activity Tolerance: Patient tolerated treatment well;Patient limited by fatigue Patient left: in bed;with call bell/phone within reach;with bed alarm set Nurse Communication: Mobility status PT Visit Diagnosis: Other abnormalities of gait and mobility (R26.89);Muscle weakness (generalized) (M62.81)    Time: 6644-0347 PT Time Calculation (min) (ACUTE ONLY): 31 min   Charges:   PT Evaluation $PT Eval Low Complexity: 1 Low PT Treatments $Gait Training: 8-22 mins  [place] Pager [phone]  Office [phone]  [place] 08/09/2019, 10:16 AM

## 2019-08-09 NOTE — Progress Notes (Signed)
Initial Nutrition Assessment  DOCUMENTATION CODES:   Not applicable  INTERVENTION:  Feeding assistance with all meals  Magic cup TID with meals, each supplement provides 290 kcal and 9 grams of protein  MVI daily  Continue Ensure Enlive po BID, each supplement provides 350 kcal and 20 grams of protein   NUTRITION DIAGNOSIS:   Increased nutrient needs related to wound healing as evidenced by estimated needs.    GOAL:   Patient will meet greater than or equal to 90% of their needs    MONITOR:   PO intake, Supplement acceptance, Skin, Weight trends, Labs, I & O's  REASON FOR ASSESSMENT:   Malnutrition Screening Tool    ASSESSMENT:   Pt presented with near syncope episodes. PMH includes advanced dementia, HTN, HLD, hypothyroidism, CKD stage III.   Pt unavailable at time of RD visit.   Per H&P, pt's daughter reported that pt requires close monitoring and encouragement to eat and drink. Recently, the pt's daughter has not been able to visit the pt as much to supervise meals. Suspect the pt is malnourished given advanced dementia and likely reduced po intake; however, unable to diagnose without detailed diet/wt history and/or nutrition-focused physical exam.   No PO intake documented.  Labs: Na 146(H) Medications: Ensure Enlive BID, IV albumin  NUTRITION - FOCUSED PHYSICAL EXAM:  Unable to perform at this time, will attempt at follow-up.  Diet Order:   Diet Order            Diet regular Room service appropriate? Yes; Fluid consistency: Thin  Diet effective now                 EDUCATION NEEDS:   Not appropriate for education at this time  Skin:  Skin Assessment: Skin Integrity Issues: Skin Integrity Issues:: Stage II Stage II: L/R buttocks  Last BM:  PTA  Height:   Ht Readings from Last 1 Encounters:  08/09/19 5' (1.524 m)    Weight:   Wt Readings from Last 1 Encounters:  08/09/19 64.7 kg    BMI:  Body mass index is 27.86  kg/m.  Estimated Nutritional Needs:   Kcal:  1450-1650  Protein:  75-90 grams  Fluid:  >/=1.45L/d    Larkin Ina, MS, RD, LDN RD pager number and weekend/on-call pager number located in [place].

## 2019-08-09 NOTE — Progress Notes (Signed)
Right lower extremity DVT preliminary report showing acute DVT in right popliteal, femoral and peroneal veins, not in right tibial vein. Discussed with patient's daughter about the diagnosis and the standard treatment plan of anticoagulation.  Discussed the risk and benefit of anticoagulation based on patient's age and recurrent syncope. Patient may be a candidate for IVC filter.  Paged IR. For now, I will start the patient on full dose Lovenox till patient gets the IVC filter placed.   Patient presented with syncope, no symptom of chest pain, shortness of breath. She is at risk of having a pulmonary edema but at this time, she does not seem to have any indication of underlying pulmonary embolism.

## 2019-08-09 NOTE — Progress Notes (Signed)
PROGRESS NOTE  Kristina [name]  DOB: June 30, 1931  PCP: Midge Minium, MD GUY:[id number]  DOA: 08/08/2019  LOS: 0 days   Chief Complaint  Patient presents with  . Loss of Consciousness   Brief narrative: Kristina [name] is a 86 y.o. female with PMH significant for advanced dementia, hypertension, hyperlipidemia, hypothyroidism, CKD stage III who presented to ED on 7/28 with near syncopeepisodes.  Patient is advanced baseline dementia, most of the history provided by patient's daughter at the bedside.  Patient was admitted about 4 weeks ago for similar complaints..  During that admission, she was noted to have orthostatic hypotension, midodrine was started.  However daughter states patient never took midodrine.  Patient has poor oral intake and requires frequent reminders and supervision.  Daughter has been quite tied up with her job recently,and unable to supervise her as before.  On the day of admission, patient complained of lightheadedness, wobbly gait and had 1 episode of syncope.  Daughter was able to hold her down and hence the patient did not have significant injuries.   EMS found patient to be tachycardia and hypotensive.  The ED, patient was afebrile, heart rate 109, blood pressure 98/61.  Both improved after IV hydration.   Labs showed creatinine 1.43 which is at baseline. Troponin elevated to 182>>654>>455 Lactic acid 1.4 WBC 13.8  Subjective: Patient was seen and examined this morning.  Pleasant elderly Caucasian female.  Sitting up at the edge of the bed.  Working with physical therapy.  Not in distress. Chart reviewed. Labs from this morning with sodium elevated to 146, creatinine improving to 1.15  Assessment/Plan: Recurrent syncope -Secondary to orthostatic hypotension, poor oral intake. -No evidence of infection. -Cardiology consultation was obtained. -Patient received aggressive hydration.  She also received IV albumin. -We will allow her systolic blood  pressure to drift to 140s to 160s. -Advised for compression stockings and abdominal binder -Per cardiology, If these conservative interventions do not provide relief of symptoms, midodrine can be considered, but it is important to impress upon the patient that she cannot lie down for 4 hours after taking that medication.  Elevated troponin -Secondary to prolonged hypotension -Troponin peaked at 654, downtrending now. -No chest pain or shortness of breath.   -Not a candidate for revascularization status.  Generalized weakness -PT evaluation obtained.  SNF recommended.  Apparently, patient was recently at an SNF for 4 weeks and was discharged to home.  She was at home for only about a week and ended up coming back to the hospital.  Right lower extremity swelling -Ultrasound duplex scheduled.  CKD 3 -Creatinine at baseline  Leukocytosis -No evidence of infection, lactic acid normal.  Mobility: PT eval Code Status:   Code Status: DNR  Nutritional status: Body mass index is 27.86 kg/m. Nutrition Problem: Increased nutrient needs Etiology: wound healing Signs/Symptoms: estimated needs Diet Order            Diet regular Room service appropriate? Yes; Fluid consistency: Thin  Diet effective now               . DVT prophylaxis: heparin injection 5,000 Units Start: 08/08/19 2200   Antimicrobials:  None Fluid: Normal saline at 75 mL/h  Consultants: Cardiology Family Communication:  None at bedside  Status is: Observation  The patient will require care spanning > 2 midnights and should be moved to inpatient because: IV treatments appropriate due to intensity of illness or inability to take PO  Dispo: The patient is from: Home  Anticipated d/c is to: SNF              Anticipated d/c date is: 2 days              Patient currently is not medically stable to d/c.  Patient is weak, requires IV hydration, SNF placement.  Unable to discharge to home today.  Switch to  inpatient status.   Infusions:  . sodium chloride    . albumin human 12.5 g (08/09/19 1300)    Scheduled Meds: . feeding supplement (ENSURE ENLIVE)  237 mL Oral BID BM  . heparin  5,000 Units Subcutaneous Q12H  . levothyroxine  75 mcg Oral QAC breakfast  . memantine  10 mg Oral BID  . multivitamin with minerals  1 tablet Oral Daily  . sodium chloride flush  3 mL Intravenous Q12H    Antimicrobials: Anti-infectives (From admission, onward)   None      PRN meds: acetaminophen **OR** acetaminophen, fluticasone   Objective: Vitals:   08/09/19 0350 08/09/19 0808  BP: (!) 134/79 (!) 114/96  Pulse: 78 80  Resp: 13 13  Temp: 98.7 F (37.1 C) (!) 97.5 F (36.4 C)  SpO2: 100% 98%    Intake/Output Summary (Last 24 hours) at 08/09/2019 1348 Last data filed at 08/09/2019 0400 Gross per 24 hour  Intake 708.41 ml  Output --  Net 708.41 ml   Filed Weights   08/09/19 0000  Weight: 64.7 kg   Weight change:  Body mass index is 27.86 kg/m.   Physical Exam: General exam: Appears calm and comfortable.  Not in physical distress Skin: No rashes, lesions or ulcers. HEENT: Atraumatic, normocephalic, supple neck, no obvious bleeding Lungs: Clear to auscultation bilaterally CVS: Regular rate and rhythm, no murmur GI/Abd soft, nontender, nondistended, bowel sound present CNS: Alert, awake, oriented to place Psychiatry: Depressed look Extremities: No pedal edema, no calf tenderness  Data Review: I have personally reviewed the laboratory data and studies available.  Recent Labs  Lab 08/08/19 1341  WBC 13.8*  NEUTROABS 11.4*  HGB 12.4  HCT 40.5  MCV 103.1*  PLT 237   Recent Labs  Lab 08/08/19 1341 08/09/19 0507  NA 145 146*  K 3.7 3.7  CL 110 111  CO2 22 22  GLUCOSE 148* 99  BUN 31* 29*  CREATININE 1.47* 1.15*  CALCIUM 8.9 8.3*  MG 1.9  --    Lab Results  Component Value Date   HGBA1C 6.5 08/06/2016       Component Value Date/Time   CHOL 128 07/05/2019  0923   TRIG 136.0 07/05/2019 0923   HDL 34.10 (L) 07/05/2019 0923   CHOLHDL 4 07/05/2019 0923   VLDL 27.2 07/05/2019 0923   LDLCALC 67 07/05/2019 0923   LDLCALC 72 03/14/2018 0938   LDLDIRECT 115.0 08/06/2016 1034   Signed, Terrilee Croak, MD [facility] Pager: [phone] (Secure Chat preferred). 08/09/2019

## 2019-08-09 NOTE — Progress Notes (Signed)
ANTICOAGULATION CONSULT NOTE - Initial Consult  Pharmacy Consult:  Lovenox Indication:  Acute RLE DVT  Allergies  Allergen Reactions  . Bee Venom Shortness Of Breath, Itching, Rash and Other (See Comments)    Almost passed out, also  . Penicillins Rash    Has patient had a PCN reaction causing immediate rash, facial/tongue/throat swelling, SOB or lightheadedness with hypotension: Yes Has patient had a PCN reaction causing severe rash involving mucus membranes or skin necrosis: No Has patient had a PCN reaction that required hospitalization: Unknown Has patient had a PCN reaction occurring within the last 10 years: No If all of the above answers are "NO", then may proceed with Cephalosporin use.     Patient Measurements: Height: 5' (152.4 cm) (per pt state) Weight: 64.7 kg (142 lb 10.2 oz) IBW/kg (Calculated) : 45.5  Vital Signs: Temp: 98 F (36.7 C) (07/29 1632) BP: 121/63 (07/29 1632) Pulse Rate: 92 (07/29 1632)  Labs: Recent Labs    08/08/19 1341 08/08/19 1622 08/09/19 0507  HGB 12.4  --   --   HCT 40.5  --   --   PLT 237  --   --   CREATININE 1.47*  --  1.15*  CKTOTAL  --   --  44  TROPONINIHS 182* 654* 455*    Estimated Creatinine Clearance: 30 mL/min (A) (by C-G formula based on SCr of 1.15 mg/dL (H)).   Medical History: Past Medical History:  Diagnosis Date  . Benign paroxysmal positional vertigo   . Diverticulosis   . Duodenal ulcer   . Endometrial cancer [facility])    age 86  . GERD (gastroesophageal reflux disease)   . Hypertension   . Hypothyroidism   . Memory loss   . Peripheral neuropathy   . Tubulovillous adenoma polyp of colon 04/1998    Assessment: 49 YOF presented with weakness and s/p fall.  Found to have an acute RLE DVT and Pharmacy consulted to initiate full dose Lovenox.  Patient has been on heparin SQ and last dose was around 1000 today.  Patient has CKD.  Renal function is improving; however, with advanced age and borderline renal  clearance, will dose Lovenox daily.  No bleeding reported.  Goal of Therapy:  Anti-Xa level 0.6-1 units/ml 4hrs after LMWH dose given Monitor platelets by anticoagulation protocol: Yes   Plan:  D/C heparin SQ Lovenox 65mg  SQ Q24H Monitor renal fxn, clinical progress CBC Q72H while on Lovenox Consider checking anti-Xa level at Css  F/U plans for IVC filter  Annete Ayuso D. Mina Marble, PharmD, BCPS, [place] 08/09/2019, 4:55 PM

## 2019-08-09 NOTE — Progress Notes (Signed)
VASCULAR LAB PRELIMINARY  PRELIMINARY  PRELIMINARY  PRELIMINARY  Bilateral lower extremity venous duplex completed.    Preliminary report:  See CV proc for preliminary results.  Messaged Dr. Pietro Cassis with results.  Greta Yung, RVT 08/09/2019, 5:32 PM

## 2019-08-09 NOTE — TOC Initial Note (Addendum)
Transition of Care [facility]) - Initial/Assessment Note    Patient Details  Name: Kristina [name] MRN: [id number] Date of Birth: 10-01-1931  Transition of Care [facility]) CM/SW Contact:    Emeterio Reeve, [place] Phone Number: 08/09/2019, 2:38 PM  Clinical Narrative:                  CSW met with pt at bedside. CSW introduced self and explained her role at the hospital.  Pts daughter Kristina [name] was present. Kristina [name] stated that pt was recently in the hospital and was sent to [facility] for rehab. Pt completed rehab and went home for two weeks and is currently back in the hospital for the same reasons as before.   Prior to last admission, pt was living with her daughter Kristina [name]. Kristina [name] works during the day and is unable to provide adequate supervision. Kristina [name] stated that they plan on hiring aides to assist in pts care once she finishes rehab at [facility].   Pt and daughter hope pt to return to [facility] for rehab.   CSW will continue to follow  Expected Discharge Plan: [place] Barriers to Discharge: Continued Medical Work up   Patient Goals and CMS Choice Patient states their goals for this hospitalization and ongoing recovery are:: To get better CMS Medicare.gov Compare Post Acute Care list provided to:: Patient Choice offered to / list presented to : Patient  Expected Discharge Plan and Services Expected Discharge Plan: [place]       Living arrangements for the past 2 months: Single Family Home, [place]                                      Prior Living Arrangements/Services Living arrangements for the past 2 months: [place], [place] Lives with:: Facility Resident, Adult Children Patient language and need for interpreter reviewed:: Yes        Need for Family Participation in Patient Care: Yes (Comment)   Current home services: DME Criminal Activity/Legal Involvement Pertinent to Current Situation/Hospitalization:  No - Comment as needed  Activities of Daily Living Home Assistive Devices/Equipment: None ADL Screening (condition at time of admission) Patient's cognitive ability adequate to safely complete daily activities?: No Is the patient deaf or have difficulty hearing?: No Does the patient have difficulty seeing, even when wearing glasses/contacts?: No Does the patient have difficulty concentrating, remembering, or making decisions?: Yes Patient able to express need for assistance with ADLs?: No Does the patient have difficulty dressing or bathing?: Yes Independently performs ADLs?: No Communication: Appropriate for developmental age Dressing (OT): Needs assistance Is this a change from baseline?: Change from baseline, expected to last <3days Grooming: Needs assistance Is this a change from baseline?: Change from baseline, expected to last <3 days Feeding: Needs assistance Is this a change from baseline?: Change from baseline, expected to last <3 days Bathing: Needs assistance Is this a change from baseline?: Change from baseline, expected to last <3 days Toileting: Needs assistance Is this a change from baseline?: Change from baseline, expected to last <3 days In/Out Bed: Needs assistance Is this a change from baseline?: Change from baseline, expected to last <3 days Walks in Home: Independent with device (comment) Does the patient have difficulty walking or climbing stairs?: Yes Weakness of Legs: Both Weakness of Arms/Hands: Both  Permission Sought/Granted   Permission granted to share information with :  Yes, Verbal Permission Granted  Share Information with NAME: [name] and Kristina [name]  Permission granted to share info w AGENCY: SNF  Permission granted to share info w Relationship: Daughters  Permission granted to share info w Contact Information: listed in chart  Emotional Assessment Appearance:: Appears stated age Attitude/Demeanor/Rapport: Engaged Affect (typically observed):  Appropriate Orientation: : Oriented to Self Alcohol / Substance Use: Not Applicable Psych Involvement: No (comment)  Admission diagnosis:  Dehydration [E86.0] Syncope [R55] Elevated troponin [R77.8] Syncope, unspecified syncope type [R55] Patient Active Problem List   Diagnosis Date Noted  . Syncope 08/08/2019  . Elevated troponin   . Pressure injury of skin 07/10/2019  . Orthostatic hypotension 07/09/2019  . Macrocytic anemia 07/09/2019  . Thrombocytopenia ([place]) 07/09/2019  . Pressure injury of buttock, stage 3 ([place]) 07/05/2019  . Weakness 07/15/2018  . Dehydration 07/15/2018  . Acute on chronic kidney failure ([place]) 07/15/2018  . Hypertriglyceridemia 02/07/2017  . Vitamin D deficiency 08/06/2016  . Physical exam 08/06/2016  . Abnormality of gait 04/23/2016  . Hypothyroidism 02/05/2016  . HTN (hypertension) 02/05/2016  . Dementia ([place]) 09/23/2015  . Paresthesia 01/28/2014  . Hyperreflexia 01/28/2014  . GERD 01/16/2008  . OTHER POSTOPERATIVE FUNCTIONAL DISORDERS 01/16/2008  . TUBULOVILLOUS ADENOMA, COLON, HX OF 01/10/2008   PCP:  Midge Minium, MD Pharmacy:   [id number] [place], [place] - [address] [place] [address]Phone: [phone]: [phone]    Social Determinants of Health (SDOH) Interventions    Readmission Risk Interventions Readmission Risk Prevention Plan 07/10/2019  Post Dischage Appt Not Complete  Appt Comments plan for SNF  Medication Screening Complete  Transportation Screening Complete  PCP or Specialist Appt within 5-7 Days Not Complete  Not Complete comments plan for SNF  Home Care Screening Complete  Medication Review (RN CM) Referral to Pharmacy  Some recent data might be hidden   MEmeterio Reeve LLatanya Presser LChickenSocial Worker [phone]

## 2019-08-09 NOTE — Progress Notes (Signed)
Pt continues to sundown. She has removed telemetry 5 times in last 2 hours. Informed Dahal MD. Tele to be on stand by overnight as pt refuses to wear it. RN will continue to monitor pt.

## 2019-08-10 ENCOUNTER — Inpatient Hospital Stay (HOSPITAL_COMMUNITY): Payer: Medicare Other

## 2019-08-10 DIAGNOSIS — R55 Syncope and collapse: Secondary | ICD-10-CM | POA: Diagnosis not present

## 2019-08-10 DIAGNOSIS — I824Y1 Acute embolism and thrombosis of unspecified deep veins of right proximal lower extremity: Secondary | ICD-10-CM | POA: Diagnosis not present

## 2019-08-10 HISTORY — PX: IR IVC FILTER PLMT / S&I /IMG GUID/MOD SED: IMG701

## 2019-08-10 LAB — BASIC METABOLIC PANEL
Anion gap: 11 (ref 5–15)
BUN: 26 mg/dL — ABNORMAL HIGH (ref 8–23)
CO2: 25 mmol/L (ref 22–32)
Calcium: 8.9 mg/dL (ref 8.9–10.3)
Chloride: 110 mmol/L (ref 98–111)
Creatinine, Ser: 0.92 mg/dL (ref 0.44–1.00)
GFR calc Af Amer: >60 mL/min (ref >60)
GFR calc non Af Amer: 57 mL/min — ABNORMAL LOW (ref >60)
Glucose, Bld: 131 mg/dL — ABNORMAL HIGH (ref 70–99)
Potassium: 3.5 mmol/L (ref 3.5–5.1)
Sodium: 146 mmol/L — ABNORMAL HIGH (ref 135–145)

## 2019-08-10 LAB — CBC
HCT: 33.8 % — ABNORMAL LOW (ref 36.0–46.0)
Hemoglobin: 10.9 g/dL — ABNORMAL LOW (ref 12.0–15.0)
MCH: 32.1 pg (ref 26.0–34.0)
MCHC: 32.2 g/dL (ref 30.0–36.0)
MCV: 99.4 fL (ref 80.0–100.0)
Platelets: 228 10*3/uL (ref 150–400)
RBC: 3.4 MIL/uL — ABNORMAL LOW (ref 3.87–5.11)
RDW: 14.5 % (ref 11.5–15.5)
WBC: 11.3 10*3/uL — ABNORMAL HIGH (ref 4.0–10.5)
nRBC: 0 % (ref 0.0–0.2)

## 2019-08-10 LAB — GLUCOSE, CAPILLARY: Glucose-Capillary: 101 mg/dL — ABNORMAL HIGH (ref 70–99)

## 2019-08-10 MED ORDER — LIDOCAINE HCL 1 % IJ SOLN
INTRAMUSCULAR | Status: AC
  Filled 2019-08-10: qty 20

## 2019-08-10 MED ORDER — LIDOCAINE HCL 1 % IJ SOLN
INTRAMUSCULAR | Status: AC | PRN
  Administered 2019-08-10: 10 mL

## 2019-08-10 MED ORDER — MIDAZOLAM HCL 2 MG/2ML IJ SOLN
INTRAMUSCULAR | Status: AC
  Filled 2019-08-10: qty 2

## 2019-08-10 MED ORDER — IOHEXOL 300 MG/ML  SOLN
100.0000 mL | Freq: Once | INTRAMUSCULAR | Status: AC | PRN
  Administered 2019-08-10: 35 mL via INTRAVENOUS

## 2019-08-10 MED ORDER — FENTANYL CITRATE (PF) 100 MCG/2ML IJ SOLN
INTRAMUSCULAR | Status: AC | PRN
  Administered 2019-08-10: 25 ug via INTRAVENOUS

## 2019-08-10 MED ORDER — FENTANYL CITRATE (PF) 100 MCG/2ML IJ SOLN
INTRAMUSCULAR | Status: AC
  Filled 2019-08-10: qty 2

## 2019-08-10 MED ORDER — MIDAZOLAM HCL 2 MG/2ML IJ SOLN
INTRAMUSCULAR | Status: AC | PRN
  Administered 2019-08-10: 0.5 mg via INTRAVENOUS

## 2019-08-10 NOTE — Procedures (Signed)
Pre procedural Dx: DVT; poor candidate for anticoagulation Post Procedural Dx: Same  Successful placement of an infrarenal IVC filter via the R IJ vein.  EBL: Trace  Complications: None immediate  Ronny Bacon, MD Pager #: [phone]

## 2019-08-10 NOTE — Progress Notes (Addendum)
PROGRESS NOTE  Kristina [name]  DOB: December 03, 1931  PCP: Midge Minium, MD VOJ:[id number]  DOA: 08/08/2019  LOS: 1 day   Chief Complaint  Patient presents with   Loss of Consciousness   Brief narrative: Kristina [name] is a 86 y.o. female with PMH significant for advanced dementia, hypertension, hyperlipidemia, hypothyroidism, CKD stage III who presented to ED on 7/28 with near syncopeepisodes.  Patient is advanced baseline dementia, most of the history provided by patient's daughter at the bedside.  Patient was admitted about 4 weeks ago for similar complaints..  During that admission, she was noted to have orthostatic hypotension, midodrine was started.  However daughter states patient never took midodrine.  Patient has poor oral intake and requires frequent reminders and supervision.  Daughter has been quite tied up with her job recently,and unable to supervise her as before.  On the day of admission, patient complained of lightheadedness, wobbly gait and had 1 episode of syncope.  Daughter was able to hold her down and hence the patient did not have significant injuries.   EMS found patient to be tachycardia and hypotensive.  The ED, patient was afebrile, heart rate 109, blood pressure 98/61.  Both improved after IV hydration.   Labs showed creatinine 1.43 which is at baseline. Troponin elevated to 182>>654>>455 Lactic acid 1.4 WBC 13.8 Ultrasound duplex of lower extremities showed DVT in the right femoral, right popliteal and right peroneal vein.  Subjective: Patient was seen and examined this morning.   Lying down in bed.  Not in distress.  Slow to respond.  Not in pain.   Labs this morning with sodium 146, WBC 11.3, hemoglobin 10.9  Assessment/Plan: Recurrent syncope -Secondary to orthostatic hypotension, poor oral intake. -No evidence of infection. -Cardiology consultation was obtained. -Patient received aggressive hydration.  She also received IV albumin. -We  will allow her systolic blood pressure to drift to 140s to 160s. -Advised for compression stockings and abdominal binder -Per cardiology, If these conservative interventions do not provide relief of symptoms, midodrine can be considered, but it is important to impress upon the patient that she cannot lie down for 4 hours after taking that medication.  Acute DVT -Of right femoral, right popliteal and right peroneal vein. -Full dose of Lovenox started last night.  Discussed with patient, daughter and IR.  Patient is not a good candidate for long-term anticoagulation because of frequent syncope and falls. -She is planned for IVC filter today by IR.  Elevated troponin -Secondary to prolonged hypotension -Troponin peaked at 654, downtrending now. -No chest pain or shortness of breath.   -Not a candidate for revascularization status.  Generalized weakness -PT evaluation obtained.  SNF recommended.  Apparently, patient was recently at an SNF for 4 weeks and was discharged to home.  She was at home for only about a week and ended up coming back to the hospital.  AKI on CKD 3a-3b -GFR baseline 32-57, presented with a creatinine of 1.47, improved to 0.92 today.  Leukocytosis -No evidence of infection, lactic acid normal.  WBC count trending down.  Mobility: PT eval Code Status:   Code Status: DNR  Nutritional status: Body mass index is 27.86 kg/m. Nutrition Problem: Increased nutrient needs Etiology: wound healing Signs/Symptoms: estimated needs Diet Order            Diet NPO time specified  Diet effective midnight               . DVT prophylaxis:    Antimicrobials:  None Fluid: Normal saline at 75 mL/h to continue till tomorrow.  Plan to reassess in the morning.  Consultants: Cardiology Family Communication:  None at bedside  Status is: Observation  The patient will require care spanning > 2 midnights and should be moved to inpatient because: IV treatments appropriate due to  intensity of illness or inability to take PO  Dispo: The patient is from: Home              Anticipated d/c is to: SNF              Anticipated d/c date is: 2 days              Patient currently is not medically stable to d/c.  Patient is undergoing IVC filter placement today.   Infusions:   sodium chloride 75 mL/hr at 08/10/19 1213    Scheduled Meds:  enoxaparin (LOVENOX) injection  65 mg Subcutaneous Q24H   feeding supplement (ENSURE ENLIVE)  237 mL Oral BID BM   levothyroxine  75 mcg Oral QAC breakfast   memantine  10 mg Oral BID   multivitamin with minerals  1 tablet Oral Daily   sodium chloride flush  3 mL Intravenous Q12H    Antimicrobials: Anti-infectives (From admission, onward)   None      PRN meds: acetaminophen **OR** acetaminophen, fluticasone   Objective: Vitals:   08/09/19 2248 08/10/19 0743  BP: 124/68 (!) 133/57  Pulse: 88 83  Resp: 18 18  Temp: 98.1 F (36.7 C) 98.3 F (36.8 C)  SpO2: 98% 97%    Intake/Output Summary (Last 24 hours) at 08/10/2019 1314 Last data filed at 08/10/2019 0652 Gross per 24 hour  Intake 1141.23 ml  Output 550 ml  Net 591.23 ml   Filed Weights   08/09/19 0000  Weight: 64.7 kg   Weight change:  Body mass index is 27.86 kg/m.   Physical Exam: General exam: Appears calm and comfortable.  Not in physical distress Skin: No rashes, lesions or ulcers. HEENT: Atraumatic, normocephalic, supple neck, no obvious bleeding Lungs: Clear to auscultation bilaterally CVS: Regular rate and rhythm, no murmur GI/Abd soft, nontender, nondistended, bowel sound present CNS: Alert, awake, oriented to place.  Slow to respond. Psychiatry: Depressed look Extremities: Right trace pedal edema, left side normal, no calf tenderness.  Data Review: I have personally reviewed the laboratory data and studies available.  Recent Labs  Lab 08/08/19 1341 08/10/19 0138  WBC 13.8* 11.3*  NEUTROABS 11.4*  --   HGB 12.4 10.9*  HCT 40.5  33.8*  MCV 103.1* 99.4  PLT 237 228   Recent Labs  Lab 08/08/19 1341 08/09/19 0507 08/10/19 0138  NA 145 146* 146*  K 3.7 3.7 3.5  CL 110 111 110  CO2 22 22 25   GLUCOSE 148* 99 131*  BUN 31* 29* 26*  CREATININE 1.47* 1.15* 0.92  CALCIUM 8.9 8.3* 8.9  MG 1.9  --   --    Lab Results  Component Value Date   HGBA1C 6.5 08/06/2016       Component Value Date/Time   CHOL 128 07/05/2019 0923   TRIG 136.0 07/05/2019 0923   HDL 34.10 (L) 07/05/2019 0923   CHOLHDL 4 07/05/2019 0923   VLDL 27.2 07/05/2019 0923   LDLCALC 67 07/05/2019 0923   LDLCALC 72 03/14/2018 0938   LDLDIRECT 115.0 08/06/2016 1034   Signed, Terrilee Croak, MD [facility] Pager: [phone] (Secure Chat preferred). 08/10/2019

## 2019-08-10 NOTE — TOC Progression Note (Signed)
Transition of Care [facility]) - Progression Note    Patient Details  Name: EMOJEAN GERTZ MRN: [id number] Date of Birth: 10-15-1931  Transition of Care [facility]) CM/SW [place], [place] Phone Number: 08/10/2019, 4:36 PM  Clinical Narrative:     CSW informed Joelene Millin that [facility] has no bed availability at this time. CSW gave pt snf choices from [email or web address] Joelene Millin stated she will review with family and make a decision.   Joelene Millin asked about alternatives to SNF. CSW informed her if pt goes home its recommended that she have 24 hour supervision. Joelene Millin stated that they where already considering having aids come in to the home. Joelene Millin stated she will discuss with her siblings. Joelene Millin stated that [name] should be primary contact, and her second  moving forward.   Expected Discharge Plan: [place] Barriers to Discharge: Continued Medical Work up  Expected Discharge Plan and Services Expected Discharge Plan: [place] arrangements for the past 2 months: [place], [place]                                       Social Determinants of Health (SDOH) Interventions    Readmission Risk Interventions Readmission Risk Prevention Plan 07/10/2019  Post Dischage Appt Not Complete  Appt Comments plan for SNF  Medication Screening Complete  Transportation Screening Complete  PCP or Specialist Appt within 5-7 Days Not Complete  Not Complete comments plan for SNF  Home Care Screening Complete  Medication Review (RN CM) Referral to Pharmacy  Some recent data might be hidden   Emeterio Reeve, Latanya Presser, [place] Social Worker [phone]

## 2019-08-10 NOTE — H&P (Signed)
Chief Complaint: Patient was seen in consultation today for  Chief Complaint  Patient presents with  . Loss of Consciousness    Referring Physician(s): Dr. Pietro Cassis  Supervising Physician: Sandi Mariscal  Patient Status: [facility] - In-pt  History of Present Illness: Kristina [name] is an 86 y.o. female with a medical history that includes HTN, CKD III, hyperlipidemia and advanced dementia. She presented to the [facility] ED 08/08/19 with near syncopal episodes, secondary to orthostatic hypotension and poor oral intake. Right lower extremity swelling noticed and ultrasound imaging revealed: Findings consistent with acute deep vein thrombosis involving the right  femoral vein, right popliteal vein, right posterior tibial veins, and right peroneal veins.  Interventional Radiology has been asked to evaluate th is patient for the placement of an inferior vena cava filter.   Past Medical History:  Diagnosis Date  . Benign paroxysmal positional vertigo   . Diverticulosis   . Duodenal ulcer   . Endometrial cancer [facility])    age 73  . GERD (gastroesophageal reflux disease)   . Hypertension   . Hypothyroidism   . Memory loss   . Peripheral neuropathy   . 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 CATARACT EXTRACTION    . CHOLECYSTECTOMY    . FOOT SURGERY     Right  . KNEE SURGERY     Right  . VAGINAL HYSTERECTOMY      Allergies: Bee venom and Penicillins  Medications: Prior to Admission medications   Medication Sig Start Date End Date Taking? Authorizing Provider  diphenhydrAMINE (BENADRYL) 25 MG tablet Take 2 tablets (50 mg total) by mouth every 6 (six) hours as needed for itching (or hives). 06/14/16  Yes Forde Dandy, MD  EPINEPHrine 0.3 mg/0.3 mL IJ SOAJ injection Inject 0.3 mLs (0.3 mg total) into the muscle once as needed for up to 1 dose (severe allergic reaction). 01/26/19  Yes Midge Minium, MD  feeding supplement,  ENSURE ENLIVE, (ENSURE ENLIVE) LIQD Take 237 mLs by mouth 2 (two) times daily between meals. 07/12/19  Yes Hosie Poisson, MD  fluticasone (FLONASE) 50 MCG/ACT nasal spray Place 2 sprays into both nostrils daily as needed (for seasonal allergies). SPRAY 2 SPRAYS INTO EACH NOSTRIL EVERY DAY 07/17/18  Yes Geradine Girt, DO  levothyroxine (SYNTHROID) 75 MCG tablet TAKE 1 TABLET BY MOUTH EVERY DAY Patient taking differently: Take 75 mcg by mouth daily before breakfast.  03/07/19  Yes Midge Minium, MD  loperamide (IMODIUM) 2 MG capsule Take 2 mg by mouth as needed for diarrhea or loose stools.    Yes [provider]  memantine (NAMENDA) 10 MG tablet TAKE 1 TABLET BY MOUTH TWICE A DAY. Please call [phone] to scheduled a yearly appt or may also request refills from PCP. Patient taking differently: Take 10 mg by mouth 2 (two) times daily. TAKE 1 TABLET BY MOUTH TWICE A DAY. Please call [phone] to scheduled a yearly appt or may also request refills from PCP. 08/03/19  Yes Marcial Pacas, MD  [employer] D3 50 MCG (2000 UT) CAPS TAKE 1 CAPSULE DAILY Patient not taking: Reported on 08/08/2019 01/02/18   Midge Minium, MD  midodrine (PROAMATINE) 2.5 MG tablet Take 1 tablet (2.5 mg total) by mouth 2 (two) times daily with a meal. 07/12/19   Hosie Poisson, MD  Multiple Vitamin (MULTIVITAMIN WITH MINERALS) TABS tablet Take 1 tablet by mouth daily. Patient not taking: Reported on 08/08/2019  07/12/19   Hosie Poisson, MD     Family History  Problem Relation Age of Onset  . Dementia Mother   . Heart attack Father   . Heart disease Sister   . Breast cancer Sister 61  . Cancer Brother   . Colon cancer Neg Hx     Social History   Socioeconomic History  . Marital status: Married    Spouse name: Not on file  . Number of children: 3  . Years of education: 32  . Highest education level: Not on file  Occupational History  . Occupation: Retired  Tobacco Use  . Smoking status: Never Smoker  . Smokeless  tobacco: Never Used  Vaping Use  . Vaping Use: Never used  Substance and Sexual Activity  . Alcohol use: No    Alcohol/week: 0.0 standard drinks  . Drug use: No  . Sexual activity: Not Currently  Other Topics Concern  . Not on file  Social History Narrative   Lives with her daughter, Kristina [name].   Widowed.   Retired.   Right hand.   Three children.   High school education.   2 cups coffee daily.   Social Determinants of Health   Financial Resource Strain:   . Difficulty of Paying Living Expenses:   Food Insecurity: No Food Insecurity  . Worried About Charity fundraiser in the Last Year: Never true  . Ran Out of Food in the Last Year: Never true  Transportation Needs:   . Lack of Transportation (Medical):   Marland Kitchen Lack of Transportation (Non-Medical):   Physical Activity:   . Days of Exercise per Week:   . Minutes of Exercise per Session:   Stress:   . Feeling of Stress :   Social Connections:   . Frequency of Communication with Friends and Family:   . Frequency of Social Gatherings with Friends and Family:   . Attends Religious Services:   . Active Member of Clubs or Organizations:   . Attends Archivist Meetings:   Marland Kitchen Marital Status:     Review of Systems: A 12 point ROS discussed and pertinent positives are indicated in the HPI above.  All other systems are negative.  Review of Systems  Unable to perform ROS: Dementia    Vital Signs: BP (!) 133/57   Pulse 83   Temp 98.3 F (36.8 C)   Resp 18   Ht 5' (1.524 m) Comment: per pt state  Wt 142 lb 10.2 oz (64.7 kg)   SpO2 97%   BMI 27.86 kg/m   Physical Exam Constitutional:      General: She is not in acute distress. HENT:     Mouth/Throat:     Mouth: Mucous membranes are moist.     Pharynx: Oropharynx is clear.  Cardiovascular:     Rate and Rhythm: Normal rate and regular rhythm.     Pulses: Normal pulses.     Heart sounds: Normal heart sounds.  Pulmonary:     Effort: Pulmonary effort is normal.      Breath sounds: Normal breath sounds.  Abdominal:     General: Bowel sounds are normal.     Palpations: Abdomen is soft.  Skin:    General: Skin is warm and dry.  Neurological:     Mental Status: She is alert. She is disoriented.     Comments: Alert to self only.   Psychiatric:        Speech: Speech normal.  Behavior: Behavior is cooperative.     Imaging: DG Chest Port 1 View  Result Date: 08/08/2019 CLINICAL DATA:  Syncope. EXAM: PORTABLE CHEST 1 VIEW COMPARISON:  July 15, 2018. FINDINGS: The heart size and mediastinal contours are within normal limits. Both lungs are clear. No pneumothorax or pleural effusion is noted. The visualized skeletal structures are unremarkable. IMPRESSION: No active disease. Electronically Signed   By: Marijo Conception M.D.   On: 08/08/2019 13:55   VAS [place] LOWER EXTREMITY VENOUS (DVT)  Result Date: 08/09/2019  Lower Venous DVTStudy Indications: Edema.  Comparison Study: No prior study on file Performing Technologist: Sharion Dove RVS  Examination Guidelines: A complete evaluation includes B-mode imaging, spectral Doppler, color Doppler, and power Doppler as needed of all accessible portions of each vessel. Bilateral testing is considered an integral part of a complete examination. Limited examinations for reoccurring indications may be performed as noted. The reflux portion of the exam is performed with the patient in reverse Trendelenburg.  +---------+---------------+---------+-----------+----------+--------------+ RIGHT    CompressibilityPhasicitySpontaneityPropertiesThrombus Aging +---------+---------------+---------+-----------+----------+--------------+ CFV      Full           Yes      Yes                                 +---------+---------------+---------+-----------+----------+--------------+ SFJ      Full                                                         +---------+---------------+---------+-----------+----------+--------------+ FV Prox  None                                         Acute          +---------+---------------+---------+-----------+----------+--------------+ FV Mid   None                                         Acute          +---------+---------------+---------+-----------+----------+--------------+ FV DistalNone                                         Acute          +---------+---------------+---------+-----------+----------+--------------+ PFV      Full                                                        +---------+---------------+---------+-----------+----------+--------------+ POP      None           No       No                   Acute          +---------+---------------+---------+-----------+----------+--------------+ PTV      None  Acute          +---------+---------------+---------+-----------+----------+--------------+ PERO     None                                         Acute          +---------+---------------+---------+-----------+----------+--------------+   +---------+---------------+---------+-----------+----------+--------------+ LEFT     CompressibilityPhasicitySpontaneityPropertiesThrombus Aging +---------+---------------+---------+-----------+----------+--------------+ CFV      Full           Yes      Yes                                 +---------+---------------+---------+-----------+----------+--------------+ SFJ      Full                                                        +---------+---------------+---------+-----------+----------+--------------+ FV Prox  Full                                                        +---------+---------------+---------+-----------+----------+--------------+ FV Mid   Full                                                         +---------+---------------+---------+-----------+----------+--------------+ FV DistalFull                                                        +---------+---------------+---------+-----------+----------+--------------+ PFV      Full                                                        +---------+---------------+---------+-----------+----------+--------------+ POP      Full           Yes      Yes                                 +---------+---------------+---------+-----------+----------+--------------+ PTV      Full                                                        +---------+---------------+---------+-----------+----------+--------------+ PERO     Full                                                        +---------+---------------+---------+-----------+----------+--------------+  Summary: RIGHT: - Findings consistent with acute deep vein thrombosis involving the right femoral vein, right popliteal vein, right posterior tibial veins, and right peroneal veins.  LEFT: - There is no evidence of deep vein thrombosis in the lower extremity.  *See table(s) above for measurements and observations.    Preliminary     Labs:  CBC: Recent Labs    07/10/19 0642 07/11/19 0323 08/08/19 1341 08/10/19 0138  WBC 11.8* 9.2 13.8* 11.3*  HGB 12.0 10.4* 12.4 10.9*  HCT 37.4 32.4* 40.5 33.8*  PLT 142* 126* 237 228    COAGS: No results for input(s): INR, APTT in the last 8760 hours.  BMP: Recent Labs    07/11/19 0323 08/08/19 1341 08/09/19 0507 08/10/19 0138  NA 139 145 146* 146*  K 3.9 3.7 3.7 3.5  CL 109 110 111 110  CO2 21* 22 22 25   GLUCOSE 134* 148* 99 131*  BUN 33* 31* 29* 26*  CALCIUM 8.2* 8.9 8.3* 8.9  CREATININE 1.24* 1.47* 1.15* 0.92  GFRNONAA 40* 32* 43* 57*  GFRAA 46* 37* 50* >60    LIVER FUNCTION TESTS: Recent Labs    07/05/19 0923 07/10/19 0642 07/11/19 0323 08/08/19 1341  BILITOT 0.6 0.7 0.6 0.9  AST 19 28 24 25   ALT 10 11 11 14    ALKPHOS 23* 18* 18* 35*  PROT 7.0 5.3* 4.6* 6.4*  ALBUMIN 3.6 2.2* 1.9* 2.7*    TUMOR MARKERS: No results for input(s): AFPTM, CEA, CA199, CHROMGRNA in the last 8760 hours.  Assessment and Plan:  Deep vein thrombosis, right lower extremity: [name], 86 year old female, is scheduled for the placement of an inferior vena cava filter in Interventional Radiology today, 08/10/19.  Risks and benefits discussed with the patient's daughter including, but not limited to bleeding, infection, contrast induced renal failure, filter fracture or migration which can lead to emergency surgery or even death, strut penetration with damage or irritation to adjacent structures and caval thrombosis.  All of the patient's daughter's questions were answered, patient is agreeable to proceed.  Consent signed and in Interventional Radiology.   She has been NPO. Labs and vitals have been reviewed. She is receiving subcutaneous Lovenox 65 mg, last dose 08/09/19 at [facility].     Thank you for this interesting consult.  I greatly enjoyed meeting [name] and look forward to participating in their care.  A copy of this report was sent to the requesting provider on this date.  Electronically Signed: Soyla Dryer, AGACNP-BC [phone] 08/10/2019, 11:21 AM   I spent a total of 40 Minutes    in face to face in clinical consultation, greater than 50% of which was counseling/coordinating care for image-guided placement of inferior vena cava filter.

## 2019-08-10 NOTE — Progress Notes (Signed)
Progress Note  Patient Name: Kristina [name] Date of Encounter: 08/10/2019  [facility] Cardiologist: No primary care provider on file. New  Subjective   Resting. Was disoriented and agitated, repeatedly disconnecting monitor leads. Denies dyspnea. Poor PO intake. Mild hypernatremia. Slight improvement in BUN/creatinine. LE venous Doppler shows presence of Right femoropopliteal DVT. On full dose enoxaparin.  Inpatient Medications    Scheduled Meds: . enoxaparin (LOVENOX) injection  65 mg Subcutaneous Q24H  . feeding supplement (ENSURE ENLIVE)  237 mL Oral BID BM  . levothyroxine  75 mcg Oral QAC breakfast  . memantine  10 mg Oral BID  . multivitamin with minerals  1 tablet Oral Daily  . sodium chloride flush  3 mL Intravenous Q12H   Continuous Infusions: . sodium chloride 75 mL/hr at 08/09/19 1644  . albumin human 12.5 g (08/10/19 0652)   PRN Meds: acetaminophen **OR** acetaminophen, fluticasone   Vital Signs    Vitals:   08/09/19 0808 08/09/19 1632 08/09/19 2248 08/10/19 0743  BP: (!) 114/96 (!) 121/63 124/68 (!) 133/57  Pulse: 80 92 88 83  Resp: 13 14 18 18   Temp: (!) 97.5 F (36.4 C) 98 F (36.7 C) 98.1 F (36.7 C) 98.3 F (36.8 C)  TempSrc:   Axillary   SpO2: 98% 96% 98% 97%  Weight:      Height:        Intake/Output Summary (Last 24 hours) at 08/10/2019 0808 Last data filed at 08/10/2019 4132 Gross per 24 hour  Intake 1141.23 ml  Output 550 ml  Net 591.23 ml   Last 3 Weights 08/09/2019 07/12/2019 07/11/2019  Weight (lbs) 142 lb 10.2 oz 157 lb 10.1 oz 155 lb 3.3 oz  Weight (kg) 64.7 kg 71.5 kg 70.4 kg      Telemetry    N/a - removed by patient - Personally Reviewed  ECG    No new tracing - Personally Reviewed  Physical Exam  Sleepy GEN: No acute distress.   Neck: No JVD Cardiac: RRR, no murmurs, rubs, or gallops.  Respiratory: Clear to auscultation bilaterally. GI: Soft, nontender, non-distended  MS: mild edema R LE; No  deformity. Neuro:  Nonfocal  Psych: Normal affect   Labs    High Sensitivity Troponin:   Recent Labs  Lab 08/08/19 1341 08/08/19 1622 08/09/19 0507  TROPONINIHS 182* 654* 455*      Chemistry Recent Labs  Lab 08/08/19 1341 08/09/19 0507 08/10/19 0138  NA 145 146* 146*  K 3.7 3.7 3.5  CL 110 111 110  CO2 22 22 25   GLUCOSE 148* 99 131*  BUN 31* 29* 26*  CREATININE 1.47* 1.15* 0.92  CALCIUM 8.9 8.3* 8.9  PROT 6.4*  --   --   ALBUMIN 2.7*  --   --   AST 25  --   --   ALT 14  --   --   ALKPHOS 35*  --   --   BILITOT 0.9  --   --   GFRNONAA 32* 43* 57*  GFRAA 37* 50* >60  ANIONGAP 13 13 11      Hematology Recent Labs  Lab 08/08/19 1341 08/10/19 0138  WBC 13.8* 11.3*  RBC 3.93 3.40*  HGB 12.4 10.9*  HCT 40.5 33.8*  MCV 103.1* 99.4  MCH 31.6 32.1  MCHC 30.6 32.2  RDW 14.7 14.5  PLT 237 228    BNP Recent Labs  Lab 08/08/19 1728  BNP 53.3     DDimer No results for input(s): DDIMER  in the last 168 hours.   Radiology    DG Chest Port 1 View  Result Date: 08/08/2019 CLINICAL DATA:  Syncope. EXAM: PORTABLE CHEST 1 VIEW COMPARISON:  July 15, 2018. FINDINGS: The heart size and mediastinal contours are within normal limits. Both lungs are clear. No pneumothorax or pleural effusion is noted. The visualized skeletal structures are unremarkable. IMPRESSION: No active disease. Electronically Signed   By: Marijo Conception M.D.   On: 08/08/2019 13:55   VAS [place] LOWER EXTREMITY VENOUS (DVT)  Result Date: 08/09/2019  Lower Venous DVTStudy Indications: Edema.  Comparison Study: No prior study on file Performing Technologist: Sharion Dove RVS  Examination Guidelines: A complete evaluation includes B-mode imaging, spectral Doppler, color Doppler, and power Doppler as needed of all accessible portions of each vessel. Bilateral testing is considered an integral part of a complete examination. Limited examinations for reoccurring indications may be performed as noted. The reflux  portion of the exam is performed with the patient in reverse Trendelenburg.  +---------+---------------+---------+-----------+----------+--------------+ RIGHT    CompressibilityPhasicitySpontaneityPropertiesThrombus Aging +---------+---------------+---------+-----------+----------+--------------+ CFV      Full           Yes      Yes                                 +---------+---------------+---------+-----------+----------+--------------+ SFJ      Full                                                        +---------+---------------+---------+-----------+----------+--------------+ FV Prox  None                                         Acute          +---------+---------------+---------+-----------+----------+--------------+ FV Mid   None                                         Acute          +---------+---------------+---------+-----------+----------+--------------+ FV DistalNone                                         Acute          +---------+---------------+---------+-----------+----------+--------------+ PFV      Full                                                        +---------+---------------+---------+-----------+----------+--------------+ POP      None           No       No                   Acute          +---------+---------------+---------+-----------+----------+--------------+ PTV      None  Acute          +---------+---------------+---------+-----------+----------+--------------+ PERO     None                                         Acute          +---------+---------------+---------+-----------+----------+--------------+   +---------+---------------+---------+-----------+----------+--------------+ LEFT     CompressibilityPhasicitySpontaneityPropertiesThrombus Aging +---------+---------------+---------+-----------+----------+--------------+ CFV      Full           Yes      Yes                                  +---------+---------------+---------+-----------+----------+--------------+ SFJ      Full                                                        +---------+---------------+---------+-----------+----------+--------------+ FV Prox  Full                                                        +---------+---------------+---------+-----------+----------+--------------+ FV Mid   Full                                                        +---------+---------------+---------+-----------+----------+--------------+ FV DistalFull                                                        +---------+---------------+---------+-----------+----------+--------------+ PFV      Full                                                        +---------+---------------+---------+-----------+----------+--------------+ POP      Full           Yes      Yes                                 +---------+---------------+---------+-----------+----------+--------------+ PTV      Full                                                        +---------+---------------+---------+-----------+----------+--------------+ PERO     Full                                                        +---------+---------------+---------+-----------+----------+--------------+  Summary: RIGHT: - Findings consistent with acute deep vein thrombosis involving the right femoral vein, right popliteal vein, right posterior tibial veins, and right peroneal veins.  LEFT: - There is no evidence of deep vein thrombosis in the lower extremity.  *See table(s) above for measurements and observations.    Preliminary     Cardiac Studies   Echocardiogram 07/10/2019  1. Left ventricular ejection fraction, by estimation, is 60 to 65%. The  left ventricle has normal function. The left ventricle has no regional  wall motion abnormalities. There is mild concentric left ventricular  hypertrophy. Left  ventricular diastolic  parameters are consistent with Grade I diastolic dysfunction (impaired  relaxation).  2. Right ventricular systolic function is normal. The right ventricular  size is normal.  3. The mitral valve is normal in structure. No evidence of mitral valve  regurgitation. No evidence of mitral stenosis.  4. The aortic valve is normal in structure. Aortic valve regurgitation is  not visualized. No aortic stenosis is present.  5. The inferior vena cava is normal in size with greater than 50%  respiratory variability, suggesting right atrial pressure of 3 mmHg.   Patient Profile     86 y.o. female with a hx of hypertension, hypercholesterolemia, hypothyroidism on hormone replacement therapy, CKD stage III, advanced dementia, recurrent syncope due to orthostatic hypotension, newly diagnosed R fem-pop DVT  Assessment & Plan    1.  Syncope/orthostatic hypotension: prefer conservative measures (compression stockings, liberalized salt intake, aggressive rehydration), but can add midodrine 2.5 mg BID or TID prn, ensuring she does not lie fully supine for 4 hours after dose of medication. Recurrent problems with hypernatremia suggest she has impaired thirst response, maybe related to her cognitive decline. 2. DVT: retrospectively, this raises the possibility that her more recent and more severe syncope events could have been related to pulmonary embolism (possible cause of elevated BNP and troponin), superimposed on orthostatic hypotension. Treatment would not be different. On enoxaparin at this time. 3. Dementia: palliative care consultation is appropriate. [facility] will sign off.   Medication Recommendations:  Midodrine 2.5 mg 2-3 times daily as needed. Other recommendations (labs, testing, etc):  n/a Follow up as an outpatient:  prn  For questions or updates, please contact [place] Please consult [email or web address] for contact info under        Signed, Sanda Klein, MD  08/10/2019, 8:08 AM

## 2019-08-11 LAB — GLUCOSE, CAPILLARY: Glucose-Capillary: 103 mg/dL — ABNORMAL HIGH (ref 70–99)

## 2019-08-11 NOTE — Progress Notes (Signed)
PROGRESS NOTE  Kristina [name]  DOB: 11/15/1931  PCP: Midge Minium, MD NOM:[id number]  DOA: 08/08/2019  LOS: 2 days   Chief Complaint  Patient presents with   Loss of Consciousness   Brief narrative: Kristina [name] is a 86 y.o. female with PMH significant for advanced dementia, hypertension, hyperlipidemia, hypothyroidism, CKD stage III who presented to ED on 7/28 with near syncopeepisodes.  Patient is advanced baseline dementia, most of the history provided by patient's daughter at the bedside.  Patient was admitted about 4 weeks ago for similar complaints. During that admission, she was noted to have orthostatic hypotension, midodrine was started.  However daughter states patient never took midodrine.  Patient has poor oral intake and requires frequent reminders and supervision.  Daughter has been quite tied up with her job recently,and unable to supervise her as before.  On the day of admission, patient complained of lightheadedness, wobbly gait and had 1 episode of syncope.  Daughter was able to hold her down and hence the patient did not have significant injuries.   EMS found patient to be tachycardia and hypotensive.  The ED, patient was afebrile, heart rate 109, blood pressure 98/61.  Both improved after IV hydration.   Labs showed creatinine 1.43 which is at baseline. Troponin elevated to 182>>654>>455 Lactic acid 1.4 WBC 13.8 Ultrasound duplex of lower extremities showed DVT in the right femoral, right popliteal and right peroneal vein.  Subjective: Patient was seen and examined this morning.   Lying down in bed.  Not in distress.  Slow to respond.  Not in pain.   No labs this morning  Assessment/Plan: Recurrent syncope -Secondary to orthostatic hypotension, poor oral intake. -No evidence of infection. -Cardiology consultation was obtained. -Patient received aggressive hydration.  She also received IV albumin. -We will allow her systolic blood pressure to  drift to 140s to 160s. -Advised for compression stockings and abdominal binder -Per cardiology, If these conservative interventions do not provide relief of symptoms, midodrine can be considered, but it is important to impress upon the patient that she cannot lie down for 4 hours after taking that medication.  Acute DVT -Of right femoral, right popliteal and right peroneal vein. -Discussed with patient and her daughter.  Patient is not a good candidate for long-term anticoagulation because of frequent syncope and falls. -7/30, patient underwent a successful IVC filter placement by IR.  Per IR, because of her comorbidities, it remains as a permanent filter and want be retrieved.  Elevated troponin -Secondary to prolonged hypotension -Troponin peaked at 654, downtrending now. -No chest pain or shortness of breath.   -Not a candidate for revascularization status.  Generalized weakness -PT evaluation obtained.  SNF recommended.  Apparently, patient was recently at an SNF for 4 weeks and was discharged to home.  She was at home for only about a week and ended up coming back to the hospital.  AKI on CKD 3a-3b -GFR baseline 32-57, presented with a creatinine of 1.47, improved to 0.92 on last blood work on 7/30.  Repeat blood work Architectural technologist.  Leukocytosis -No evidence of infection, lactic acid normal.  WBC count trending down.  Mobility: PT eval Code Status:   Code Status: DNR  Nutritional status: Body mass index is 28.46 kg/m. Nutrition Problem: Increased nutrient needs Etiology: wound healing Signs/Symptoms: estimated needs Diet Order            Diet Heart Room service appropriate? No; Fluid consistency: Thin  Diet effective now               .  DVT prophylaxis:    Antimicrobials:  None Fluid: Okay to stop IV fluid today. Consultants: Cardiology Family Communication:  None at bedside  Status is: Inpatient  Remains inpatient appropriate because:Unsafe d/c plan   Dispo: The  patient is from: Home              Anticipated d/c is to: SNF              Anticipated d/c date is: 2 days              Patient currently is medically stable to d/c.   Infusions:    Scheduled Meds:  enoxaparin (LOVENOX) injection  65 mg Subcutaneous Q24H   feeding supplement (ENSURE ENLIVE)  237 mL Oral BID BM   levothyroxine  75 mcg Oral QAC breakfast   memantine  10 mg Oral BID   multivitamin with minerals  1 tablet Oral Daily   sodium chloride flush  3 mL Intravenous Q12H    Antimicrobials: Anti-infectives (From admission, onward)   None      PRN meds: acetaminophen **OR** acetaminophen, fluticasone   Objective: Vitals:   08/10/19 1645 08/10/19 2101  BP: 100/70 93/78  Pulse: 85 96  Resp: 18 17  Temp: 97.8 F (36.6 C) 98.2 F (36.8 C)  SpO2: 97% 97%    Intake/Output Summary (Last 24 hours) at 08/11/2019 1053 Last data filed at 08/11/2019 0459 Gross per 24 hour  Intake 531.71 ml  Output 650 ml  Net -118.29 ml   Filed Weights   08/09/19 0000 08/11/19 0458  Weight: 64.7 kg 66.1 kg   Weight change:  Body mass index is 28.46 kg/m.   Physical Exam: General exam: Appears calm and comfortable.  Not in physical distress Skin: No rashes, lesions or ulcers. HEENT: Atraumatic, normocephalic, supple neck, no obvious bleeding Lungs: Clear to auscultation bilaterally.  No crackles or wheezing. CVS: Regular rate and rhythm, no murmur GI/Abd soft, nontender, nondistended, bowel sound present CNS: Alert, awake, oriented to place.  Slow to respond. Psychiatry: Depressed look Extremities: Right lower extremity trace pedal edema, left normal, no calf tenderness.  Data Review: I have personally reviewed the laboratory data and studies available.  Recent Labs  Lab 08/08/19 1341 08/10/19 0138  WBC 13.8* 11.3*  NEUTROABS 11.4*  --   HGB 12.4 10.9*  HCT 40.5 33.8*  MCV 103.1* 99.4  PLT 237 228   Recent Labs  Lab 08/08/19 1341 08/09/19 0507 08/10/19 0138    NA 145 146* 146*  K 3.7 3.7 3.5  CL 110 111 110  CO2 22 22 25   GLUCOSE 148* 99 131*  BUN 31* 29* 26*  CREATININE 1.47* 1.15* 0.92  CALCIUM 8.9 8.3* 8.9  MG 1.9  --   --    Lab Results  Component Value Date   HGBA1C 6.5 08/06/2016       Component Value Date/Time   CHOL 128 07/05/2019 0923   TRIG 136.0 07/05/2019 0923   HDL 34.10 (L) 07/05/2019 0923   CHOLHDL 4 07/05/2019 0923   VLDL 27.2 07/05/2019 0923   LDLCALC 67 07/05/2019 0923   LDLCALC 72 03/14/2018 0938   LDLDIRECT 115.0 08/06/2016 1034   Signed, Terrilee Croak, MD [facility] Pager: [phone] (Secure Chat preferred). 08/11/2019

## 2019-08-11 NOTE — TOC Progression Note (Signed)
Transition of Care [facility]) - Progression Note    Patient Details  Name: GEORGA STYS MRN: [id number] Date of Birth: January 25, 1931  Transition of Care [facility]) CM/SW [place], [place] Phone Number: 08/11/2019, 5:38 PM  Clinical Narrative:    CSW spoke with patient's daughter [name] to discuss discharge plan. [name] expressed the family would like to move forward with long-term SNF placement for patient, private pay. [name] requested CSW reach out to [facility] [facility] and [employer] [employer] for placement. CSW agreed to reach out and follow-up.  Expected Discharge Plan: [place] Barriers to Discharge: Continued Medical Work up  Expected Discharge Plan and Services Expected Discharge Plan: [place] arrangements for the past 2 months: [place], [place]                                       Social Determinants of Health (SDOH) Interventions    Readmission Risk Interventions Readmission Risk Prevention Plan 07/10/2019  Post Dischage Appt Not Complete  Appt Comments plan for SNF  Medication Screening Complete  Transportation Screening Complete  PCP or Specialist Appt within 5-7 Days Not Complete  Not Complete comments plan for SNF  Home Care Screening Complete  Medication Review (RN CM) Referral to Pharmacy  Some recent data might be hidden

## 2019-08-12 LAB — CBC WITH DIFFERENTIAL/PLATELET
Abs Immature Granulocytes: 0.04 10*3/uL (ref 0.00–0.07)
Basophils Absolute: 0.1 10*3/uL (ref 0.0–0.1)
Basophils Relative: 1 %
Eosinophils Absolute: 0.2 10*3/uL (ref 0.0–0.5)
Eosinophils Relative: 2 %
HCT: 35 % — ABNORMAL LOW (ref 36.0–46.0)
Hemoglobin: 11.4 g/dL — ABNORMAL LOW (ref 12.0–15.0)
Immature Granulocytes: 0 %
Lymphocytes Relative: 26 %
Lymphs Abs: 2.6 10*3/uL (ref 0.7–4.0)
MCH: 31.5 pg (ref 26.0–34.0)
MCHC: 32.6 g/dL (ref 30.0–36.0)
MCV: 96.7 fL (ref 80.0–100.0)
Monocytes Absolute: 0.7 10*3/uL (ref 0.1–1.0)
Monocytes Relative: 7 %
Neutro Abs: 6.3 10*3/uL (ref 1.7–7.7)
Neutrophils Relative %: 64 %
Platelets: 229 10*3/uL (ref 150–400)
RBC: 3.62 MIL/uL — ABNORMAL LOW (ref 3.87–5.11)
RDW: 14.2 % (ref 11.5–15.5)
WBC: 9.8 10*3/uL (ref 4.0–10.5)
nRBC: 0 % (ref 0.0–0.2)

## 2019-08-12 LAB — BASIC METABOLIC PANEL
Anion gap: 8 (ref 5–15)
BUN: 17 mg/dL (ref 8–23)
CO2: 22 mmol/L (ref 22–32)
Calcium: 8.1 mg/dL — ABNORMAL LOW (ref 8.9–10.3)
Chloride: 108 mmol/L (ref 98–111)
Creatinine, Ser: 0.84 mg/dL (ref 0.44–1.00)
GFR calc Af Amer: >60 mL/min (ref >60)
GFR calc non Af Amer: >60 mL/min (ref >60)
Glucose, Bld: 100 mg/dL — ABNORMAL HIGH (ref 70–99)
Potassium: 4.1 mmol/L (ref 3.5–5.1)
Sodium: 138 mmol/L (ref 135–145)

## 2019-08-12 LAB — GLUCOSE, CAPILLARY: Glucose-Capillary: 86 mg/dL (ref 70–99)

## 2019-08-12 NOTE — Plan of Care (Signed)
  Problem: Education: Goal: Knowledge of General Education information will improve Description Including pain rating scale, medication(s)/side effects and non-pharmacologic comfort measures Outcome: Progressing   

## 2019-08-12 NOTE — Progress Notes (Addendum)
PROGRESS NOTE  Kristina [name]  DOB: 02/25/1931  PCP: Midge Minium, MD NOM:[id number]  DOA: 08/08/2019  LOS: 3 days   Chief Complaint  Patient presents with  . Loss of Consciousness   Brief narrative: Kristina [name] is a 86 y.o. female with PMH significant for advanced dementia, hypertension, hyperlipidemia, hypothyroidism, CKD stage III who presented to ED on 7/28 with near syncopeepisodes.  Patient is advanced baseline dementia, most of the history provided by patient's daughter at the bedside.  Patient was admitted about 4 weeks ago for similar complaints. During that admission, she was noted to have orthostatic hypotension, midodrine was started.  However daughter states patient never took midodrine.  Patient has poor oral intake and requires frequent reminders and supervision.  Daughter has been quite tied up with her job recently,and unable to supervise her as before.  On the day of admission, patient complained of lightheadedness, wobbly gait and had 1 episode of syncope.  Daughter was able to hold her down and hence the patient did not have significant injuries.   EMS found patient to be tachycardia and hypotensive.  The ED, patient was afebrile, heart rate 109, blood pressure 98/61.  Both improved after IV hydration.   Labs showed creatinine 1.43 which is at baseline. Troponin elevated to 182>>654>>455 Lactic acid 1.4 WBC 13.8 Ultrasound duplex of lower extremities showed DVT in the right femoral, right popliteal and right peroneal vein.  Subjective: Patient was seen and examined this morning.   Propped up in bed.  Not in distress.  Taking her breakfast.  Slow to answer orientation questions.  Oriented to place only.  Assessment/Plan: Recurrent syncope -Secondary to orthostatic hypotension, poor oral intake. -No evidence of infection. -Cardiology consultation was obtained. -Patient received aggressive hydration and IV albumin. -We will allow her systolic  blood pressure to drift to 140s to 160s. -Advised for compression stockings and abdominal binder -Per cardiology, If these conservative interventions do not provide relief of symptoms, midodrine can be considered, but it is important to impress upon the patient that she cannot lie down for 4 hours after taking that medication.  Acute DVT -Presented with right lower extremity swelling.  Noted to have acute DVT of right femoral, right popliteal and right peroneal vein. -Discussed with patient and her daughter.  Patient is not a good candidate for long-term anticoagulation because of frequent syncope and falls. -7/30, patient underwent a successful IVC filter placement by IR.  Per IR, because of her comorbidities, it remains as a permanent filter and want be retrieved.  Elevated troponin -Secondary to prolonged hypotension -Troponin peaked at 654, down trended. -No chest pain or shortness of breath.   -Not a candidate for revascularization.  Generalized weakness -PT evaluation obtained.  SNF recommended.  Apparently, patient was recently at an SNF for 4 weeks and was discharged to home.  She was at home for only about a week and ended up coming back to the hospital.  AKI on CKD 3a-3b -GFR baseline 32-57, presented with a creatinine of 1.47, improved to 0.84 today.  Leukocytosis -No evidence of infection, lactic acid normal.  WBC count trending down.  Mobility: PT eval Code Status:   Code Status: DNR  Nutritional status: Body mass index is 28.93 kg/m. Nutrition Problem: Increased nutrient needs Etiology: wound healing Signs/Symptoms: estimated needs Diet Order            Diet Heart Room service appropriate? No; Fluid consistency: Thin  Diet effective now  DVT prophylaxis:   Antimicrobials:  None Fluid: not on IV fluid. Consultants: Cardiology Family Communication:  None at bedside  Status is: Inpatient  Remains inpatient appropriate because:Unsafe d/c  plan   Dispo: The patient is from: Home              Anticipated d/c is to: SNF              Anticipated d/c date is: whenever bed/authorization is available.              Patient currently is medically stable to d/c.  Pressure Injury 07/09/19 Buttocks Left;Right;Medial Stage 2 -  Partial thickness loss of dermis presenting as a shallow open injury with a red, pink wound bed without slough. (Active)  07/09/19 2326  Location: Buttocks  Location Orientation: Left;Right;Medial  Staging: Stage 2 -  Partial thickness loss of dermis presenting as a shallow open injury with a red, pink wound bed without slough.  Wound Description (Comments):   Present on Admission: Yes     Pressure Injury 07/09/19 Buttocks Left Stage 2 -  Partial thickness loss of dermis presenting as a shallow open injury with a red, pink wound bed without slough. (Active)  07/09/19 2313  Location: Buttocks  Location Orientation: Left  Staging: Stage 2 -  Partial thickness loss of dermis presenting as a shallow open injury with a red, pink wound bed without slough.  Wound Description (Comments):   Present on Admission: Yes     Pressure Injury 07/09/19 Sacrum (Active)  07/09/19 2313  Location: Sacrum  Location Orientation:   Staging:   Wound Description (Comments):   Present on Admission:     Infusions:    Scheduled Meds: . enoxaparin (LOVENOX) injection  65 mg Subcutaneous Q24H  . feeding supplement (ENSURE ENLIVE)  237 mL Oral BID BM  . levothyroxine  75 mcg Oral QAC breakfast  . memantine  10 mg Oral BID  . multivitamin with minerals  1 tablet Oral Daily  . sodium chloride flush  3 mL Intravenous Q12H    Antimicrobials: Anti-infectives (From admission, onward)   None      PRN meds: acetaminophen **OR** acetaminophen, fluticasone   Objective: Vitals:   08/11/19 2322 08/12/19 0819  BP: (!) 139/55 (!) 130/67  Pulse: 79 75  Resp: 19 16  Temp: 98.6 F (37 C) 97.7 F (36.5 C)  SpO2: 99% 99%     Intake/Output Summary (Last 24 hours) at 08/12/2019 1041 Last data filed at 08/12/2019 0546 Gross per 24 hour  Intake 183 ml  Output 725 ml  Net -542 ml   Filed Weights   08/09/19 0000 08/11/19 0458 08/12/19 0500  Weight: 64.7 kg 66.1 kg 67.2 kg   Weight change: 1.1 kg Body mass index is 28.93 kg/m.   Physical Exam: General exam: Appears calm and comfortable.  Not in physical distress Skin: No rashes, lesions or ulcers. HEENT: Atraumatic, normocephalic, supple neck, no obvious bleeding Lungs: clear to auscultation b/l. CVS: Regular rate and rhythm, no murmur GI/Abd soft, nontender, nondistended, bowel sound present CNS: Alert, awake, oriented to place.  Slow to respond. Psychiatry: Depressed look Extremities: Right lower extremity trace pedal edema, left normal, no calf tenderness.  Data Review: I have personally reviewed the laboratory data and studies available.  Recent Labs  Lab 08/08/19 1341 08/10/19 0138 08/12/19 0157  WBC 13.8* 11.3* 9.8  NEUTROABS 11.4*  --  6.3  HGB 12.4 10.9* 11.4*  HCT 40.5 33.8* 35.0*  MCV 103.1* 99.4  96.7  PLT 237 228 229   Recent Labs  Lab 08/08/19 1341 08/09/19 0507 08/10/19 0138 08/12/19 0157  NA 145 146* 146* 138  K 3.7 3.7 3.5 4.1  CL 110 111 110 108  CO2 22 22 25 22   GLUCOSE 148* 99 131* 100*  BUN 31* 29* 26* 17  CREATININE 1.47* 1.15* 0.92 0.84  CALCIUM 8.9 8.3* 8.9 8.1*  MG 1.9  --   --   --    Lab Results  Component Value Date   HGBA1C 6.5 08/06/2016       Component Value Date/Time   CHOL 128 07/05/2019 0923   TRIG 136.0 07/05/2019 0923   HDL 34.10 (L) 07/05/2019 0923   CHOLHDL 4 07/05/2019 0923   VLDL 27.2 07/05/2019 0923   LDLCALC 67 07/05/2019 0923   LDLCALC 72 03/14/2018 0938   LDLDIRECT 115.0 08/06/2016 1034   Signed, Terrilee Croak, MD [facility] Pager: [phone] (Secure Chat preferred). 08/12/2019

## 2019-08-13 LAB — CBC
HCT: 33.9 % — ABNORMAL LOW (ref 36.0–46.0)
Hemoglobin: 11 g/dL — ABNORMAL LOW (ref 12.0–15.0)
MCH: 32.2 pg (ref 26.0–34.0)
MCHC: 32.4 g/dL (ref 30.0–36.0)
MCV: 99.1 fL (ref 80.0–100.0)
Platelets: 215 10*3/uL (ref 150–400)
RBC: 3.42 MIL/uL — ABNORMAL LOW (ref 3.87–5.11)
RDW: 14.4 % (ref 11.5–15.5)
WBC: 9.4 10*3/uL (ref 4.0–10.5)
nRBC: 0.3 % — ABNORMAL HIGH (ref 0.0–0.2)

## 2019-08-13 LAB — GLUCOSE, CAPILLARY
Glucose-Capillary: 77 mg/dL (ref 70–99)
Glucose-Capillary: 101 mg/dL — ABNORMAL HIGH (ref 70–99)

## 2019-08-13 MED ORDER — HALOPERIDOL LACTATE 5 MG/ML IJ SOLN
1.0000 mg | Freq: Once | INTRAMUSCULAR | Status: AC
  Administered 2019-08-13: 1 mg via INTRAVENOUS
  Filled 2019-08-13: qty 1

## 2019-08-13 MED ORDER — HALOPERIDOL LACTATE 5 MG/ML IJ SOLN: 1.0000 mg | Freq: Four times a day (QID) | INTRAMUSCULAR | Status: DC | PRN

## 2019-08-13 NOTE — Plan of Care (Signed)

## 2019-08-13 NOTE — Progress Notes (Signed)
[facility] paged that patient thinks she needs to go home to be with her daughter

## 2019-08-13 NOTE — Progress Notes (Signed)
PROGRESS NOTE  Kristina [name]  DOB: 08-10-1931  PCP: Kristina Minium, MD AYT:[id number]  DOA: 08/08/2019  LOS: 4 days   Chief Complaint  Patient presents with  . Loss of Consciousness   Brief narrative: Kristina [name] is a 86 y.o. female with PMH significant for advanced dementia, hypertension, hyperlipidemia, hypothyroidism, CKD stage III who presented to ED on 7/28 with near syncopeepisodes.  Patient is advanced baseline dementia, most of the history provided by patient's daughter at the bedside.  Patient was admitted about 4 weeks ago for similar complaints. During that admission, she was noted to have orthostatic hypotension, midodrine was started.  However daughter states patient never took midodrine.  Patient has poor oral intake and requires frequent reminders and supervision.  Daughter has been quite tied up with her job recently,and unable to supervise her as before.  On the day of admission, patient complained of lightheadedness, wobbly gait and had 1 episode of syncope.  Daughter was able to hold her down and hence the patient did not have significant injuries.   EMS found patient to be tachycardia and hypotensive.  The ED, patient was afebrile, heart rate 109, blood pressure 98/61.  Both improved after IV hydration.   Labs showed creatinine 1.43 which is at baseline. Troponin elevated to 182>>654>>455 Lactic acid 1.4 WBC 13.8 Ultrasound duplex of lower extremities showed DVT in the right femoral, right popliteal and right peroneal vein.  Subjective: Patient was seen and examined this morning.   Lying on bed.  Not in distress. No new symptoms.  Pending SNF placement.  Assessment/Plan: Recurrent syncope -Secondary to orthostatic hypotension, poor oral intake. -No evidence of infection. -Cardiology consultation was obtained. -Patient received aggressive hydration and IV albumin. -We will allow her systolic blood pressure to drift to 140s to 160s. -Advised  for compression stockings and abdominal binder -Per cardiology, If these conservative interventions do not provide relief of symptoms, midodrine can be considered, but it is important to impress upon the patient that she cannot lie down for 4 hours after taking that medication.  Acute DVT -Presented with right lower extremity swelling.  Noted to have acute DVT of right femoral, right popliteal and right peroneal vein. -Discussed with patient and her daughter.  Patient is not a good candidate for long-term anticoagulation because of frequent syncope and falls. -7/30, patient underwent a successful IVC filter placement by IR.  Per IR, because of her comorbidities, it remains as a permanent filter and want be retrieved.  Elevated troponin -Secondary to prolonged hypotension -Troponin peaked at 654, down trended. -No chest pain or shortness of breath.   -Not a candidate for revascularization.  Generalized weakness -PT evaluation obtained.  SNF recommended.  Apparently, patient was recently at an SNF for 4 weeks and was discharged to home.  She was at home for only about a week and ended up coming back to the hospital.  AKI on CKD 3a-3b -GFR baseline 32-57, presented with a creatinine of 1.47, improved to 0.84 on last blood work on 8/1.[facility] Kitchen  Leukocytosis -No evidence of infection, lactic acid normal.  WBC count trending down.  Mobility: PT eval Code Status:   Code Status: DNR  Nutritional status: Body mass index is 28.93 kg/m. Nutrition Problem: Increased nutrient needs Etiology: wound healing Signs/Symptoms: estimated needs Diet Order            Diet Heart Room service appropriate? No; Fluid consistency: Thin  Diet effective now  DVT prophylaxis:   Antimicrobials:  None Fluid: not on IV fluid. Consultants: Cardiology Family Communication:  None at bedside  Status is: Inpatient  Remains inpatient appropriate because pending SNF placement   Dispo: The patient  is from: Home              Anticipated d/c is to: SNF              Anticipated d/c date is: whenever bed/authorization is available.              Patient currently is medically stable to d/c.  Pressure Injury 07/09/19 Buttocks Left;Right;Medial Stage 2 -  Partial thickness loss of dermis presenting as a shallow open injury with a red, pink wound bed without slough. (Active)  07/09/19 2326  Location: Buttocks  Location Orientation: Left;Right;Medial  Staging: Stage 2 -  Partial thickness loss of dermis presenting as a shallow open injury with a red, pink wound bed without slough.  Wound Description (Comments):   Present on Admission: Yes     Pressure Injury 07/09/19 Buttocks Left Stage 2 -  Partial thickness loss of dermis presenting as a shallow open injury with a red, pink wound bed without slough. (Active)  07/09/19 2313  Location: Buttocks  Location Orientation: Left  Staging: Stage 2 -  Partial thickness loss of dermis presenting as a shallow open injury with a red, pink wound bed without slough.  Wound Description (Comments):   Present on Admission: Yes     Pressure Injury 07/09/19 Sacrum (Active)  07/09/19 2313  Location: Sacrum  Location Orientation:   Staging:   Wound Description (Comments):   Present on Admission:     Infusions:    Scheduled Meds: . enoxaparin (LOVENOX) injection  65 mg Subcutaneous Q24H  . feeding supplement (ENSURE ENLIVE)  237 mL Oral BID BM  . levothyroxine  75 mcg Oral QAC breakfast  . memantine  10 mg Oral BID  . multivitamin with minerals  1 tablet Oral Daily  . sodium chloride flush  3 mL Intravenous Q12H    Antimicrobials: Anti-infectives (From admission, onward)   None      PRN meds: acetaminophen **OR** acetaminophen, fluticasone   Objective: Vitals:   08/13/19 0559 08/13/19 0735  BP: (!) 154/64 (!) 138/76  Pulse: 71 72  Resp: 18 14  Temp: 98 F (36.7 C) 97.9 F (36.6 C)  SpO2: 98% 100%    Intake/Output Summary (Last  24 hours) at 08/13/2019 1316 Last data filed at 08/12/2019 2300 Gross per 24 hour  Intake --  Output 100 ml  Net -100 ml   Filed Weights   08/09/19 0000 08/11/19 0458 08/12/19 0500  Weight: 64.7 kg 66.1 kg 67.2 kg   Weight change:  Body mass index is 28.93 kg/m.   Physical Exam: General exam: Appears calm and comfortable.  Not in physical distress Skin: No rashes, lesions or ulcers. HEENT: Atraumatic, normocephalic, supple neck, no obvious bleeding Lungs: Clear to auscultation bilaterally CVS: Regular rate and rhythm, no murmur GI/Abd soft, nontender, nondistended, bowel sound present CNS: Alert, awake, oriented to place.  Slow to respond. Psychiatry: Depressed look Extremities: Right lower extremity trace pedal edema, left normal, no calf tenderness.  Data Review: I have personally reviewed the laboratory data and studies available.  Recent Labs  Lab 08/08/19 1341 08/10/19 0138 08/12/19 0157 08/13/19 0119  WBC 13.8* 11.3* 9.8 9.4  NEUTROABS 11.4*  --  6.3  --   HGB 12.4 10.9* 11.4* 11.0*  HCT 40.5  33.8* 35.0* 33.9*  MCV 103.1* 99.4 96.7 99.1  PLT 237 228 229 215   Recent Labs  Lab 08/08/19 1341 08/09/19 0507 08/10/19 0138 08/12/19 0157  NA 145 146* 146* 138  K 3.7 3.7 3.5 4.1  CL 110 111 110 108  CO2 22 22 25 22   GLUCOSE 148* 99 131* 100*  BUN 31* 29* 26* 17  CREATININE 1.47* 1.15* 0.92 0.84  CALCIUM 8.9 8.3* 8.9 8.1*  MG 1.9  --   --   --    Lab Results  Component Value Date   HGBA1C 6.5 08/06/2016       Component Value Date/Time   CHOL 128 07/05/2019 0923   TRIG 136.0 07/05/2019 0923   HDL 34.10 (L) 07/05/2019 0923   CHOLHDL 4 07/05/2019 0923   VLDL 27.2 07/05/2019 0923   LDLCALC 67 07/05/2019 0923   LDLCALC 72 03/14/2018 0938   LDLDIRECT 115.0 08/06/2016 1034   Signed, Terrilee Croak, MD [facility] Pager: [phone] (Secure Chat preferred). 08/13/2019

## 2019-08-14 LAB — GLUCOSE, CAPILLARY
Glucose-Capillary: 95 mg/dL (ref 70–99)
Glucose-Capillary: 95 mg/dL (ref 70–99)

## 2019-08-14 MED ORDER — HALOPERIDOL LACTATE 5 MG/ML IJ SOLN
1.0000 mg | Freq: Once | INTRAMUSCULAR | Status: AC
  Administered 2019-08-14: 1 mg via INTRAVENOUS
  Filled 2019-08-14: qty 1

## 2019-08-14 MED ORDER — ENSURE ENLIVE PO LIQD
237.0000 mL | Freq: Three times a day (TID) | ORAL | Status: DC
  Administered 2019-08-14 – 2019-08-17 (×7): 237 mL via ORAL

## 2019-08-14 NOTE — NC FL2 (Signed)
[place] LEVEL OF CARE SCREENING TOOL     IDENTIFICATION  Patient Name: Kristina [name] Birthdate: [birth year] Sex: female Admission Date (Current Location): 08/08/2019  [facility] and [place] Number:  Herbalist and Address:  [facility] [place]. [facility], [place] [address] [place], [place] [address]      Provider Number: [id number]  Attending Physician Name and Address:  Terrilee Croak, MD  Relative Name and Phone Number:  Iva Boop - daughter; [phone]    Current Level of Care: Hospital Recommended Level of Care: [place] (Family requesting LTC placement) Prior Approval Number:    Date Approved/Denied:   PASRR Number: [id number]  Discharge Plan: SNF (LTC)    Current Diagnoses: Patient Active Problem List   Diagnosis Date Noted  . Syncope 08/08/2019  . Elevated troponin   . Pressure injury of skin 07/10/2019  . Orthostatic hypotension 07/09/2019  . Macrocytic anemia 07/09/2019  . Thrombocytopenia ([place]) 07/09/2019  . Pressure injury of buttock, stage 3 ([place]) 07/05/2019  . Weakness 07/15/2018  . Dehydration 07/15/2018  . Acute on chronic kidney failure ([place]) 07/15/2018  . Hypertriglyceridemia 02/07/2017  . Vitamin D deficiency 08/06/2016  . Physical exam 08/06/2016  . Abnormality of gait 04/23/2016  . Hypothyroidism 02/05/2016  . HTN (hypertension) 02/05/2016  . Dementia ([place]) 09/23/2015  . Paresthesia 01/28/2014  . Hyperreflexia 01/28/2014  . GERD 01/16/2008  . OTHER POSTOPERATIVE FUNCTIONAL DISORDERS 01/16/2008  . TUBULOVILLOUS ADENOMA, COLON, HX OF 01/10/2008    Orientation RESPIRATION BLADDER Height & Weight     Self  Normal Incontinent Weight: 147 lb 11.3 oz (67 kg) Height:  5' (152.4 cm) (per pt state)  BEHAVIORAL SYMPTOMS/MOOD NEUROLOGICAL BOWEL NUTRITION STATUS      Incontinent Diet (Heart healthy)  AMBULATORY STATUS COMMUNICATION OF NEEDS Skin   Limited Assist Verbally Normal                        Personal Care Assistance Level of Assistance  Bathing, Feeding, Dressing Bathing Assistance: Limited assistance Feeding assistance: Limited assistance Dressing Assistance: Limited assistance     Functional Limitations Info  Sight, Hearing, Speech Sight Info: Adequate Hearing Info: Adequate Speech Info: Adequate    SPECIAL CARE FACTORS FREQUENCY  PT (By licensed PT)     PT Frequency: Evaluated 7/29 OT Frequency: 5x a week            Contractures Contractures Info: Not present    Additional Factors Info  Code Status, Allergies Code Status Info: DNR Allergies Info: Bee venom, Penicillins           Current Medications (08/14/2019):  This is the current hospital active medication list Current Facility-Administered Medications  Medication Dose Route Frequency Provider Last Rate Last Admin  . acetaminophen (TYLENOL) tablet 650 mg  650 mg Oral Q6H PRN Wynetta Fines T, MD   650 mg at 08/12/19 2150   Or  . acetaminophen (TYLENOL) suppository 650 mg  650 mg Rectal Q6H PRN Wynetta Fines T, MD      . enoxaparin (LOVENOX) injection 65 mg  65 mg Subcutaneous Q24H Dang, Thuy D, RPH   65 mg at 08/13/19 1751  . feeding supplement (ENSURE ENLIVE) (ENSURE ENLIVE) liquid 237 mL  237 mL Oral BID BM Wynetta Fines T, MD   237 mL at 08/14/19 0847  . fluticasone (FLONASE) 50 MCG/ACT nasal spray 2 spray  2 spray Each Nare Daily PRN Lequita Halt, MD      .  levothyroxine (SYNTHROID) tablet 75 mcg  75 mcg Oral QAC breakfast Lequita Halt, MD   75 mcg at 08/14/19 0556  . memantine (NAMENDA) tablet 10 mg  10 mg Oral BID Wynetta Fines T, MD   10 mg at 08/14/19 0847  . multivitamin with minerals tablet 1 tablet  1 tablet Oral Daily Dahal, Marlowe Aschoff, MD   1 tablet at 08/14/19 0847  . sodium chloride flush (NS) 0.9 % injection 3 mL  3 mL Intravenous Q12H Lequita Halt, MD   3 mL at 08/14/19 4039     Discharge Medications: Please see discharge summary for a list of discharge medications.  Relevant Imaging  Results:  Relevant Lab Results:   Additional Information [phone]  Sable Feil, LCSW

## 2019-08-14 NOTE — TOC Progression Note (Addendum)
Transition of Care [facility]) - Progression Note    Patient Details  Name: Kristina [name] MRN: [id number] Date of Birth: 06/29/1931  Transition of Care [facility]) CM/SW Contact  Kristina Salina [place] Homer, LCSW Phone Number: 08/14/2019, 6:43 PM  Clinical Narrative:  Short-term rehab recommended and CSW talked with daughter Kristina [name] ([phone]) regarding SNF placement. Family wants [facility] or [facility]. Patient will be going to the facility for rehab private pay per daughter. CSW informed by daughter that her mother has been vaccinated, and she has her vaccination card.   [facility] currently has no beds and Kristina [name] with [facility] will f/u with  CSW Wednesday morning (8/4) regarding bed availability. Daughter also provided CSW with another facility alternative - [place] and call made to admissions director Kristina [name] regarding patient. They can accept patient and Kristina [name] advised that CSW will f/u with her on Wednesday.        Expected Discharge Plan: [place] Barriers to Discharge: Continued Medical Work up  Expected Discharge Plan and Services Expected Discharge Plan: [place] arrangements for the past 2 months: [place], [place]                                       Social Determinants of Health (SDOH) Interventions    Readmission Risk Interventions Readmission Risk Prevention Plan 07/10/2019  Post Dischage Appt Not Complete  Appt Comments plan for SNF  Medication Screening Complete  Transportation Screening Complete  PCP or Specialist Appt within 5-7 Days Not Complete  Not Complete comments plan for SNF  Home Care Screening Complete  Medication Review (RN CM) Referral to Pharmacy  Some recent data might be hidden

## 2019-08-14 NOTE — Progress Notes (Signed)
Physical Therapy Treatment Patient Details Name: Kristina [name] MRN: [id number] DOB: [birth year] Today's Date: 08/14/2019    History of Present Illness 86 y.o. female with medical history significant of advanced dementia, hypertension, BPPV, GERD, hyperlipidemia, hypothyroidism, CKD stage III, presents to ED on 7/28 with near syncope episodes. Pt additionally with elevated troponins, now downtrending and cardiology suspects secondary to hypotension.    PT Comments    Patient was received laying in hospital bed. A&Ox2 to name and place. Could not recall birthday, month, year, or situation. She was able to transfer supine <> sit and sit <> stand with assistance and verbal/tactile cueing and use of RW. Patient ambulated 77' in hallway with RW. She needed repeated cueing to remind her to move the RW closer to her and was drifting left and right while walking. See below for assistance levels. She experienced an episode or bowel incontinence during gait training and also mentioned that she "doesn't know when she is going to have to use the bathroom ever." Patient was confused throughout session, hyperfocused on certain things such as her socks or gown, required repeated cueing, and followed commands inconsistently. She was left in bed with tray table in front of her, call bell within reach, and bed alarm set. Nursing staff was notified that she needed a new purewick inserted after treatment session.     Follow Up Recommendations  SNF;Supervision/Assistance - 24 hour     Equipment Recommendations  None recommended by PT    Recommendations for Other Services       Precautions / Restrictions Precautions Precautions: Fall Restrictions Weight Bearing Restrictions: No    Mobility  Bed Mobility Overal bed mobility: Needs Assistance Bed Mobility: Supine to Sit     Supine to sit: Min assist;HOB elevated     General bed mobility comments: verbal and tactile cues to grab handrail. needed some  help to sit upright  Transfers Overall transfer level: Needs assistance Equipment used: Rolling walker (2 wheeled) Transfers: Sit to/from Stand Sit to Stand: Min assist         General transfer comment: min assist x 1. verbal and tactile cuing for hand placement when rising/sitting.  Ambulation/Gait Ambulation/Gait assistance: Min assist Gait Distance (Feet): 75 Feet Assistive device: Rolling walker (2 wheeled) Gait Pattern/deviations: Staggering right;Drifts right/left Gait velocity: decreased   General Gait Details: Needs help to stay close to RW, dritfing while walking. needed verbal and tactile cueing.   Stairs             Wheelchair Mobility    Modified Rankin (Stroke Patients Only)       Balance Overall balance assessment: Needs assistance Sitting-balance support: No upper extremity supported;Feet supported Sitting balance-Leahy Scale: Fair     Standing balance support: Bilateral upper extremity supported;During functional activity Standing balance-Leahy Scale: Poor                              Cognition Arousal/Alertness: Awake/alert Behavior During Therapy: WFL for tasks assessed/performed Overall Cognitive Status: History of cognitive impairments - at baseline Area of Impairment: Safety/judgement;Following commands;Awareness;Problem solving;Memory;Attention;Orientation                 Orientation Level: Disoriented to;Time;Situation (oriented to name and place. could not recall birthday, month, year.) Current Attention Level: Sustained Memory: Decreased short-term memory;Decreased recall of precautions Following Commands: Follows one step commands with increased time;Follows one step commands inconsistently Safety/Judgement: Decreased awareness of deficits;Decreased awareness of safety  Awareness: Emergent Problem Solving: Decreased initiation;Difficulty sequencing;Requires verbal cues;Requires tactile cues;Slow  processing General Comments: A&Ox2. dementia at baseline. Could not remember month or date of birthday. Did not know how old her grandchildren were. poor safety awareness. Stated she does not know when she needs to use the restroom. confused.      Exercises      General Comments        Pertinent Vitals/Pain Pain Assessment: No/denies pain    Home Living                      Prior Function            PT Goals (current goals can now be found in the care plan section) Acute Rehab PT Goals Patient Stated Goal: go home PT Goal Formulation: With patient Time For Goal Achievement: 08/23/19 Potential to Achieve Goals: Good Progress towards PT goals: Progressing toward goals    Frequency    Min 2X/week      PT Plan      Co-evaluation              AM-PAC PT "6 Clicks" Mobility   Outcome Measure  Help needed turning from your back to your side while in a flat bed without using bedrails?: A Little Help needed moving from lying on your back to sitting on the side of a flat bed without using bedrails?: A Little Help needed moving to and from a bed to a chair (including a wheelchair)?: A Little Help needed standing up from a chair using your arms (e.g., wheelchair or bedside chair)?: A Little Help needed to walk in hospital room?: A Little Help needed climbing 3-5 steps with a railing? : A Lot 6 Click Score: 17    End of Session Equipment Utilized During Treatment: Gait belt Activity Tolerance: Patient tolerated treatment well Patient left: in bed;with call bell/phone within reach;with bed alarm set   PT Visit Diagnosis: Other abnormalities of gait and mobility (R26.89);Muscle weakness (generalized) (M62.81)     Time:  -     Charges:                         Livingston Diones, SPT, ATC

## 2019-08-14 NOTE — Progress Notes (Signed)
Nutrition Follow-up  DOCUMENTATION CODES:   Not applicable  INTERVENTION:  Ensure Enlive po TID, each supplement provides 350 kcal and 20 grams of protein  Continue feeding assistance with all meals  Continue Magic cup TID with meals, each supplement provides 290 kcal and 9 grams of protein  Continue MVI daily  NUTRITION DIAGNOSIS:   Increased nutrient needs related to wound healing as evidenced by estimated needs.  ongoing  GOAL:   Patient will meet greater than or equal to 90% of their needs  progressing  MONITOR:   PO intake, Supplement acceptance, Skin, Weight trends, Labs, I & O's  REASON FOR ASSESSMENT:   Malnutrition Screening Tool    ASSESSMENT:   Pt presented with near syncope episodes. PMH includes advanced dementia, HTN, HLD, hypothyroidism, CKD stage III.  7/30 s/p placement of IVC filter  Pt noted to be oriented to place only. Pt pending SNF placement.   No PO intake documented. Discussed pt with LPN who reports pt typically drinks at least 1/2 of the Ensures, and will take several bites of her meals and Magic Cups with significant encouragement.   UOP: 1,178ml x24 hours I/O: -1,035.48ml since admit  Labs reviewed. Medications: Ensure Enlive BID, MVI  Diet Order:   Diet Order            Diet Heart Room service appropriate? No; Fluid consistency: Thin  Diet effective now                 EDUCATION NEEDS:   Not appropriate for education at this time  Skin:  Skin Assessment: Skin Integrity Issues: Skin Integrity Issues:: Stage II Stage II: L/R buttocks  Last BM:  7/29  Height:   Ht Readings from Last 1 Encounters:  08/09/19 5' (1.524 m)    Weight:   Wt Readings from Last 1 Encounters:  08/14/19 67 kg    BMI:  Body mass index is 28.85 kg/m.  Estimated Nutritional Needs:   Kcal:  1450-1650  Protein:  75-90 grams  Fluid:  >/=1.45L/d    Larkin Ina, MS, RD, LDN RD pager number and weekend/on-call pager number  located in [place].

## 2019-08-14 NOTE — Progress Notes (Signed)
PROGRESS NOTE  Kristina [name]  DOB: July 03, 1931  PCP: Midge Minium, MD WUX:[id number]  DOA: 08/08/2019  LOS: 5 days   Chief Complaint  Patient presents with  . Loss of Consciousness   Brief narrative: Kristina [name] is a 86 y.o. female with PMH significant for advanced dementia, hypertension, hyperlipidemia, hypothyroidism, CKD stage III who presented to ED on 7/28 with near syncopeepisodes.  Patient is advanced baseline dementia, most of the history provided by patient's daughter at the bedside.  Patient was admitted about 4 weeks ago for similar complaints. During that admission, she was noted to have orthostatic hypotension, midodrine was started.  However daughter states patient never took midodrine.  Patient has poor oral intake and requires frequent reminders and supervision.  Daughter has been quite tied up with her job recently,and unable to supervise her as before.  On the day of admission, patient complained of lightheadedness, wobbly gait and had 1 episode of syncope.  Daughter was able to hold her down and hence the patient did not have significant injuries.   EMS found patient to be tachycardia and hypotensive.  The ED, patient was afebrile, heart rate 109, blood pressure 98/61.  Both improved after IV hydration.   Labs showed creatinine 1.43 which is at baseline. Troponin elevated to 182>>654>>455 Lactic acid 1.4 WBC 13.8 Ultrasound duplex of lower extremities showed DVT in the right femoral, right popliteal and right peroneal vein.  Subjective: Patient was seen and examined this morning.   Propped up in bed.  Not in distress no new symptoms.  Pending SNF placement.  Assessment/Plan: Recurrent syncope -Secondary to orthostatic hypotension, poor oral intake. -No evidence of infection. -Cardiology consultation was obtained. -Patient received aggressive hydration and IV albumin. -We will allow her systolic blood pressure to drift to 140s to 160s. -Advised  for compression stockings and abdominal binder -Per cardiology, If these conservative interventions do not provide relief of symptoms, midodrine can be considered, but it is important to impress upon the patient that she cannot lie down for 4 hours after taking that medication.  Acute DVT -Presented with right lower extremity swelling.  Noted to have acute DVT of right femoral, right popliteal and right peroneal vein. -Discussed with patient and her daughter.  Patient is not a good candidate for long-term anticoagulation because of frequent syncope and falls. -7/30, patient underwent a successful IVC filter placement by IR.  Per IR, because of her comorbidities, it remains as a permanent filter and want be retrieved.  Elevated troponin -Secondary to prolonged hypotension -Troponin peaked at 654, down trended. -No chest pain or shortness of breath.   -Not a candidate for revascularization.  Generalized weakness -PT evaluation obtained.  SNF recommended.  Apparently, patient was recently at an SNF for 4 weeks and was discharged to home.  She was at home for only about a week and ended up coming back to the hospital. -Discussed with patient's daughter.  Agrees with SNF.  Case management working on that.  AKI on CKD 3a-3b -GFR baseline 32-57, presented with a creatinine of 1.47, improved to 0.84 on last blood work on 8/1.  Leukocytosis -No evidence of infection, lactic acid normal.  WBC count trending down.  Mobility: PT eval Code Status:   Code Status: DNR  Nutritional status: Body mass index is 28.85 kg/m. Nutrition Problem: Increased nutrient needs Etiology: wound healing Signs/Symptoms: estimated needs Diet Order            Diet Heart Room service appropriate? No;  Fluid consistency: Thin  Diet effective now                DVT prophylaxis:   Antimicrobials:  None Fluid: not on IV fluid. Consultants: Cardiology Family Communication:  None at bedside  Status is:  Inpatient  Remains inpatient appropriate because pending SNF placement  Dispo: The patient is from: Home              Anticipated d/c is to: SNF              Anticipated d/c date is: whenever bed/authorization is available.              Patient currently is medically stable to d/c.  Pressure Injury 07/09/19 Buttocks Left;Right;Medial Stage 2 -  Partial thickness loss of dermis presenting as a shallow open injury with a red, pink wound bed without slough. (Active)  07/09/19 2326  Location: Buttocks  Location Orientation: Left;Right;Medial  Staging: Stage 2 -  Partial thickness loss of dermis presenting as a shallow open injury with a red, pink wound bed without slough.  Wound Description (Comments):   Present on Admission: Yes     Pressure Injury 07/09/19 Buttocks Left Stage 2 -  Partial thickness loss of dermis presenting as a shallow open injury with a red, pink wound bed without slough. (Active)  07/09/19 2313  Location: Buttocks  Location Orientation: Left  Staging: Stage 2 -  Partial thickness loss of dermis presenting as a shallow open injury with a red, pink wound bed without slough.  Wound Description (Comments):   Present on Admission: Yes     Pressure Injury 07/09/19 Sacrum (Active)  07/09/19 2313  Location: Sacrum  Location Orientation:   Staging:   Wound Description (Comments):   Present on Admission:     Infusions:    Scheduled Meds: . enoxaparin (LOVENOX) injection  65 mg Subcutaneous Q24H  . feeding supplement (ENSURE ENLIVE)  237 mL Oral BID BM  . levothyroxine  75 mcg Oral QAC breakfast  . memantine  10 mg Oral BID  . multivitamin with minerals  1 tablet Oral Daily  . sodium chloride flush  3 mL Intravenous Q12H    Antimicrobials: Anti-infectives (From admission, onward)   None      PRN meds: acetaminophen **OR** acetaminophen, fluticasone   Objective: Vitals:   08/13/19 1953 08/14/19 0809  BP: 139/61 (!) 142/73  Pulse: 85 76  Resp: 20 13   Temp: 97.9 F (36.6 C) 97.8 F (36.6 C)  SpO2: 95% 97%    Intake/Output Summary (Last 24 hours) at 08/14/2019 1446 Last data filed at 08/14/2019 1440 Gross per 24 hour  Intake 225 ml  Output 1800 ml  Net -1575 ml   Filed Weights   08/11/19 0458 08/12/19 0500 08/14/19 0309  Weight: 66.1 kg 67.2 kg 67 kg   Weight change:  Body mass index is 28.85 kg/m.   Physical Exam: General exam: Appears calm and comfortable.  Not in physical distress Skin: No rashes, lesions or ulcers. HEENT: Atraumatic, normocephalic, supple neck, no obvious bleeding Lungs: Clear to auscultation bilaterally. CVS: Regular rate and rhythm, no murmur GI/Abd soft, nontender, nondistended, bowel sound present CNS: Alert, awake, oriented to place and person. Psychiatry: Depressed look Extremities: Right lower extremity trace pedal edema, left normal, no calf tenderness.  Data Review: I have personally reviewed the laboratory data and studies available.  Recent Labs  Lab 08/08/19 1341 08/10/19 0138 08/12/19 0157 08/13/19 0119  WBC 13.8* 11.3* 9.8 9.4  NEUTROABS 11.4*  --  6.3  --   HGB 12.4 10.9* 11.4* 11.0*  HCT 40.5 33.8* 35.0* 33.9*  MCV 103.1* 99.4 96.7 99.1  PLT 237 228 229 215   Recent Labs  Lab 08/08/19 1341 08/09/19 0507 08/10/19 0138 08/12/19 0157  NA 145 146* 146* 138  K 3.7 3.7 3.5 4.1  CL 110 111 110 108  CO2 22 22 25 22   GLUCOSE 148* 99 131* 100*  BUN 31* 29* 26* 17  CREATININE 1.47* 1.15* 0.92 0.84  CALCIUM 8.9 8.3* 8.9 8.1*  MG 1.9  --   --   --    Lab Results  Component Value Date   HGBA1C 6.5 08/06/2016       Component Value Date/Time   CHOL 128 07/05/2019 0923   TRIG 136.0 07/05/2019 0923   HDL 34.10 (L) 07/05/2019 0923   CHOLHDL 4 07/05/2019 0923   VLDL 27.2 07/05/2019 0923   LDLCALC 67 07/05/2019 0923   LDLCALC 72 03/14/2018 0938   LDLDIRECT 115.0 08/06/2016 1034   Signed, Terrilee Croak, MD [facility] Pager: [phone] (Secure Chat  preferred). 08/14/2019

## 2019-08-15 LAB — SARS CORONAVIRUS 2 BY RT PCR (HOSPITAL ORDER, PERFORMED IN ~~LOC~~ HOSPITAL LAB): SARS Coronavirus 2: NEGATIVE

## 2019-08-15 MED ORDER — APIXABAN 5 MG PO TABS
5.0000 mg | ORAL_TABLET | Freq: Two times a day (BID) | ORAL | 2 refills | Status: DC
Start: 2019-08-15 — End: 2019-12-20

## 2019-08-15 NOTE — TOC Progression Note (Addendum)
Transition of Care (TOC) - Progression Note  *Discharge to [facility] once auth received   Patient Details  Name: Kristina [name] MRN: [id number] Date of Birth: 1931/02/08  Transition [facility]) CM/SW Contact  Sharlet Salina [place] Homer, LCSW Phone Number: 08/15/2019, 4:45 PM  Clinical Narrative:  CSW talked several times with daughter Garret Reddish ([phone] regarding her mother's discharge disposition. [facility] was chosen for LTC - private pay, however daughter and CSW advised this morning that they can't accept patient's at this time due to a patient testing positive for COVID at their facility. [facility] was contacted, as this was one of daughter's facility choices and they could accept patient today and patient was provided with room per day rates.   During conversation short-term rehab came up and daughter indicated that she did not think [facility] rehab was an option. CSW explained that patient's clinicals could be submitted to her insurance and daughter in agreement. Call made to [facility]), admissions director at [facility] and update provided. Call made to [facility] and they do manage patient. C/linicals faxed to [facility] and MD asked to place order for OT evaluation.      Expected Discharge Plan: [place] Barriers to Discharge: Continued Medical Work up  Expected Discharge Plan and Services Expected Discharge Plan: [place] arrangements for the past 2 months: [place], [place] Expected Discharge Date: 08/15/19                                   Social Determinants of Health (SDOH) Interventions  No SDOH interventions requested or needed at this time  Readmission Risk Interventions Readmission Risk Prevention Plan 07/10/2019  Post Dischage Appt Not Complete  Appt Comments plan for SNF  Medication Screening Complete  Transportation Screening Complete  PCP or Specialist Appt within 5-7  Days Not Complete  Not Complete comments plan for SNF  Home Care Screening Complete  Medication Review (RN CM) Referral to Pharmacy  Some recent data might be hidden

## 2019-08-15 NOTE — Discharge Summary (Signed)
Physician Discharge Summary  Kristina [name] ZJI:[id number] DOB: 1931-02-03 DOA: 08/08/2019  PCP: Midge Minium, MD  Admit date: 08/08/2019 Discharge date: 08/15/2019  Admitted From: Home  Disposition:  SNF   Recommendations for Outpatient Follow-up:  1. Follow up with PCP in 1 week after discharge from SNF 2. Please obtain BMP/CBC in one week     Home Health: N/A  Equipment/Devices: TBD at SNF  Discharge Condition: Fair  CODE STATUS: DNR Diet recommendation: Regular  Brief/Interim Summary: 86 y.o. F with dementia, HTN, hypothyroidism and CKD III who presented with weakness, syncope.  Patient recently admitted for orthostatic symptoms, started on midodrine. Unfortunately went home never started midodrine, and daughter had noted decreased fluid intake.  Leading up to this hospitalization, the patient was complaining of feeling lightheaded, and had several episodes of wobbly gait, finally collapsing without injury. In the ER patient found to have soft blood pressure, tachycardia which improved with IV fluids.        PRINCIPAL HOSPITAL DIAGNOSIS: Orthostatic hypotension    Discharge Diagnoses:   Orthostatic hypotension causing syncope Patient admitted, given fluids. She remained orthostatic, cardiology were consulted. She was started on an abdominal binder. Midodrine was considered, but her symptoms seem to improve with abdominal binder alone. -Monitor symptoms, if recurrent orthostasis, consider cautious initiation of midodrine  Acute DVT The patient was noted to have leg swelling. Ultrasound confirmed acute left lower extremity DVT. Patient was started on Lovenox and completed 5 days. Due to risk of long-term anticoagulation, an IVC filter was placed. This is planned to be an permanent filter.  After discussion of benefit and risk with daughter, short-term anticoagulation for 3 months with apixaban was recommended. -Take apixaban for 3 months, stop November  1   Dementia  Elevated troponin Cardiology were consulted, the patient was asymptomatic, her troponin peaked at 654, thought to be from demand ischemia, not a candidate for revascularization, ACS was ruled out   AKI on CKD stage IIIa Baseline GFR 57, creatinine 1.47 on admission, improved to normal with fluids.  Hypothyroidism    Discharge Instructions  Discharge Instructions    Diet - low sodium heart healthy   Complete by: As directed    Discharge instructions   Complete by: As directed    From Dr. Loleta Books: You were admitted for passing out. This was from orthostatic hypotension You should use the abdominal binder to prevent this. If the binder does not help, your doctor at [facility] should start midodrine  Do not lie down for 2 hours after taking midodrine  While you were here, you were also diagnosed with a blood clot in the leg, DVT. Take apixaban/Eliquis 5 mg twice daily for 3 months and stop at the end of October   Go see your primary care doctor within 1 week after moving from rehab to long term care   Discharge wound care:   Complete by: As directed    Apply foam dressing to wound   Increase activity slowly   Complete by: As directed      Allergies as of 08/15/2019      Reactions   Bee Venom Shortness Of Breath, Itching, Rash, Other (See Comments)   Almost passed out, also   Penicillins Rash   Has patient had a PCN reaction causing immediate rash, facial/tongue/throat swelling, SOB or lightheadedness with hypotension: Yes Has patient had a PCN reaction causing severe rash involving mucus membranes or skin necrosis: No Has patient had a PCN reaction that required  hospitalization: Unknown Has patient had a PCN reaction occurring within the last 10 years: No If all of the above answers are "NO", then may proceed with Cephalosporin use.      Medication List    STOP taking these medications   midodrine 2.5 MG tablet Commonly known as: PROAMATINE      TAKE these medications   apixaban 5 MG Tabs tablet Commonly known as: ELIQUIS Take 1 tablet (5 mg total) by mouth 2 (two) times daily.   [employer] D3 50 MCG (2000 UT) Caps Generic drug: Cholecalciferol TAKE 1 CAPSULE DAILY   diphenhydrAMINE 25 MG tablet Commonly known as: BENADRYL Take 2 tablets (50 mg total) by mouth every 6 (six) hours as needed for itching (or hives).   EPINEPHrine 0.3 mg/0.3 mL Soaj injection Commonly known as: EPI-PEN Inject 0.3 mLs (0.3 mg total) into the muscle once as needed for up to 1 dose (severe allergic reaction).   feeding supplement (ENSURE ENLIVE) Liqd Take 237 mLs by mouth 2 (two) times daily between meals.   fluticasone 50 MCG/ACT nasal spray Commonly known as: FLONASE Place 2 sprays into both nostrils daily as needed (for seasonal allergies). SPRAY 2 SPRAYS INTO EACH NOSTRIL EVERY DAY   levothyroxine 75 MCG tablet Commonly known as: SYNTHROID TAKE 1 TABLET BY MOUTH EVERY DAY What changed: when to take this   loperamide 2 MG capsule Commonly known as: IMODIUM Take 2 mg by mouth as needed for diarrhea or loose stools.   memantine 10 MG tablet Commonly known as: NAMENDA TAKE 1 TABLET BY MOUTH TWICE A DAY. Please call [phone] to scheduled a yearly appt or may also request refills from PCP. What changed:   how much to take  how to take this  when to take this   multivitamin with minerals Tabs tablet Take 1 tablet by mouth daily.            Discharge Care Instructions  (From admission, onward)         Start     Ordered   08/15/19 0000  Discharge wound care:       Comments: Apply foam dressing to wound   08/15/19 1606          Allergies  Allergen Reactions  . Bee Venom Shortness Of Breath, Itching, Rash and Other (See Comments)    Almost passed out, also  . Penicillins Rash    Has patient had a PCN reaction causing immediate rash, facial/tongue/throat swelling, SOB or lightheadedness with hypotension: Yes Has patient  had a PCN reaction causing severe rash involving mucus membranes or skin necrosis: No Has patient had a PCN reaction that required hospitalization: Unknown Has patient had a PCN reaction occurring within the last 10 years: No If all of the above answers are "NO", then may proceed with Cephalosporin use.     Consultations:  Cardiology  IR   Procedures/Studies: IR IVC FILTER PLMT / S&I Burke Keels GUID/MOD SED  Result Date: 08/10/2019 INDICATION: Lower extremity DVT. Poor candidate for anticoagulation (dementia with frequent falls). Request made for IVC filter placement for caval interruption purposes. Given patient's advanced age and multiple medical comorbidities, the IVC filter will be considered a permanent device in the patient not be actively followed by the interventional radiology service for retrieval. EXAM: ULTRASOUND GUIDANCE FOR VASCULAR ACCESS IVC CATHETERIZATION AND VENOGRAM IVC FILTER INSERTION COMPARISON:  CT abdomen and pelvis-11/20/2010 MEDICATIONS: None. ANESTHESIA/SEDATION: Fentanyl 25 mcg IV; Versed 0.5 mg IV Sedation Time: 11 minutes; The patient was  continuously monitored during the procedure by the interventional radiology nurse under my direct supervision. CONTRAST:  35 mL OMNIPAQUE IOHEXOL 300 MG/ML  SOLN FLUOROSCOPY TIME:  1 minute, 12 seconds (26 mGy) COMPLICATIONS: None immediate PROCEDURE: Informed consent was obtained from the patient's daughter following explanation of the procedure, risks, benefits and alternatives. The patient understands, agrees and consents for the procedure. All questions were addressed. A time out was performed prior to the initiation of the procedure. Maximal barrier sterile technique utilized including caps, mask, sterile gowns, sterile gloves, large sterile drape, hand hygiene, and Betadine prep. Under sterile condition and local anesthesia, right internal jugular venous access was performed with ultrasound. An ultrasound image was saved and sent to  PACS. Over a guidewire, the IVC filter delivery sheath and inner dilator were advanced into the IVC just above the IVC bifurcation. Contrast injection was performed for an IVC venogram. Through the delivery sheath, a retrievable Denali IVC filter was deployed below the level of the renal veins and above the IVC bifurcation. Limited post deployment venacavagram was performed. The delivery sheath was removed and hemostasis was obtained with manual compression. A dressing was placed. The patient tolerated the procedure well without immediate post procedural complication. FINDINGS: The IVC is patent. No evidence of thrombus, stenosis, or occlusion. No variant venous anatomy. Successful placement of the IVC filter below the level of the renal veins. IMPRESSION: Successful ultrasound and fluoroscopically guided placement of an infrarenal retrievable IVC filter via right jugular approach. PLAN: Due to patient related comorbidities and/or clinical necessity, this IVC filter should be considered a permanent device. This patient will not be actively followed for future filter retrieval. Electronically Signed   By: Sandi Mariscal M.D.   On: 08/10/2019 16:08   DG Chest Port 1 View  Result Date: 08/08/2019 CLINICAL DATA:  Syncope. EXAM: PORTABLE CHEST 1 VIEW COMPARISON:  July 15, 2018. FINDINGS: The heart size and mediastinal contours are within normal limits. Both lungs are clear. No pneumothorax or pleural effusion is noted. The visualized skeletal structures are unremarkable. IMPRESSION: No active disease. Electronically Signed   By: Marijo Conception M.D.   On: 08/08/2019 13:55   VAS [place] LOWER EXTREMITY VENOUS (DVT)  Result Date: 08/11/2019  Lower Venous DVTStudy Indications: Edema.  Comparison Study: No prior study on file Performing Technologist: Sharion Dove RVS  Examination Guidelines: A complete evaluation includes B-mode imaging, spectral Doppler, color Doppler, and power Doppler as needed of all accessible portions  of each vessel. Bilateral testing is considered an integral part of a complete examination. Limited examinations for reoccurring indications may be performed as noted. The reflux portion of the exam is performed with the patient in reverse Trendelenburg.  +---------+---------------+---------+-----------+----------+--------------+ RIGHT    CompressibilityPhasicitySpontaneityPropertiesThrombus Aging +---------+---------------+---------+-----------+----------+--------------+ CFV      Full           Yes      Yes                                 +---------+---------------+---------+-----------+----------+--------------+ SFJ      Full                                                        +---------+---------------+---------+-----------+----------+--------------+ FV Prox  None  Acute          +---------+---------------+---------+-----------+----------+--------------+ FV Mid   None                                         Acute          +---------+---------------+---------+-----------+----------+--------------+ FV DistalNone                                         Acute          +---------+---------------+---------+-----------+----------+--------------+ PFV      Full                                                        +---------+---------------+---------+-----------+----------+--------------+ POP      None           No       No                   Acute          +---------+---------------+---------+-----------+----------+--------------+ PTV      None                                         Acute          +---------+---------------+---------+-----------+----------+--------------+ PERO     None                                         Acute          +---------+---------------+---------+-----------+----------+--------------+   +---------+---------------+---------+-----------+----------+--------------+ LEFT      CompressibilityPhasicitySpontaneityPropertiesThrombus Aging +---------+---------------+---------+-----------+----------+--------------+ CFV      Full           Yes      Yes                                 +---------+---------------+---------+-----------+----------+--------------+ SFJ      Full                                                        +---------+---------------+---------+-----------+----------+--------------+ FV Prox  Full                                                        +---------+---------------+---------+-----------+----------+--------------+ FV Mid   Full                                                        +---------+---------------+---------+-----------+----------+--------------+  FV DistalFull                                                        +---------+---------------+---------+-----------+----------+--------------+ PFV      Full                                                        +---------+---------------+---------+-----------+----------+--------------+ POP      Full           Yes      Yes                                 +---------+---------------+---------+-----------+----------+--------------+ PTV      Full                                                        +---------+---------------+---------+-----------+----------+--------------+ PERO     Full                                                        +---------+---------------+---------+-----------+----------+--------------+     Summary: RIGHT: - Findings consistent with acute deep vein thrombosis involving the right femoral vein, right popliteal vein, right posterior tibial veins, and right peroneal veins.  LEFT: - There is no evidence of deep vein thrombosis in the lower extremity.  *See table(s) above for measurements and observations. Electronically signed by Deitra Mayo MD on 08/11/2019 at 8:54:29 AM.    Final       Subjective: Patient  feeling well.  No dizziness with standing, no confusion, no fever, no cough.  Discharge Exam: Vitals:   08/14/19 2321 08/15/19 0748  BP: 110/65 122/65  Pulse: 85 85  Resp: 16 16  Temp: 97.7 F (36.5 C) 98 F (36.7 C)  SpO2: 98% 97%   Vitals:   08/14/19 1515 08/14/19 1608 08/14/19 2321 08/15/19 0748  BP:  (!) 132/95 110/65 122/65  Pulse:  88 85 85  Resp:  14 16 16   Temp:  98.4 F (36.9 C) 97.7 F (36.5 C) 98 F (36.7 C)  TempSrc:   Oral   SpO2: 100% 99% 98% 97%  Weight:      Height:        General: Pt is alert, awake, not in acute distress Cardiovascular: RRR, nl S1-S2, no murmurs appreciated.   No LE edema.   Respiratory: Normal respiratory rate and rhythm.  CTAB without rales or wheezes. Abdominal: Abdomen soft and non-tender.  No distension or HSM.   Neuro/Psych: Strength symmetric in upper and lower extremities.  Judgment and insight appear moderately impaired.   The results of significant diagnostics from this hospitalization (including imaging, microbiology, ancillary and laboratory) are listed below for reference.     Microbiology: Recent Results (from the past 240 hour(s))  SARS  Coronavirus 2 by RT PCR     Status: None   Collection Time: 08/08/19  4:36 PM  Result Value Ref Range Status   SARS Coronavirus 2 NEGATIVE NEGATIVE Final    Comment: (NOTE) Result indicates the ABSENCE of SARS-CoV-2 RNA in the patient specimen.   The lowest concentration of SARS-CoV-2 viral copies this assay can detect in nasopharyngeal swab specimens is 500 copies / mL.  A negative result does not preclude SARS-CoV-2 infection and should not be used as the sole basis for patient management decisions. A negative result may occur with improper specimen collection / handling, submission of a specimen other than nasopharyngeal swab, presence of viral mutation(s) within the areas targeted by this assay, and inadequate number of viral copies (<500 copies / mL) present.  Negative  results must be combined with clinical observations, patient history, and epidemiological information.   The expected result is NEGATIVE.   Patient Fact Sheet:  BlogSelections.co.uk    Provider Fact Sheet:  https://lucas.com/    This test is not yet approved or cleared by the [place] FDA and  has been [place] orized for detection and/or diagnosis of SARS-CoV-2 by FDA under an Emergency Use Authorization (EUA).  This EUA will remain in effect (meaning this test can be used) for the duration of  the COVID-19 declaration under Section 564(b)(1) of the Act, 21 U.S.C. section 360bbb-3(b)(1), unless the authorization is terminated or revoked sooner  Performed at [facility], [place] [address] [place], [place] [address]   SARS Coronavirus 2 by RT PCR (hospital order, performed in [facility] hospital lab) Nasopharyngeal Nasopharyngeal Swab     Status: None   Collection Time: 08/15/19 12:31 PM   Specimen: Nasopharyngeal Swab  Result Value Ref Range Status   SARS Coronavirus 2 NEGATIVE NEGATIVE Final    Comment: (NOTE) SARS-CoV-2 target nucleic acids are NOT DETECTED.  The SARS-CoV-2 RNA is generally detectable in upper and lower respiratory specimens during the acute phase of infection. The lowest concentration of SARS-CoV-2 viral copies this assay can detect is 250 copies / mL. A negative result does not preclude SARS-CoV-2 infection and should not be used as the sole basis for treatment or other patient management decisions.  A negative result may occur with improper specimen collection / handling, submission of specimen other than nasopharyngeal swab, presence of viral mutation(s) within the areas targeted by this assay, and inadequate number of viral copies (<250 copies / mL). A negative result must be combined with clinical observations, patient history, and epidemiological information.  Fact Sheet for Patients:    StrictlyIdeas.no  Fact Sheet for Healthcare Providers: BankingDealers.co.za  This test is not yet approved or  cleared by the [place] FDA and has been authorized for detection and/or diagnosis of SARS-CoV-2 by FDA under an Emergency Use Authorization (EUA).  This EUA will remain in effect (meaning this test can be used) for the duration of the COVID-19 declaration under Section 564(b)(1) of the Act, 21 U.S.C. section 360bbb-3(b)(1), unless the authorization is terminated or revoked sooner.  Performed at [facility], [place] [address], [place], [place] [address]      Labs: BNP (last 3 results) Recent Labs    08/08/19 1728  BNP 78.2   Basic Metabolic Panel: Recent Labs  Lab 08/09/19 0507 08/10/19 0138 08/12/19 0157  NA 146* 146* 138  K 3.7 3.5 4.1  CL 111 110 108  CO2 22 25 22   GLUCOSE 99 131* 100*  BUN 29*  26* 17  CREATININE 1.15* 0.92 0.84  CALCIUM 8.3* 8.9 8.1*   Liver Function Tests: No results for input(s): AST, ALT, ALKPHOS, BILITOT, PROT, ALBUMIN in the last 168 hours. No results for input(s): LIPASE, AMYLASE in the last 168 hours. No results for input(s): AMMONIA in the last 168 hours. CBC: Recent Labs  Lab 08/10/19 0138 08/12/19 0157 08/13/19 0119  WBC 11.3* 9.8 9.4  NEUTROABS  --  6.3  --   HGB 10.9* 11.4* 11.0*  HCT 33.8* 35.0* 33.9*  MCV 99.4 96.7 99.1  PLT 228 229 215   Cardiac Enzymes: Recent Labs  Lab 08/09/19 0507  CKTOTAL 44   BNP: Invalid input(s): POCBNP CBG: Recent Labs  Lab 08/11/19 0448 08/12/19 0521 08/13/19 0556 08/13/19 0817 08/14/19 0809  GLUCAP 103* 86 77 101* 95  95   D-Dimer No results for input(s): DDIMER in the last 72 hours. Hgb A1c No results for input(s): HGBA1C in the last 72 hours. Lipid Profile No results for input(s): CHOL, HDL, LDLCALC, TRIG, CHOLHDL, LDLDIRECT in the last 72 hours. Thyroid function studies No results for input(s): TSH,  T4TOTAL, T3FREE, THYROIDAB in the last 72 hours.  Invalid input(s): FREET3 Anemia work up No results for input(s): VITAMINB12, FOLATE, FERRITIN, TIBC, IRON, RETICCTPCT in the last 72 hours. Urinalysis    Component Value Date/Time   COLORURINE YELLOW 07/09/2019 1444   APPEARANCEUR HAZY (A) 07/09/2019 1444   LABSPEC 1.025 07/09/2019 1444   PHURINE 5.0 07/09/2019 1444   GLUCOSEU NEGATIVE 07/09/2019 1444   HGBUR NEGATIVE 07/09/2019 1444   BILIRUBINUR NEGATIVE 07/09/2019 1444   KETONESUR NEGATIVE 07/09/2019 1444   PROTEINUR NEGATIVE 07/09/2019 1444   NITRITE NEGATIVE 07/09/2019 1444   LEUKOCYTESUR NEGATIVE 07/09/2019 1444   Sepsis Labs Invalid input(s): PROCALCITONIN,  WBC,  LACTICIDVEN Microbiology Recent Results (from the past 240 hour(s))  SARS Coronavirus 2 by RT PCR     Status: None   Collection Time: 08/08/19  4:36 PM  Result Value Ref Range Status   SARS Coronavirus 2 NEGATIVE NEGATIVE Final    Comment: (NOTE) Result indicates the ABSENCE of SARS-CoV-2 RNA in the patient specimen.   The lowest concentration of SARS-CoV-2 viral copies this assay can detect in nasopharyngeal swab specimens is 500 copies / mL.  A negative result does not preclude SARS-CoV-2 infection and should not be used as the sole basis for patient management decisions. A negative result may occur with improper specimen collection / handling, submission of a specimen other than nasopharyngeal swab, presence of viral mutation(s) within the areas targeted by this assay, and inadequate number of viral copies (<500 copies / mL) present.  Negative results must be combined with clinical observations, patient history, and epidemiological information.   The expected result is NEGATIVE.   Patient Fact Sheet:  BlogSelections.co.uk    Provider Fact Sheet:  https://lucas.com/    This test is not yet approved or cleared by the [place] FDA and  has been [place]  orized for detection and/or diagnosis of SARS-CoV-2 by FDA under an Emergency Use Authorization (EUA).  This EUA will remain in effect (meaning this test can be used) for the duration of  the COVID-19 declaration under Section 564(b)(1) of the Act, 21 U.S.C. section 360bbb-3(b)(1), unless the authorization is terminated or revoked sooner  Performed at [facility], [place] [address], [place], [place] [address]   SARS Coronavirus 2 by RT PCR (hospital order, performed in [facility] hospital lab) Nasopharyngeal Nasopharyngeal Swab  Status: None   Collection Time: 08/15/19 12:31 PM   Specimen: Nasopharyngeal Swab  Result Value Ref Range Status   SARS Coronavirus 2 NEGATIVE NEGATIVE Final    Comment: (NOTE) SARS-CoV-2 target nucleic acids are NOT DETECTED.  The SARS-CoV-2 RNA is generally detectable in upper and lower respiratory specimens during the acute phase of infection. The lowest concentration of SARS-CoV-2 viral copies this assay can detect is 250 copies / mL. A negative result does not preclude SARS-CoV-2 infection and should not be used as the sole basis for treatment or other patient management decisions.  A negative result may occur with improper specimen collection / handling, submission of specimen other than nasopharyngeal swab, presence of viral mutation(s) within the areas targeted by this assay, and inadequate number of viral copies (<250 copies / mL). A negative result must be combined with clinical observations, patient history, and epidemiological information.  Fact Sheet for Patients:   StrictlyIdeas.no  Fact Sheet for Healthcare Providers: BankingDealers.co.za  This test is not yet approved or  cleared by the [place] FDA and has been authorized for detection and/or diagnosis of SARS-CoV-2 by FDA under an Emergency Use Authorization (EUA).  This EUA will remain in effect (meaning this test can be used)  for the duration of the COVID-19 declaration under Section 564(b)(1) of the Act, 21 U.S.C. section 360bbb-3(b)(1), unless the authorization is terminated or revoked sooner.  Performed at [facility], [place] [address] [place], [place] [address]      Time coordinating discharge: 35 minutes       SIGNED:   Edwin Dada, MD  [facility] 08/15/2019, 4:07 PM

## 2019-08-16 ENCOUNTER — Encounter: Payer: Self-pay | Admitting: *Deleted

## 2019-08-16 DIAGNOSIS — I824Y2 Acute embolism and thrombosis of unspecified deep veins of left proximal lower extremity: Secondary | ICD-10-CM

## 2019-08-16 LAB — CBC
HCT: 34.3 % — ABNORMAL LOW (ref 36.0–46.0)
Hemoglobin: 11.2 g/dL — ABNORMAL LOW (ref 12.0–15.0)
MCH: 31.7 pg (ref 26.0–34.0)
MCHC: 32.7 g/dL (ref 30.0–36.0)
MCV: 97.2 fL (ref 80.0–100.0)
Platelets: 340 10*3/uL (ref 150–400)
RBC: 3.53 MIL/uL — ABNORMAL LOW (ref 3.87–5.11)
RDW: 14.6 % (ref 11.5–15.5)
WBC: 8.7 10*3/uL (ref 4.0–10.5)
nRBC: 0 % (ref 0.0–0.2)

## 2019-08-16 LAB — GLUCOSE, CAPILLARY: Glucose-Capillary: 103 mg/dL — ABNORMAL HIGH (ref 70–99)

## 2019-08-16 MED ORDER — APIXABAN 5 MG PO TABS
10.0000 mg | ORAL_TABLET | Freq: Two times a day (BID) | ORAL | Status: AC
  Administered 2019-08-16 (×2): 10 mg via ORAL
  Filled 2019-08-16 (×2): qty 2

## 2019-08-16 MED ORDER — APIXABAN 5 MG PO TABS
5.0000 mg | ORAL_TABLET | Freq: Two times a day (BID) | ORAL | Status: DC
  Administered 2019-08-17: 5 mg via ORAL
  Filled 2019-08-16: qty 1

## 2019-08-16 NOTE — Progress Notes (Signed)
Physical Therapy Treatment Patient Details Name: Kristina [name] MRN: [id number] DOB: 28-Dec-1931 Today's Date: 08/16/2019    History of Present Illness Pt is 86 y.o. female with medical history significant of advanced dementia, hypertension, BPPV, GERD, hyperlipidemia, hypothyroidism, CKD stage III, presents to ED on 7/28 with near syncope episodes. Pt additionally with elevated troponins, now downtrending and cardiology suspects secondary to hypotension.  Additionally, pt found to have acute DVT and had IVC placed on 08/10/19.  Pt is currently waiting on insurance approval for SNF at d/c    PT Comments    Pt making gradual progress.  Continues to need reinforcement of transfer techniques and safety with gait and transfers.  Pt requiring min A for steadying with mobility. Cont POC.    Follow Up Recommendations  SNF;Supervision/Assistance - 24 hour     Equipment Recommendations  None recommended by PT    Recommendations for Other Services       Precautions / Restrictions Precautions Precautions: Fall    Mobility  Bed Mobility               General bed mobility comments: in chair  Transfers Overall transfer level: Needs assistance Equipment used: Rolling walker (2 wheeled) Transfers: Sit to/from Stand Sit to Stand: Min assist         General transfer comment: Performed sit to stand x 5 with cues for safe hand placement and safe descent  Ambulation/Gait Ambulation/Gait assistance: Min assist Gait Distance (Feet): 150 Feet Assistive device: Rolling walker (2 wheeled) Gait Pattern/deviations: Step-through pattern;Drifts right/left;Trunk flexed Gait velocity: decreased   General Gait Details: Tending to keep RW too far forward requiring frequent cues and assistance for proper distancing.  Assist with RW in tight areas.   Stairs             Wheelchair Mobility    Modified Rankin (Stroke Patients Only)       Balance Overall balance assessment: Needs  assistance Sitting-balance support: No upper extremity supported;Feet supported Sitting balance-Leahy Scale: Fair     Standing balance support: Bilateral upper extremity supported;During functional activity Standing balance-Leahy Scale: Poor Standing balance comment: Pt reliant on RW support and external assist from therapist to maintain dynamic standing balance                            Cognition Arousal/Alertness: Awake/alert Behavior During Therapy: WFL for tasks assessed/performed Overall Cognitive Status: History of cognitive impairments - at baseline                                        Exercises      General Comments General comments (skin integrity, edema, etc.): Pt in chair at arrival.  Orthostatic BP earlier today with OT was negative.  Pt with no syncopal symptoms with PT      Pertinent Vitals/Pain Pain Assessment: No/denies pain    Home Living                      Prior Function            PT Goals (current goals can now be found in the care plan section) Acute Rehab PT Goals Patient Stated Goal: go home PT Goal Formulation: With patient Time For Goal Achievement: 08/23/19 Potential to Achieve Goals: Good Progress towards PT goals: Progressing toward goals  Frequency    Min 2X/week      PT Plan Current plan remains appropriate    Co-evaluation              AM-PAC PT "6 Clicks" Mobility   Outcome Measure  Help needed turning from your back to your side while in a flat bed without using bedrails?: A Little Help needed moving from lying on your back to sitting on the side of a flat bed without using bedrails?: A Little Help needed moving to and from a bed to a chair (including a wheelchair)?: A Little Help needed standing up from a chair using your arms (e.g., wheelchair or bedside chair)?: A Little Help needed to walk in hospital room?: A Little Help needed climbing 3-5 steps with a railing? : A  Lot 6 Click Score: 17    End of Session Equipment Utilized During Treatment: Gait belt Activity Tolerance: Patient tolerated treatment well Patient left: with chair alarm set;in chair;with call bell/phone within reach Nurse Communication: Mobility status PT Visit Diagnosis: Other abnormalities of gait and mobility (R26.89);Muscle weakness (generalized) (M62.81)     Time: 0923-3007 PT Time Calculation (min) (ACUTE ONLY): 17 min  Charges:  $Gait Training: 8-22 mins                     Abran Richard, PT Acute Rehab Services Pager [phone] [facility] 08/16/2019, 2:04 PM

## 2019-08-16 NOTE — Discharge Instructions (Signed)
Information on my medicine - ELIQUIS® (apixaban) ° ° °Why was Eliquis® prescribed for you? °Eliquis® was prescribed to treat blood clots that may have been found in the veins of your legs (deep vein thrombosis) or in your lungs (pulmonary embolism) and to reduce the risk of them occurring again. ° °What do You need to know about Eliquis® ? °The dose is 5 mg tablet taken TWICE daily.  Eliquis® may be taken with or without food.  ° °Try to take the dose about the same time in the morning and in the evening. If you have difficulty swallowing the tablet whole please discuss with your pharmacist how to take the medication safely. ° °Take Eliquis® exactly as prescribed and DO NOT stop taking Eliquis® without talking to the doctor who prescribed the medication.  Stopping may increase your risk of developing a new blood clot.  Refill your prescription before you run out. ° °After discharge, you should have regular check-up appointments with your healthcare provider that is prescribing your Eliquis®. °   °What do you do if you miss a dose? °If a dose of ELIQUIS® is not taken at the scheduled time, take it as soon as possible on the same day and twice-daily administration should be resumed. The dose should not be doubled to make up for a missed dose. ° °Important Safety Information °A possible side effect of Eliquis® is bleeding. You should call your healthcare provider right away if you experience any of the following: °Bleeding from an injury or your nose that does not stop. °Unusual colored urine (red or dark brown) or unusual colored stools (red or black). °Unusual bruising for unknown reasons. °A serious fall or if you hit your head (even if there is no bleeding). ° °Some medicines may interact with Eliquis® and might increase your risk of bleeding or clotting while on Eliquis®. To help avoid this, consult your healthcare provider or pharmacist prior to using any new prescription or non-prescription medications,  including herbals, vitamins, non-steroidal anti-inflammatory drugs (NSAIDs) and supplements. ° °This website has more information on Eliquis® (apixaban): http://www.eliquis.com/eliquis/home ° °

## 2019-08-16 NOTE — Progress Notes (Signed)
PROGRESS NOTE    Kristina [name]  EUM:[id number] DOB: 1931/09/15 DOA: 08/08/2019 PCP: Midge Minium, MD      Brief Narrative:  86 y.o. F with dementia, HTN, hypothyroidism and CKD III who presented with weakness, syncope.  Patient recently admitted for orthostatic symptoms, started on midodrine. Unfortunately went home never started midodrine, and daughter had noted decreased fluid intake.  Leading up to this hospitalization, the patient was complaining of feeling lightheaded, and had several episodes of wobbly gait, finally collapsing without injury. In the ER patient found to have soft blood pressure, tachycardia which improved with IV fluids.        Assessment & Plan:  Orthostatic hypotension causing syncope No active symptoms -Continue abdominal binder  Acute DVT -Transition from Lovenox to apixaban   Dementia -Continue memantine  Elevated troponin   AKI on CKD stage III Baseline GFR 57, creatinine 1.47 on admission, improved to normal with fluids.  Hypothyroidism -Continue levothyroxine  Stage II pressure injury, left buttocks, POA           Disposition: Status is: Inpatient  Remains inpatient appropriate because:Unsafe d/c plan   Dispo: The patient is from: Home              Anticipated d/c is to: SNF              Anticipated d/c date is: 1 day              Patient currently is medically stable to d/c.              MDM: The below labs and imaging reports were reviewed and summarized above.  Medication management as above.    DVT prophylaxis:  apixaban (ELIQUIS) tablet 10 mg  apixaban (ELIQUIS) tablet 5 mg  Code Status: DNR Family Communication: daughter by phone yesterday           Subjective: Patient feels well. No dizziness, syncope. She has generalized weakness. No chest pain, palpitations.  Objective: Vitals:   08/15/19 0748 08/15/19 1649 08/15/19 2047 08/16/19 0725  BP: 122/65 (!) 120/54  117/62 (!) 116/54  Pulse: 85 85 89 75  Resp: 16 19 18 11   Temp: 98 F (36.7 C) 98.5 F (36.9 C) 97.9 F (36.6 C) 98 F (36.7 C)  TempSrc:   Oral Oral  SpO2: 97% 98% 98% 93%  Weight:      Height:        Intake/Output Summary (Last 24 hours) at 08/16/2019 1434 Last data filed at 08/16/2019 1237 Gross per 24 hour  Intake --  Output 1300 ml  Net -1300 ml   Filed Weights   08/11/19 0458 08/12/19 0500 08/14/19 0309  Weight: 66.1 kg 67.2 kg 67 kg    Examination: General appearance: Elderly adult female, alert and in no acute distress.   HEENT: Anicteric, conjunctiva pink, lids and lashes normal. No nasal deformity, discharge, epistaxis.  Lips moist.   Skin: Warm and dry. No jaundice.  No suspicious rashes or lesions. Cardiac: RRR, nl S1-S2, no murmurs appreciated.  Capillary refill is brisk.  JVP not visible.  No LE edema.  Radial pulses 2+ and symmetric. Respiratory: Normal respiratory rate and rhythm.  CTAB without rales or wheezes. Abdomen: Abdomen soft. No TTP or guarding. No ascites, distension, hepatosplenomegaly.   MSK: No deformities or effusions. Neuro: Awake and alert.  EOMI, moves all extremities. Speech fluent.    Psych: Sensorium intact and responding to questions, attention normal. Affect pleasant.  Judgment and  insight appear moderately impaired by dementia.    Data Reviewed: I have personally reviewed following labs and imaging studies:  CBC: Recent Labs  Lab 08/10/19 0138 08/12/19 0157 08/13/19 0119 08/16/19 0456  WBC 11.3* 9.8 9.4 8.7  NEUTROABS  --  6.3  --   --   HGB 10.9* 11.4* 11.0* 11.2*  HCT 33.8* 35.0* 33.9* 34.3*  MCV 99.4 96.7 99.1 97.2  PLT 228 229 215 751   Basic Metabolic Panel: Recent Labs  Lab 08/10/19 0138 08/12/19 0157  NA 146* 138  K 3.5 4.1  CL 110 108  CO2 25 22  GLUCOSE 131* 100*  BUN 26* 17  CREATININE 0.92 0.84  CALCIUM 8.9 8.1*   GFR: Estimated Creatinine Clearance: 41.8 mL/min (by C-G formula based on SCr of 0.84  mg/dL). Liver Function Tests: No results for input(s): AST, ALT, ALKPHOS, BILITOT, PROT, ALBUMIN in the last 168 hours. No results for input(s): LIPASE, AMYLASE in the last 168 hours. No results for input(s): AMMONIA in the last 168 hours. Coagulation Profile: No results for input(s): INR, PROTIME in the last 168 hours. Cardiac Enzymes: No results for input(s): CKTOTAL, CKMB, CKMBINDEX, TROPONINI in the last 168 hours. BNP (last 3 results) No results for input(s): PROBNP in the last 8760 hours. HbA1C: No results for input(s): HGBA1C in the last 72 hours. CBG: Recent Labs  Lab 08/12/19 0521 08/13/19 0556 08/13/19 0817 08/14/19 0809 08/16/19 0726  GLUCAP 86 77 101* 95  95 103*   Lipid Profile: No results for input(s): CHOL, HDL, LDLCALC, TRIG, CHOLHDL, LDLDIRECT in the last 72 hours. Thyroid Function Tests: No results for input(s): TSH, T4TOTAL, FREET4, T3FREE, THYROIDAB in the last 72 hours. Anemia Panel: No results for input(s): VITAMINB12, FOLATE, FERRITIN, TIBC, IRON, RETICCTPCT in the last 72 hours. Urine analysis:    Component Value Date/Time   COLORURINE YELLOW 07/09/2019 1444   APPEARANCEUR HAZY (A) 07/09/2019 1444   LABSPEC 1.025 07/09/2019 1444   PHURINE 5.0 07/09/2019 1444   GLUCOSEU NEGATIVE 07/09/2019 1444   HGBUR NEGATIVE 07/09/2019 1444   BILIRUBINUR NEGATIVE 07/09/2019 1444   KETONESUR NEGATIVE 07/09/2019 1444   PROTEINUR NEGATIVE 07/09/2019 1444   NITRITE NEGATIVE 07/09/2019 1444   LEUKOCYTESUR NEGATIVE 07/09/2019 1444   Sepsis Labs: @LABRCNTIP (procalcitonin:4,lacticacidven:4)  ) Recent Results (from the past 240 hour(s))  SARS Coronavirus 2 by RT PCR     Status: None   Collection Time: 08/08/19  4:36 PM  Result Value Ref Range Status   SARS Coronavirus 2 NEGATIVE NEGATIVE Final    Comment: (NOTE) Result indicates the ABSENCE of SARS-CoV-2 RNA in the patient specimen.   The lowest concentration of SARS-CoV-2 viral copies this assay can detect  in nasopharyngeal swab specimens is 500 copies / mL.  A negative result does not preclude SARS-CoV-2 infection and should not be used as the sole basis for patient management decisions. A negative result may occur with improper specimen collection / handling, submission of a specimen other than nasopharyngeal swab, presence of viral mutation(s) within the areas targeted by this assay, and inadequate number of viral copies (<500 copies / mL) present.  Negative results must be combined with clinical observations, patient history, and epidemiological information.   The expected result is NEGATIVE.   Patient Fact Sheet:  BlogSelections.co.uk    Provider Fact Sheet:  https://lucas.com/    This test is not yet approved or cleared by the [place] FDA and  has been [place] orized for detection and/or diagnosis of SARS-CoV-2 by FDA  under an Emergency Use Authorization (EUA).  This EUA will remain in effect (meaning this test can be used) for the duration of  the COVID-19 declaration under Section 564(b)(1) of the Act, 21 U.S.C. section 360bbb-3(b)(1), unless the authorization is terminated or revoked sooner  Performed at [facility], [place] [address], [place], [place] [address]   SARS Coronavirus 2 by RT PCR (hospital order, performed in [facility] hospital lab) Nasopharyngeal Nasopharyngeal Swab     Status: None   Collection Time: 08/15/19 12:31 PM   Specimen: Nasopharyngeal Swab  Result Value Ref Range Status   SARS Coronavirus 2 NEGATIVE NEGATIVE Final    Comment: (NOTE) SARS-CoV-2 target nucleic acids are NOT DETECTED.  The SARS-CoV-2 RNA is generally detectable in upper and lower respiratory specimens during the acute phase of infection. The lowest concentration of SARS-CoV-2 viral copies this assay can detect is 250 copies / mL. A negative result does not preclude SARS-CoV-2 infection and should not be used as the sole basis  for treatment or other patient management decisions.  A negative result may occur with improper specimen collection / handling, submission of specimen other than nasopharyngeal swab, presence of viral mutation(s) within the areas targeted by this assay, and inadequate number of viral copies (<250 copies / mL). A negative result must be combined with clinical observations, patient history, and epidemiological information.  Fact Sheet for Patients:   StrictlyIdeas.no  Fact Sheet for Healthcare Providers: BankingDealers.co.za  This test is not yet approved or  cleared by the [place] FDA and has been authorized for detection and/or diagnosis of SARS-CoV-2 by FDA under an Emergency Use Authorization (EUA).  This EUA will remain in effect (meaning this test can be used) for the duration of the COVID-19 declaration under Section 564(b)(1) of the Act, 21 U.S.C. section 360bbb-3(b)(1), unless the authorization is terminated or revoked sooner.  Performed at [facility], [place] [address], [place], [place] [address]          Radiology Studies: No results found.      Scheduled Meds: . apixaban  10 mg Oral BID   Followed by  . [START ON 08/17/2019] apixaban  5 mg Oral BID  . feeding supplement (ENSURE ENLIVE)  237 mL Oral TID BM  . levothyroxine  75 mcg Oral QAC breakfast  . memantine  10 mg Oral BID  . multivitamin with minerals  1 tablet Oral Daily  . sodium chloride flush  3 mL Intravenous Q12H   Continuous Infusions:   LOS: 7 days    Time spent: 25 minutes    Edwin Dada, MD [facility] 08/16/2019, 2:34 PM     Please page though Alianza or Epic secure chat:  For [employer], Adult nurse

## 2019-08-16 NOTE — TOC Progression Note (Signed)
Transition of Care [facility]) - Progression Note    Patient Details  Name: Kristina [name] MRN: [id number] Date of Birth: 06-08-1931  Transition of [facility]) CM/SW [place], LCSW Phone Number: 08/16/2019, 9:39 AM  Clinical Narrative:    CSW spoke with [facility] and made sure they received clinicals and faxed OT note. [place] aware we are waiting on Biochem[facility], clinical.    Expected Discharge Plan: [place] Barriers to Discharge: Ship broker  Expected Discharge Plan and Services Expected Discharge Plan: [facility]       Living arrangements for the past 2 months: [place], [place] Expected Discharge Date: 08/15/19                                     Social Determinants of Health (SDOH) Interventions    Readmission Risk Interventions Readmission Risk Prevention Plan 07/10/2019  Post Dischage Appt Not Complete  Appt Comments plan for SNF  Medication Screening Complete  Transportation Screening Complete  PCP or Specialist Appt within 5-7 Days Not Complete  Not Complete comments plan for SNF  Home Care Screening Complete  Medication Review (RN CM) Referral to Pharmacy  Some recent data might be hidden

## 2019-08-16 NOTE — Evaluation (Signed)
Occupational Therapy Evaluation Patient Details Name: Kristina [name] MRN: [id number] DOB: [birth year] Today's Date: 08/16/2019    History of Present Illness 86 y.o. female with medical history significant of advanced dementia, hypertension, BPPV, GERD, hyperlipidemia, hypothyroidism, CKD stage III, presents to ED on 7/28 with near syncope episodes. Pt additionally with elevated troponins, now downtrending and cardiology suspects secondary to hypotension.   Clinical Impression   PTA, pt lives with family and reports Independence with mobility and ADLs without use of AD. Pt presents now with diagnoses above and deficits in strength, standing balance, endurance, cognition, and coordination. Pt requires Mod A for bed mobility and transfers using RW, requiring repetition of instructions and tactile cues to sequence tasks. Pt requires up to Mod A for UB ADLs and Total A for LB ADLs with frequent episodes of urine incontinence during session. Assessed BP during transitional movements (see below), negative for orthostatic hypotension. Recommend DC to SNF for short term rehab to maximize functional abilities.     Follow Up Recommendations  SNF;Supervision/Assistance - 24 hour    Equipment Recommendations  Other (comment) (TBD)    Recommendations for Other Services       Precautions / Restrictions Precautions Precautions: Fall Restrictions Weight Bearing Restrictions: No      Mobility Bed Mobility Overal bed mobility: Needs Assistance Bed Mobility: Supine to Sit     Supine to sit: Mod assist;HOB elevated     General bed mobility comments: Mod A to sit EOB, tactile and verbal cues needed. Assistance needed to advance B LE and trunk to EOB  Transfers Overall transfer level: Needs assistance Equipment used: Rolling walker (2 wheeled) Transfers: Sit to/from [facility] Sit to Stand: Min assist Stand pivot transfers: Mod assist       General transfer comment: Min A  for multipe sit to stand trials, required cues each time for hand placement. Pt Mod A for stand pivot to maintain balance, manuever RW and sequence transfer    Balance Overall balance assessment: Needs assistance Sitting-balance support: No upper extremity supported;Feet supported Sitting balance-Leahy Scale: Fair     Standing balance support: Bilateral upper extremity supported;During functional activity Standing balance-Leahy Scale: Poor Standing balance comment: Pt reliant on RW support and external assist from therapist to maintain dynamic standing balance                           ADL either performed or assessed with clinical judgement   ADL Overall ADL's : Needs assistance/impaired Eating/Feeding: Set up;Sitting   Grooming: Set up;Sitting;Wash/dry face Grooming Details (indicate cue type and reason): Setup to wash face sitting EOB Upper Body Bathing: Moderate assistance;Sitting   Lower Body Bathing: Maximal assistance;Sit to/from stand   Upper Body Dressing : Minimal assistance;Sitting   Lower Body Dressing: Sit to/from stand;Total assistance Lower Body Dressing Details (indicate cue type and reason): Total A to don socks sitting in recliner chair Toilet Transfer: Moderate assistance;Stand-pivot;RW;BSC Toilet Transfer Details (indicate cue type and reason): simulated to recliner Toileting- Clothing Manipulation and Hygiene: Total assistance;Sit to/from stand Toileting - Clothing Manipulation Details (indicate cue type and reason): Total A for cleanup after constant urine incontinence. Pt intermittently saying she can feel when she needs to urinate, then cant feel when she needs to        General ADL Comments: Pt with deficits in cognition, endurance, strength, and standing balance that impact ability to complete ADLs without assistance     Vision Baseline Vision/History:  Wears glasses Wears Glasses: At all times Patient Visual Report: No change from  baseline Vision Assessment?: No apparent visual deficits     Perception     Praxis Praxis Praxis tested?: Deficits Deficits: Organization Praxis-Other Comments: Difficulty problem solving and sequencing simple tasks    Pertinent Vitals/Pain Pain Assessment: No/denies pain     Hand Dominance Right   Extremity/Trunk Assessment Upper Extremity Assessment Upper Extremity Assessment: Generalized weakness;RUE deficits/detail;LUE deficits/detail RUE Deficits / Details: active shoulder ROM to about 110* but functional LUE Deficits / Details: Active shoulder flexion to about 100*, but functional. PROM WFL   Lower Extremity Assessment Lower Extremity Assessment: Defer to PT evaluation   Cervical / Trunk Assessment Cervical / Trunk Assessment: Normal   Communication Communication Communication: HOH   Cognition Arousal/Alertness: Awake/alert Behavior During Therapy: WFL for tasks assessed/performed Overall Cognitive Status: History of cognitive impairments - at baseline Area of Impairment: Safety/judgement;Following commands;Awareness;Problem solving;Memory;Attention;Orientation                 Orientation Level: Person;Time;Situation (could not recall birthday) Current Attention Level: Sustained Memory: Decreased short-term memory;Decreased recall of precautions Following Commands: Follows one step commands with increased time;Follows multi-step commands inconsistently Safety/Judgement: Decreased awareness of deficits;Decreased awareness of safety Awareness: Intellectual Problem Solving: Decreased initiation;Difficulty sequencing;Requires verbal cues;Requires tactile cues;Slow processing General Comments: Dementia at baseline. Can state name (not birthday) and knows she is at [place]. Pt requires repetition of one step commands. Unable to recall plan for session when asked 10 min later. Benefits from multimodal cueing   General Comments  Pt received without nasal  cannula on. SpO2 >94% on RA during session. Pt denied dizziness during transitional movements and BP WFL throughout (111/53 lying, 121/61 sitting, and 119/57 standing)    Exercises     Shoulder Instructions      Home Living Family/patient expects to be discharged to:: Private residence Living Arrangements: Children Available Help at Discharge: Family;Available 24 hours/day Type of Home: House Home Access: Stairs to enter [employer] of Steps: 2 Entrance Stairs-Rails: None Home Layout: One level     Bathroom Shower/Tub: Occupational psychologist: Standard     Home Equipment: Environmental consultant - 4 wheels          Prior Functioning/Environment Level of Independence: Needs assistance  Gait / Transfers Assistance Needed: pt reports walking without AD most of the time, but occasionally needs rollator ADL's / Homemaking Assistance Needed: pt reports independence with ADL tasks            OT Problem List: Decreased strength;Decreased activity tolerance;Impaired balance (sitting and/or standing);Decreased coordination;Decreased cognition;Decreased safety awareness;Decreased knowledge of use of DME or AE;Cardiopulmonary status limiting activity      OT Treatment/Interventions: Self-care/ADL training;Therapeutic exercise;Energy conservation;DME and/or AE instruction;Therapeutic activities;Patient/family education    OT Goals(Current goals can be found in the care plan section) Acute Rehab OT Goals Patient Stated Goal: go home OT Goal Formulation: With patient Time For Goal Achievement: 08/30/19 Potential to Achieve Goals: Good ADL Goals Pt Will Perform Grooming: with min guard assist;standing Pt Will Perform Lower Body Bathing: with min assist;sit to/from stand Pt Will Perform Lower Body Dressing: with min assist;sit to/from stand Pt Will Transfer to Toilet: with min guard assist;stand pivot transfer;bedside commode Pt Will Perform Toileting - Clothing Manipulation  and hygiene: with min assist;sit to/from stand  OT Frequency: Min 2X/week   Barriers to D/C:            Co-evaluation  AM-PAC OT "6 Clicks" Daily Activity     Outcome Measure Help from another person eating meals?: A Little Help from another person taking care of personal grooming?: A Little Help from another person toileting, which includes using toliet, bedpan, or urinal?: Total Help from another person bathing (including washing, rinsing, drying)?: A Lot Help from another person to put on and taking off regular upper body clothing?: A Little Help from another person to put on and taking off regular lower body clothing?: Total 6 Click Score: 13   End of Session Equipment Utilized During Treatment: Gait belt;Rolling walker Nurse Communication: Mobility status  Activity Tolerance: Patient tolerated treatment well Patient left: in chair;with call bell/phone within reach;with chair alarm set  OT Visit Diagnosis: Unsteadiness on feet (R26.81);Other abnormalities of gait and mobility (R26.89);Muscle weakness (generalized) (M62.81);Other symptoms and signs involving cognitive function                Time: 2725-3664 OT Time Calculation (min): 32 min Charges:  OT General Charges $OT Visit: 1 Visit OT Evaluation $OT Eval Moderate Complexity: 1 Mod OT Treatments $Self Care/Home Management : 8-22 mins  Layla Maw, OTR/L  Layla Maw 08/16/2019, 8:56 AM

## 2019-08-17 DIAGNOSIS — J069 Acute upper respiratory infection, unspecified: Secondary | ICD-10-CM | POA: Diagnosis not present

## 2019-08-17 DIAGNOSIS — I129 Hypertensive chronic kidney disease with stage 1 through stage 4 chronic kidney disease, or unspecified chronic kidney disease: Secondary | ICD-10-CM | POA: Diagnosis not present

## 2019-08-17 DIAGNOSIS — E039 Hypothyroidism, unspecified: Secondary | ICD-10-CM | POA: Diagnosis not present

## 2019-08-17 DIAGNOSIS — R4182 Altered mental status, unspecified: Secondary | ICD-10-CM | POA: Diagnosis not present

## 2019-08-17 DIAGNOSIS — N179 Acute kidney failure, unspecified: Secondary | ICD-10-CM | POA: Diagnosis not present

## 2019-08-17 DIAGNOSIS — I951 Orthostatic hypotension: Secondary | ICD-10-CM | POA: Diagnosis not present

## 2019-08-17 DIAGNOSIS — Z7401 Bed confinement status: Secondary | ICD-10-CM | POA: Diagnosis not present

## 2019-08-17 DIAGNOSIS — M6258 Muscle wasting and atrophy, not elsewhere classified, other site: Secondary | ICD-10-CM | POA: Diagnosis not present

## 2019-08-17 DIAGNOSIS — R41841 Cognitive communication deficit: Secondary | ICD-10-CM | POA: Diagnosis not present

## 2019-08-17 DIAGNOSIS — R627 Adult failure to thrive: Secondary | ICD-10-CM | POA: Diagnosis not present

## 2019-08-17 DIAGNOSIS — F0390 Unspecified dementia without behavioral disturbance: Secondary | ICD-10-CM | POA: Diagnosis not present

## 2019-08-17 DIAGNOSIS — R488 Other symbolic dysfunctions: Secondary | ICD-10-CM | POA: Diagnosis not present

## 2019-08-17 DIAGNOSIS — D649 Anemia, unspecified: Secondary | ICD-10-CM | POA: Diagnosis not present

## 2019-08-17 DIAGNOSIS — I82441 Acute embolism and thrombosis of right tibial vein: Secondary | ICD-10-CM | POA: Diagnosis not present

## 2019-08-17 DIAGNOSIS — I82431 Acute embolism and thrombosis of right popliteal vein: Secondary | ICD-10-CM | POA: Diagnosis not present

## 2019-08-17 DIAGNOSIS — I82451 Acute embolism and thrombosis of right peroneal vein: Secondary | ICD-10-CM | POA: Diagnosis not present

## 2019-08-17 DIAGNOSIS — M2550 Pain in unspecified joint: Secondary | ICD-10-CM | POA: Diagnosis not present

## 2019-08-17 DIAGNOSIS — G629 Polyneuropathy, unspecified: Secondary | ICD-10-CM | POA: Diagnosis not present

## 2019-08-17 DIAGNOSIS — R2681 Unsteadiness on feet: Secondary | ICD-10-CM | POA: Diagnosis not present

## 2019-08-17 DIAGNOSIS — R410 Disorientation, unspecified: Secondary | ICD-10-CM | POA: Diagnosis not present

## 2019-08-17 DIAGNOSIS — R1312 Dysphagia, oropharyngeal phase: Secondary | ICD-10-CM | POA: Diagnosis not present

## 2019-08-17 DIAGNOSIS — N1831 Chronic kidney disease, stage 3a: Secondary | ICD-10-CM | POA: Diagnosis not present

## 2019-08-17 DIAGNOSIS — I824Y2 Acute embolism and thrombosis of unspecified deep veins of left proximal lower extremity: Secondary | ICD-10-CM | POA: Diagnosis not present

## 2019-08-17 DIAGNOSIS — R2689 Other abnormalities of gait and mobility: Secondary | ICD-10-CM | POA: Diagnosis not present

## 2019-08-17 DIAGNOSIS — I82491 Acute embolism and thrombosis of other specified deep vein of right lower extremity: Secondary | ICD-10-CM | POA: Diagnosis not present

## 2019-08-17 DIAGNOSIS — I10 Essential (primary) hypertension: Secondary | ICD-10-CM | POA: Diagnosis not present

## 2019-08-17 DIAGNOSIS — R55 Syncope and collapse: Secondary | ICD-10-CM | POA: Diagnosis not present

## 2019-08-17 DIAGNOSIS — I82411 Acute embolism and thrombosis of right femoral vein: Secondary | ICD-10-CM | POA: Diagnosis not present

## 2019-08-17 DIAGNOSIS — M6281 Muscle weakness (generalized): Secondary | ICD-10-CM | POA: Diagnosis not present

## 2019-08-17 LAB — GLUCOSE, CAPILLARY: Glucose-Capillary: 111 mg/dL — ABNORMAL HIGH (ref 70–99)

## 2019-08-17 NOTE — TOC Transition Note (Signed)
Transition of Care [facility]) - CM/SW Discharge Note   Patient Details  Name: Kristina [name] MRN: [id number] Date of Birth: June 25, 1931  Transition [facility] CM/SW Contact:  Benard Halsted, [place] Phone Number: 08/17/2019, 9:33 AM   Clinical Narrative:    Patient will DC to: [facility] Anticipated DC date: 08/17/19 Family notified: Daughter, Armed forces technical officer by: Corey Harold   Per MD patient ready for DC to [facility]. RN, patient, patient's family, and facility notified of DC. Discharge Summary and FL2 sent to facility. RN to call report prior to discharge [phone]). DC packet on chart. Ambulance transport requested for patient.   CSW will sign off for now as social work intervention is no longer needed. Please consult [place] again if new needs arise.      Final next level of care: Skilled Nursing Facility Barriers to Discharge: Barriers Resolved   Patient Goals and CMS Choice Patient states their goals for this hospitalization and ongoing recovery are:: To get better CMS Medicare.gov Compare Post Acute Care list provided to:: Patient Choice offered to / list presented to : Patient  Discharge Placement   Existing PASRR number confirmed : 08/17/19          Patient [facility] bed at: [facility] Patient to be transferred to facility by: [place] Name of family member notified: [name], daughter Patient and family notified of of transfer: 08/17/19  Discharge Plan and Services                                     Social Determinants of Health (SDOH) Interventions     Readmission Risk Interventions Readmission Risk Prevention Plan 07/10/2019  Post Dischage Appt Not Complete  Appt Comments plan for SNF  Medication Screening Complete  Transportation Screening Complete  PCP or Specialist Appt within 5-7 Days Not Complete  Not Complete comments plan for SNF  Home Care Screening Complete  Medication Review (RN CM) Referral to Pharmacy  Some recent data  might be hidden

## 2019-08-17 NOTE — TOC Progression Note (Signed)
Transition of Care [facility]) - Progression Note    Patient Details  Name: Kristina [name] MRN: [id number] Date of Birth: 1931/10/12  Transition of Care [facility]) CM/SW [place], [place] Phone Number: 08/17/2019, 9:25 AM  Clinical Narrative:    CSW received insurance approval for patient to discharge to [facility] today: #[id number], next review due 8/10. CSW spoke with patient's daughter, [name], to make her aware. She requests [facility] for transport and for her sister, Joelene Millin, to complete paperwork at [facility]. She is aware that patient may be in out of pocket copays for the rehab stay until it transitions to long term care. CSW made [facility] aware.   Expected Discharge Plan: [place] Barriers to Discharge: Barriers Resolved  Expected Discharge Plan and Services Expected Discharge Plan: [place]       Living arrangements for the past 2 months: [place], [place] Expected Discharge Date: 08/17/19                                     Social Determinants of Health (SDOH) Interventions    Readmission Risk Interventions Readmission Risk Prevention Plan 07/10/2019  Post Dischage Appt Not Complete  Appt Comments plan for SNF  Medication Screening Complete  Transportation Screening Complete  PCP or Specialist Appt within 5-7 Days Not Complete  Not Complete comments plan for SNF  Home Care Screening Complete  Medication Review (RN CM) Referral to Pharmacy  Some recent data might be hidden

## 2019-08-17 NOTE — Discharge Summary (Signed)
Physician Discharge Summary  Kristina [name] VFI:[id number] DOB: 1931/11/24 DOA: 08/08/2019  PCP: Midge Minium, MD  Admit date: 08/08/2019 Discharge date: 08/17/2019  Admitted From: Home  Disposition:  SNF   Recommendations for Outpatient Follow-up:  1. Follow up with PCP in 1 week after discharge from SNF 2. Please obtain BMP/CBC in one week     Home Health: N/A  Equipment/Devices: TBD at SNF  Discharge Condition: Fair  CODE STATUS: DNR Diet recommendation: Regular  Brief/Interim Summary: Kristina [name] is an 86 y.o. F with dementia, HTN, hypothyroidism and CKD III who presented with weakness, syncope.  Patient recently admitted for orthostatic symptoms, started on midodrine. Unfortunately went home never started midodrine, and daughter had noted decreased fluid intake.  Leading up to this hospitalization, the patient was complaining of feeling lightheaded, and had several episodes of wobbly gait, finally collapsing without injury. In the ER patient found to have soft blood pressure, tachycardia which improved with IV fluids.        PRINCIPAL HOSPITAL DIAGNOSIS: Orthostatic hypotension    Discharge Diagnoses:   Orthostatic hypotension causing syncope Patient admitted, given fluids. She remained orthostatic, cardiology were consulted. She was started on an abdominal binder. Midodrine was considered, but her symptoms seem to improve with abdominal binder alone. -Monitor symptoms, if recurrent orthostasis, consider cautious initiation of midodrine  Acute DVT The patient was noted to have leg swelling. Ultrasound confirmed acute left lower extremity DVT. Patient was started on Lovenox and completed 5 days. Due to risk of long-term anticoagulation, an IVC filter was placed. This is planned to be an permanent filter.  After discussion of benefit and risk with daughter, short-term anticoagulation for 3 months with apixaban was recommended. -Take apixaban for 3 months,  stop November 1   Dementia  Elevated troponin Cardiology were consulted, the patient was asymptomatic, her troponin peaked at 654, thought to be from demand ischemia, not a candidate for revascularization, ACS was ruled out   AKI on CKD stage IIIa Baseline GFR 57, creatinine 1.47 on admission, improved to normal with fluids.  Hypothyroidism    Discharge Instructions  Discharge Instructions    Diet - low sodium heart healthy   Complete by: As directed    Discharge instructions   Complete by: As directed    From Dr. Loleta Books: You were admitted for passing out. This was from orthostatic hypotension You should use the abdominal binder to prevent this. If the binder does not help, your doctor at [facility] should start midodrine  Do not lie down for 2 hours after taking midodrine  While you were here, you were also diagnosed with a blood clot in the leg, DVT. Take apixaban/Eliquis 5 mg twice daily for 3 months and stop at the end of October   Go see your primary care doctor within 1 week after moving from rehab to long term care   Discharge wound care:   Complete by: As directed    Apply foam dressing to wound   Discharge wound care:   Complete by: As directed    Cover with foam dressing   Increase activity slowly   Complete by: As directed      Allergies as of 08/17/2019      Reactions   Bee Venom Shortness Of Breath, Itching, Rash, Other (See Comments)   Almost passed out, also   Penicillins Rash   Has patient had a PCN reaction causing immediate rash, facial/tongue/throat swelling, SOB or lightheadedness with hypotension: Yes Has patient  had a PCN reaction causing severe rash involving mucus membranes or skin necrosis: No Has patient had a PCN reaction that required hospitalization: Unknown Has patient had a PCN reaction occurring within the last 10 years: No If all of the above answers are "NO", then may proceed with Cephalosporin use.      Medication List     STOP taking these medications   midodrine 2.5 MG tablet Commonly known as: PROAMATINE     TAKE these medications   apixaban 5 MG Tabs tablet Commonly known as: ELIQUIS Take 1 tablet (5 mg total) by mouth 2 (two) times daily.   [employer] D3 50 MCG (2000 UT) Caps Generic drug: Cholecalciferol TAKE 1 CAPSULE DAILY   diphenhydrAMINE 25 MG tablet Commonly known as: BENADRYL Take 2 tablets (50 mg total) by mouth every 6 (six) hours as needed for itching (or hives).   EPINEPHrine 0.3 mg/0.3 mL Soaj injection Commonly known as: EPI-PEN Inject 0.3 mLs (0.3 mg total) into the muscle once as needed for up to 1 dose (severe allergic reaction).   feeding supplement (ENSURE ENLIVE) Liqd Take 237 mLs by mouth 2 (two) times daily between meals.   fluticasone 50 MCG/ACT nasal spray Commonly known as: FLONASE Place 2 sprays into both nostrils daily as needed (for seasonal allergies). SPRAY 2 SPRAYS INTO EACH NOSTRIL EVERY DAY   levothyroxine 75 MCG tablet Commonly known as: SYNTHROID TAKE 1 TABLET BY MOUTH EVERY DAY What changed: when to take this   loperamide 2 MG capsule Commonly known as: IMODIUM Take 2 mg by mouth as needed for diarrhea or loose stools.   memantine 10 MG tablet Commonly known as: NAMENDA TAKE 1 TABLET BY MOUTH TWICE A DAY. Please call [phone] to scheduled a yearly appt or may also request refills from PCP. What changed:   how much to take  how to take this  when to take this   multivitamin with minerals Tabs tablet Take 1 tablet by mouth daily.            Discharge Care Instructions  (From admission, onward)         Start     Ordered   08/17/19 0000  Discharge wound care:       Comments: Cover with foam dressing   08/17/19 0858   08/15/19 0000  Discharge wound care:       Comments: Apply foam dressing to wound   08/15/19 1606          Contact information for follow-up providers    Midge Minium, MD Follow up.   Specialty: Family  Medicine Contact information: [address] [place] [address] [phone]            Contact information for after-discharge care    Destination    [facility] Preferred SNF .   Service: Skilled Nursing Contact information: [place] [phone]                 Allergies  Allergen Reactions  . Bee Venom Shortness Of Breath, Itching, Rash and Other (See Comments)    Almost passed out, also  . Penicillins Rash    Has patient had a PCN reaction causing immediate rash, facial/tongue/throat swelling, SOB or lightheadedness with hypotension: Yes Has patient had a PCN reaction causing severe rash involving mucus membranes or skin necrosis: No Has patient had a PCN reaction that required hospitalization: Unknown Has patient had a  PCN reaction occurring within the last 10 years: No If all of the above answers are "NO", then may proceed with Cephalosporin use.     Consultations:  Cardiology  IR   Procedures/Studies: IR IVC FILTER PLMT / S&I Burke Keels GUID/MOD SED  Result Date: 08/10/2019 INDICATION: Lower extremity DVT. Poor candidate for anticoagulation (dementia with frequent falls). Request made for IVC filter placement for caval interruption purposes. Given patient's advanced age and multiple medical comorbidities, the IVC filter will be considered a permanent device in the patient not be actively followed by the interventional radiology service for retrieval. EXAM: ULTRASOUND GUIDANCE FOR VASCULAR ACCESS IVC CATHETERIZATION AND VENOGRAM IVC FILTER INSERTION COMPARISON:  CT abdomen and pelvis-11/20/2010 MEDICATIONS: None. ANESTHESIA/SEDATION: Fentanyl 25 mcg IV; Versed 0.5 mg IV Sedation Time: 11 minutes; The patient was continuously monitored during the procedure by the interventional radiology nurse under my direct supervision. CONTRAST:  35 mL OMNIPAQUE IOHEXOL 300 MG/ML  SOLN FLUOROSCOPY TIME:  1 minute,  12 seconds (26 mGy) COMPLICATIONS: None immediate PROCEDURE: Informed consent was obtained from the patient's daughter following explanation of the procedure, risks, benefits and alternatives. The patient understands, agrees and consents for the procedure. All questions were addressed. A time out was performed prior to the initiation of the procedure. Maximal barrier sterile technique utilized including caps, mask, sterile gowns, sterile gloves, large sterile drape, hand hygiene, and Betadine prep. Under sterile condition and local anesthesia, right internal jugular venous access was performed with ultrasound. An ultrasound image was saved and sent to PACS. Over a guidewire, the IVC filter delivery sheath and inner dilator were advanced into the IVC just above the IVC bifurcation. Contrast injection was performed for an IVC venogram. Through the delivery sheath, a retrievable Denali IVC filter was deployed below the level of the renal veins and above the IVC bifurcation. Limited post deployment venacavagram was performed. The delivery sheath was removed and hemostasis was obtained with manual compression. A dressing was placed. The patient tolerated the procedure well without immediate post procedural complication. FINDINGS: The IVC is patent. No evidence of thrombus, stenosis, or occlusion. No variant venous anatomy. Successful placement of the IVC filter below the level of the renal veins. IMPRESSION: Successful ultrasound and fluoroscopically guided placement of an infrarenal retrievable IVC filter via right jugular approach. PLAN: Due to patient related comorbidities and/or clinical necessity, this IVC filter should be considered a permanent device. This patient will not be actively followed for future filter retrieval. Electronically Signed   By: Sandi Mariscal M.D.   On: 08/10/2019 16:08   DG Chest Port 1 View  Result Date: 08/08/2019 CLINICAL DATA:  Syncope. EXAM: PORTABLE CHEST 1 VIEW COMPARISON:  July 15, 2018. FINDINGS: The heart size and mediastinal contours are within normal limits. Both lungs are clear. No pneumothorax or pleural effusion is noted. The visualized skeletal structures are unremarkable. IMPRESSION: No active disease. Electronically Signed   By: Marijo Conception M.D.   On: 08/08/2019 13:55   VAS [place] LOWER EXTREMITY VENOUS (DVT)  Result Date: 08/11/2019  Lower Venous DVTStudy Indications: Edema.  Comparison Study: No prior study on file Performing Technologist: Sharion Dove RVS  Examination Guidelines: A complete evaluation includes B-mode imaging, spectral Doppler, color Doppler, and power Doppler as needed of all accessible portions of each vessel. Bilateral testing is considered an integral part of a complete examination. Limited examinations for reoccurring indications may be performed as noted. The reflux portion of the exam is performed with the patient in reverse Trendelenburg.  +---------+---------------+---------+-----------+----------+--------------+  RIGHT    CompressibilityPhasicitySpontaneityPropertiesThrombus Aging +---------+---------------+---------+-----------+----------+--------------+ CFV      Full           Yes      Yes                                 +---------+---------------+---------+-----------+----------+--------------+ SFJ      Full                                                        +---------+---------------+---------+-----------+----------+--------------+ FV Prox  None                                         Acute          +---------+---------------+---------+-----------+----------+--------------+ FV Mid   None                                         Acute          +---------+---------------+---------+-----------+----------+--------------+ FV DistalNone                                         Acute          +---------+---------------+---------+-----------+----------+--------------+ PFV      Full                                                         +---------+---------------+---------+-----------+----------+--------------+ POP      None           No       No                   Acute          +---------+---------------+---------+-----------+----------+--------------+ PTV      None                                         Acute          +---------+---------------+---------+-----------+----------+--------------+ PERO     None                                         Acute          +---------+---------------+---------+-----------+----------+--------------+   +---------+---------------+---------+-----------+----------+--------------+ LEFT     CompressibilityPhasicitySpontaneityPropertiesThrombus Aging +---------+---------------+---------+-----------+----------+--------------+ CFV      Full           Yes      Yes                                 +---------+---------------+---------+-----------+----------+--------------+ SFJ      Full                                                        +---------+---------------+---------+-----------+----------+--------------+  FV Prox  Full                                                        +---------+---------------+---------+-----------+----------+--------------+ FV Mid   Full                                                        +---------+---------------+---------+-----------+----------+--------------+ FV DistalFull                                                        +---------+---------------+---------+-----------+----------+--------------+ PFV      Full                                                        +---------+---------------+---------+-----------+----------+--------------+ POP      Full           Yes      Yes                                 +---------+---------------+---------+-----------+----------+--------------+ PTV      Full                                                         +---------+---------------+---------+-----------+----------+--------------+ PERO     Full                                                        +---------+---------------+---------+-----------+----------+--------------+     Summary: RIGHT: - Findings consistent with acute deep vein thrombosis involving the right femoral vein, right popliteal vein, right posterior tibial veins, and right peroneal veins.  LEFT: - There is no evidence of deep vein thrombosis in the lower extremity.  *See table(s) above for measurements and observations. Electronically signed by Deitra Mayo MD on 08/11/2019 at 8:54:29 AM.    Final       Subjective: Patient without complaints today.  Slept well, no palpitations, chest pain, dizziness, confusion.  No fevers or seizures.  Discharge Exam: Vitals:   08/16/19 2157 08/17/19 0743  BP: 133/61 (!) 112/43  Pulse: 93 79  Resp: 20 14  Temp: 98.5 F (36.9 C) 97.6 F (36.4 C)  SpO2: 95% 94%   Vitals:   08/16/19 1555 08/16/19 2157 08/17/19 0306 08/17/19 0743  BP: (!) 144/65 133/61  (!) 112/43  Pulse: 99 93  79  Resp: 14 20  14   Temp: 98.2 F (36.8 C) 98.5 F (36.9 C)  97.6  F (36.4 C)  TempSrc:  Oral    SpO2: 95% 95%  94%  Weight:   67.5 kg   Height:        General: Patient resting in bed, no acute distress, interactive and pleasant.   Cardiovascular: RRR, no murmurs, no lower extremity edema.   Respiratory: Respiratory rate and effort normal, lungs clear without rales or wheezes Abdominal: Abdomen soft and nontender, no distention or hepatosplenomegaly or ascites. Neuro/Psych: Patient strength is symmetric in upper and lower extremities, but noted generalized weakness.  Judgment and insight appear moderately impaired due to dementia.   The results of significant diagnostics from this hospitalization (including imaging, microbiology, ancillary and laboratory) are listed below for reference.     Microbiology: Recent Results (from the past 240  hour(s))  SARS Coronavirus 2 by RT PCR     Status: None   Collection Time: 08/08/19  4:36 PM  Result Value Ref Range Status   SARS Coronavirus 2 NEGATIVE NEGATIVE Final    Comment: (NOTE) Result indicates the ABSENCE of SARS-CoV-2 RNA in the patient specimen.   The lowest concentration of SARS-CoV-2 viral copies this assay can detect in nasopharyngeal swab specimens is 500 copies / mL.  A negative result does not preclude SARS-CoV-2 infection and should not be used as the sole basis for patient management decisions. A negative result may occur with improper specimen collection / handling, submission of a specimen other than nasopharyngeal swab, presence of viral mutation(s) within the areas targeted by this assay, and inadequate number of viral copies (<500 copies / mL) present.  Negative results must be combined with clinical observations, patient history, and epidemiological information.   The expected result is NEGATIVE.   Patient Fact Sheet:  BlogSelections.co.uk    Provider Fact Sheet:  https://lucas.com/    This test is not yet approved or cleared by the [place] FDA and  has been [place] orized for detection and/or diagnosis of SARS-CoV-2 by FDA under an Emergency Use Authorization (EUA).  This EUA will remain in effect (meaning this test can be used) for the duration of  the COVID-19 declaration under Section 564(b)(1) of the Act, 21 U.S.C. section 360bbb-3(b)(1), unless the authorization is terminated or revoked sooner  Performed at [facility], [place] [address], [place], [place] [address]   SARS Coronavirus 2 by RT PCR (hospital order, performed in [facility] hospital lab) Nasopharyngeal Nasopharyngeal Swab     Status: None   Collection Time: 08/15/19 12:31 PM   Specimen: Nasopharyngeal Swab  Result Value Ref Range Status   SARS Coronavirus 2 NEGATIVE NEGATIVE Final    Comment: (NOTE) SARS-CoV-2 target nucleic  acids are NOT DETECTED.  The SARS-CoV-2 RNA is generally detectable in upper and lower respiratory specimens during the acute phase of infection. The lowest concentration of SARS-CoV-2 viral copies this assay can detect is 250 copies / mL. A negative result does not preclude SARS-CoV-2 infection and should not be used as the sole basis for treatment or other patient management decisions.  A negative result may occur with improper specimen collection / handling, submission of specimen other than nasopharyngeal swab, presence of viral mutation(s) within the areas targeted by this assay, and inadequate number of viral copies (<250 copies / mL). A negative result must be combined with clinical observations, patient history, and epidemiological information.  Fact Sheet for Patients:   StrictlyIdeas.no  Fact Sheet for Healthcare Providers: BankingDealers.co.za  This test is not yet approved or  cleared by the  [place] Architectural technologist and has been authorized for detection and/or diagnosis of SARS-CoV-2 by FDA under an Print production planner (EUA).  This EUA will remain in effect (meaning this test can be used) for the duration of the COVID-19 declaration under Section 564(b)(1) of the Act, 21 U.S.C. section 360bbb-3(b)(1), unless the authorization is terminated or revoked sooner.  Performed at [facility], [place] [address], [place], [place] [address]      Labs: BNP (last 3 results) Recent Labs    08/08/19 1728  BNP 36.6   Basic Metabolic Panel: Recent Labs  Lab 08/12/19 0157  NA 138  K 4.1  CL 108  CO2 22  GLUCOSE 100*  BUN 17  CREATININE 0.84  CALCIUM 8.1*   Liver Function Tests: No results for input(s): AST, ALT, ALKPHOS, BILITOT, PROT, ALBUMIN in the last 168 hours. No results for input(s): LIPASE, AMYLASE in the last 168 hours. No results for input(s): AMMONIA in the last 168 hours. CBC: Recent Labs  Lab  08/12/19 0157 08/13/19 0119 08/16/19 0456  WBC 9.8 9.4 8.7  NEUTROABS 6.3  --   --   HGB 11.4* 11.0* 11.2*  HCT 35.0* 33.9* 34.3*  MCV 96.7 99.1 97.2  PLT 229 215 340   Cardiac Enzymes: No results for input(s): CKTOTAL, CKMB, CKMBINDEX, TROPONINI in the last 168 hours. BNP: Invalid input(s): POCBNP CBG: Recent Labs  Lab 08/13/19 0556 08/13/19 0817 08/14/19 0809 08/16/19 0726 08/17/19 0734  GLUCAP 77 101* 95  95 103* 111*   D-Dimer No results for input(s): DDIMER in the last 72 hours. Hgb A1c No results for input(s): HGBA1C in the last 72 hours. Lipid Profile No results for input(s): CHOL, HDL, LDLCALC, TRIG, CHOLHDL, LDLDIRECT in the last 72 hours. Thyroid function studies No results for input(s): TSH, T4TOTAL, T3FREE, THYROIDAB in the last 72 hours.  Invalid input(s): FREET3 Anemia work up No results for input(s): VITAMINB12, FOLATE, FERRITIN, TIBC, IRON, RETICCTPCT in the last 72 hours. Urinalysis    Component Value Date/Time   COLORURINE YELLOW 07/09/2019 1444   APPEARANCEUR HAZY (A) 07/09/2019 1444   LABSPEC 1.025 07/09/2019 1444   PHURINE 5.0 07/09/2019 1444   GLUCOSEU NEGATIVE 07/09/2019 1444   HGBUR NEGATIVE 07/09/2019 1444   BILIRUBINUR NEGATIVE 07/09/2019 1444   KETONESUR NEGATIVE 07/09/2019 1444   PROTEINUR NEGATIVE 07/09/2019 1444   NITRITE NEGATIVE 07/09/2019 1444   LEUKOCYTESUR NEGATIVE 07/09/2019 1444   Sepsis Labs Invalid input(s): PROCALCITONIN,  WBC,  LACTICIDVEN Microbiology Recent Results (from the past 240 hour(s))  SARS Coronavirus 2 by RT PCR     Status: None   Collection Time: 08/08/19  4:36 PM  Result Value Ref Range Status   SARS Coronavirus 2 NEGATIVE NEGATIVE Final    Comment: (NOTE) Result indicates the ABSENCE of SARS-CoV-2 RNA in the patient specimen.   The lowest concentration of SARS-CoV-2 viral copies this assay can detect in nasopharyngeal swab specimens is 500 copies / mL.  A negative result does not preclude  SARS-CoV-2 infection and should not be used as the sole basis for patient management decisions. A negative result may occur with improper specimen collection / handling, submission of a specimen other than nasopharyngeal swab, presence of viral mutation(s) within the areas targeted by this assay, and inadequate number of viral copies (<500 copies / mL) present.  Negative results must be combined with clinical observations, patient history, and epidemiological information.   The expected result is NEGATIVE.   Patient Fact Sheet:  BlogSelections.co.uk  Provider Fact Sheet:  https://lucas.com/    This test is not yet approved or cleared by the [place] and  has been [place] orized for detection and/or diagnosis of SARS-CoV-2 by FDA under an Emergency Use Authorization (EUA).  This EUA will remain in effect (meaning this test can be used) for the duration of  the COVID-19 declaration under Section 564(b)(1) of the Act, 21 U.S.C. section 360bbb-3(b)(1), unless the authorization is terminated or revoked sooner  Performed at [facility], [place] [address], [place], [place] [address]   SARS Coronavirus 2 by RT PCR (hospital order, performed in [facility] hospital lab) Nasopharyngeal Nasopharyngeal Swab     Status: None   Collection Time: 08/15/19 12:31 PM   Specimen: Nasopharyngeal Swab  Result Value Ref Range Status   SARS Coronavirus 2 NEGATIVE NEGATIVE Final    Comment: (NOTE) SARS-CoV-2 target nucleic acids are NOT DETECTED.  The SARS-CoV-2 RNA is generally detectable in upper and lower respiratory specimens during the acute phase of infection. The lowest concentration of SARS-CoV-2 viral copies this assay can detect is 250 copies / mL. A negative result does not preclude SARS-CoV-2 infection and should not be used as the sole basis for treatment or other patient management decisions.  A negative result may occur  with improper specimen collection / handling, submission of specimen other than nasopharyngeal swab, presence of viral mutation(s) within the areas targeted by this assay, and inadequate number of viral copies (<250 copies / mL). A negative result must be combined with clinical observations, patient history, and epidemiological information.  Fact Sheet for Patients:   StrictlyIdeas.no  Fact Sheet for Healthcare Providers: BankingDealers.co.za  This test is not yet approved or  cleared by the [place] FDA and has been authorized for detection and/or diagnosis of SARS-CoV-2 by FDA under an Emergency Use Authorization (EUA).  This EUA will remain in effect (meaning this test can be used) for the duration of the COVID-19 declaration under Section 564(b)(1) of the Act, 21 U.S.C. section 360bbb-3(b)(1), unless the authorization is terminated or revoked sooner.  Performed at [facility], [place] [address], [place], [place] [address]      Time coordinating discharge: 25 minutes       SIGNED:   Edwin Dada, MD  [facility] 08/17/2019, 8:59 AM

## 2019-08-21 ENCOUNTER — Encounter: Payer: Self-pay | Admitting: Internal Medicine

## 2019-08-21 ENCOUNTER — Non-Acute Institutional Stay (SKILLED_NURSING_FACILITY): Payer: Medicare Other | Admitting: Internal Medicine

## 2019-08-21 DIAGNOSIS — I951 Orthostatic hypotension: Secondary | ICD-10-CM | POA: Diagnosis not present

## 2019-08-21 DIAGNOSIS — R531 Weakness: Secondary | ICD-10-CM

## 2019-08-21 DIAGNOSIS — I82491 Acute embolism and thrombosis of other specified deep vein of right lower extremity: Secondary | ICD-10-CM

## 2019-08-21 DIAGNOSIS — F0390 Unspecified dementia without behavioral disturbance: Secondary | ICD-10-CM

## 2019-08-21 DIAGNOSIS — E860 Dehydration: Secondary | ICD-10-CM

## 2019-08-21 DIAGNOSIS — N179 Acute kidney failure, unspecified: Secondary | ICD-10-CM

## 2019-08-21 DIAGNOSIS — L89303 Pressure ulcer of unspecified buttock, stage 3: Secondary | ICD-10-CM

## 2019-08-21 DIAGNOSIS — E039 Hypothyroidism, unspecified: Secondary | ICD-10-CM

## 2019-08-21 DIAGNOSIS — R627 Adult failure to thrive: Secondary | ICD-10-CM

## 2019-08-21 DIAGNOSIS — N1832 Chronic kidney disease, stage 3b: Secondary | ICD-10-CM

## 2019-08-21 NOTE — Progress Notes (Signed)
Provider:  Rexene Edison. Mariea Clonts, D.O., C.M.D. Location:  [place] Room Number: [place]:  SNF (31)  PCP: Midge Minium, MD Patient Care Team: Midge Minium, MD as PCP - General (Family Medicine) Marcial Pacas, MD as Consulting Physician (Neurology) Ladene Artist, MD as Consulting Physician (Gastroenterology) Madelin Rear, [facility] as Pharmacist (Pharmacist)  Extended Emergency Contact Information Primary Emergency Contact: Tia Masker of [place] Phone: [phone] Mobile Phone: [phone] Relation: Daughter Secondary Emergency Contact: Nuala [place] of [place] Phone: [phone] Mobile Phone: [phone] Relation: Son  Code Status: full code Goals of Care: Advanced Directive information Advanced Directives 08/21/2019  Does Patient Have a Medical Advance Directive? Yes  Type of Advance Directive (No Data)  Does patient want to make changes to medical advance directive? No - Patient declined  Copy of [place] in Chart? -  Would patient like information on creating a medical advance directive? -   Chief Complaint  Patient presents with  . New Admit To SNF    New Admit     HPI: Patient is a 86 y.o. female seen today for admission to [facility] status post hospitalization from July 28 to August 6 at [facility].  Ms. Graylon Good has a history of advanced dementia, hypertension, hyperlipidemia, hypothyroidism, chronic kidney disease stage III, BPPV, duodenal ulcer and GERD, peripheral neuropathy, and endometrial cancer.  She presented to the emergency department on July 28 with weakness and a fall.  She was admitted about 4 weeks ago for similar symptoms.  At that time she was found to have orthostatic hypotension and midodrine was started.  Unfortunately, she never took this.  Her daughter notes that she only eats and drinks with close monitoring and reminding which  it sounds like was not occurring consistently.  Patient was feeling lightheaded and had multiple episodes of wobbly gait.  During the last episode she collapsed and her daughter was able to lower her down so she had no significant injury.  When EMS arrived she was tachycardic and hypotensive.  In the emergency department hypotension and tachycardia persisted but her heart rate and blood pressure improved with 5 L of IV fluids.  Urinalysis was performed.  Her CMP showed stable chronic kidney disease.  She was admitted for syncope and also given IV albumin.  PT evaluated her and recommended SNF.  She was taken off midodrine.  It was determined that her orthostatic hypotension had indeed caused her syncope.  This improved with fluids and she was started on an abdominal binder.  I opted to keep her off of midodrine.  She had positive troponins which were felt to be due to demand ischemia from tachycardia.  She is not a candidate for revascularization.  Acute coronary syndrome was ruled out.  She did have leukocytosis.  Her chest x-ray was negative for infiltrates lactic acid was checked.  She was monitored off antibiotics.  UA was ordered as above  She was noted to have right lower extremity swelling and ultrasound of her right lower extremity confirmed an acute DVT.  She was started on Lovenox and completed 5 days.  Due to risk of GI bleeding and falls with her syncope, an IVC filter was placed.  Radiology requested that this be permanent given her overall condition.  Short-term apixaban was also recommended for 3 months through the end of October.   Past Medical History:  Diagnosis Date  .  Benign paroxysmal positional vertigo   . Diverticulosis   . Duodenal ulcer   . Endometrial cancer [facility])    age 30  . GERD (gastroesophageal reflux disease)   . Hypertension   . Hypothyroidism   . Memory loss   . Peripheral neuropathy   . Tubulovillous adenoma polyp of colon 04/1998   Past Surgical History:    Procedure Laterality Date  . CATARACT EXTRACTION    . CHOLECYSTECTOMY    . FOOT SURGERY     Right  . IR IVC FILTER PLMT / S&I /IMG GUID/MOD SED  08/10/2019  . KNEE SURGERY     Right  . VAGINAL HYSTERECTOMY      Social History   Socioeconomic History  . Marital status: Married    Spouse name: Not on file  . Number of children: 3  . Years of education: 79  . Highest education level: Not on file  Occupational History  . Occupation: Retired  Tobacco Use  . Smoking status: Never Smoker  . Smokeless tobacco: Never Used  Vaping Use  . Vaping Use: Never used  Substance and Sexual Activity  . Alcohol use: No    Alcohol/week: 0.0 standard drinks  . Drug use: No  . Sexual activity: Not Currently  Other Topics Concern  . Not on file  Social History Narrative   Lives with her daughter, [name].   Widowed.   Retired.   Right hand.   Three children.   High school education.   2 cups coffee daily.   Social Determinants of Health   Financial Resource Strain:   . Difficulty of Paying Living Expenses:   Food Insecurity: No Food Insecurity  . Worried About Charity fundraiser in the Last Year: Never true  . Ran Out of Food in the Last Year: Never true  Transportation Needs:   . Lack of Transportation (Medical):   Marland Kitchen Lack of Transportation (Non-Medical):   Physical Activity:   . Days of Exercise per Week:   . Minutes of Exercise per Session:   Stress:   . Feeling of Stress :   Social Connections:   . Frequency of Communication with Friends and Family:   . Frequency of Social Gatherings with Friends and Family:   . Attends Religious Services:   . Active Member of Clubs or Organizations:   . Attends Archivist Meetings:   Marland Kitchen Marital Status:     reports that she has never smoked. She has never used smokeless tobacco. She reports that she does not drink alcohol and does not use drugs.  Functional Status Survey:    Family History  Problem Relation Age of Onset   . Dementia Mother   . Heart attack Father   . Heart disease Sister   . Breast cancer Sister 29  . Cancer Brother   . Colon cancer Neg Hx     Health Maintenance  Topic Date Due  . TETANUS/TDAP  03/11/2013  . INFLUENZA VACCINE  08/12/2019  . DEXA SCAN  Completed  . COVID-19 Vaccine  Completed  . PNA vac Low Risk Adult  Completed    Allergies  Allergen Reactions  . Bee Venom Shortness Of Breath, Itching, Rash and Other (See Comments)    Almost passed out, also  . Penicillins Rash    Has patient had a PCN reaction causing immediate rash, facial/tongue/throat swelling, SOB or lightheadedness with hypotension: Yes Has patient had a PCN reaction causing severe rash involving mucus membranes or skin  necrosis: No Has patient had a PCN reaction that required hospitalization: Unknown Has patient had a PCN reaction occurring within the last 10 years: No If all of the above answers are "NO", then may proceed with Cephalosporin use.     Outpatient Encounter Medications as of 08/21/2019  Medication Sig  . apixaban (ELIQUIS) 5 MG TABS tablet Take 1 tablet (5 mg total) by mouth 2 (two) times daily.  . CVS D3 50 MCG (2000 UT) CAPS TAKE 1 CAPSULE DAILY  . diphenhydrAMINE (BENADRYL) 25 MG tablet Take 2 tablets (50 mg total) by mouth every 6 (six) hours as needed for itching (or hives).  . EPINEPHrine 0.3 mg/0.3 mL IJ SOAJ injection Inject 0.3 mLs (0.3 mg total) into the muscle once as needed for up to 1 dose (severe allergic reaction).  . fluticasone (FLONASE) 50 MCG/ACT nasal spray Place 2 sprays into both nostrils daily as needed (for seasonal allergies). SPRAY 2 SPRAYS INTO EACH NOSTRIL EVERY DAY  . levothyroxine (SYNTHROID) 75 MCG tablet TAKE 1 TABLET BY MOUTH EVERY DAY  . loperamide (IMODIUM) 2 MG capsule Take 2 mg by mouth as needed for diarrhea or loose stools.   . Multiple Vitamin (MULTIVITAMIN WITH MINERALS) TABS tablet Take 1 tablet by mouth daily.  . [DISCONTINUED] feeding supplement,  ENSURE ENLIVE, (ENSURE ENLIVE) LIQD Take 237 mLs by mouth 2 (two) times daily between meals.  . [DISCONTINUED] memantine (NAMENDA) 10 MG tablet TAKE 1 TABLET BY MOUTH TWICE A DAY. Please call [phone] to scheduled a yearly appt or may also request refills from PCP. (Patient taking differently: Take 10 mg by mouth 2 (two) times daily. TAKE 1 TABLET BY MOUTH TWICE A DAY. Please call [phone] to scheduled a yearly appt or may also request refills from PCP.)   No facility-administered encounter medications on file as of 08/21/2019.    Review of Systems  Constitutional: Negative for chills, fever and malaise/fatigue.  HENT: Negative for hearing loss.   Eyes: Negative for blurred vision.  Respiratory: Negative for cough and shortness of breath.   Cardiovascular: Negative for chest pain, palpitations and leg swelling.  Gastrointestinal: Negative for abdominal pain, blood in stool, diarrhea and melena.  Genitourinary: Negative for dysuria.  Musculoskeletal: Positive for falls. Negative for joint pain.  Neurological: Positive for dizziness, loss of consciousness and weakness.  Endo/Heme/Allergies: Bruises/bleeds easily.  Psychiatric/Behavioral: Positive for memory loss. Negative for depression. The patient is not nervous/anxious and does not have insomnia.     Vitals:   08/21/19 0902  BP: 125/70  Pulse: 74  Temp: (!) 97 F (36.1 C)  Weight: 148 lb 13 oz (67.5 kg)  Height: 5' (1.524 m)   Body mass index is 29.06 kg/m. Physical Exam Vitals reviewed.  Constitutional:      General: She is not in acute distress.    Appearance: Normal appearance. She is not ill-appearing or toxic-appearing.  HENT:     Head: Normocephalic and atraumatic.     Right Ear: External ear normal.     Left Ear: External ear normal.     Nose: Nose normal.     Mouth/Throat:     Pharynx: Oropharynx is clear.  Eyes:     Extraocular Movements: Extraocular movements intact.     Conjunctiva/sclera: Conjunctivae normal.       Pupils: Pupils are equal, round, and reactive to light.  Cardiovascular:     Rate and Rhythm: Normal rate and regular rhythm.     Heart sounds: No murmur heard.  Pulmonary:     Effort: Pulmonary effort is normal.     Breath sounds: Normal breath sounds. No wheezing, rhonchi or rales.  Abdominal:     General: Bowel sounds are normal.     Palpations: Abdomen is soft.     Tenderness: There is no abdominal tenderness. There is no guarding or rebound.  Musculoskeletal:        General: Normal range of motion.     Cervical back: Neck supple.     Right lower leg: Edema present.     Left lower leg: No edema.     Comments: Seated in manual wheelchair  Lymphadenopathy:     Cervical: No cervical adenopathy.  Skin:    General: Skin is warm and dry.     Capillary Refill: Capillary refill takes less than 2 seconds.     Coloration: Skin is pale.  Neurological:     General: No focal deficit present.     Mental Status: She is alert. Mental status is at baseline.     Cranial Nerves: No cranial nerve deficit.     Motor: Weakness present.     Gait: Gait abnormal.  Psychiatric:        Mood and Affect: Mood normal.        Behavior: Behavior normal.     Comments: Pleasant and without complaints     Labs reviewed: Basic Metabolic Panel: Recent Labs    07/09/19 1219 07/09/19 1220 07/11/19 0323 07/11/19 0323 08/08/19 1341 08/08/19 1341 08/09/19 0507 08/10/19 0138 08/12/19 0157  NA  --    < > 139   < > 145   < > 146* 146* 138  K  --    < > 3.9   < > 3.7   < > 3.7 3.5 4.1  CL  --    < > 109   < > 110   < > 111 110 108  CO2  --    < > 21*   < > 22   < > 22 25 22   GLUCOSE  --    < > 134*   < > 148*   < > 99 131* 100*  BUN  --    < > 33*   < > 31*   < > 29* 26* 17  CREATININE  --    < > 1.24*   < > 1.47*   < > 1.15* 0.92 0.84  CALCIUM  --    < > 8.2*   < > 8.9   < > 8.3* 8.9 8.1*  MG 2.3  --  1.8  --  1.9  --   --   --   --   PHOS 3.5  --   --   --   --   --   --   --   --    < > =  values in this interval not displayed.   Liver Function Tests: Recent Labs    07/10/19 0642 07/11/19 0323 08/08/19 1341  AST 28 24 25   ALT 11 11 14   ALKPHOS 18* 18* 35*  BILITOT 0.7 0.6 0.9  PROT 5.3* 4.6* 6.4*  ALBUMIN 2.2* 1.9* 2.7*   No results for input(s): LIPASE, AMYLASE in the last 8760 hours. No results for input(s): AMMONIA in the last 8760 hours. CBC: Recent Labs    07/09/19 1220 07/10/19 0642 08/08/19 1341 08/10/19 0138 08/12/19 0157 08/13/19 0119 08/16/19 0456  WBC 10.5   < > 13.8*   < >  9.8 9.4 8.7  NEUTROABS 8.1*  --  11.4*  --  6.3  --   --   HGB 11.9*   < > 12.4   < > 11.4* 11.0* 11.2*  HCT 37.8   < > 40.5   < > 35.0* 33.9* 34.3*  MCV 100.8*   < > 103.1*   < > 96.7 99.1 97.2  PLT 136*   < > 237   < > 229 215 340   < > = values in this interval not displayed.   Cardiac Enzymes: Recent Labs    08/09/19 0507  CKTOTAL 44   BNP: Invalid input(s): POCBNP Lab Results  Component Value Date   HGBA1C 6.5 08/06/2016   Lab Results  Component Value Date   TSH 2.731 07/09/2019   Lab Results  Component Value Date   SEGBTDVV61 607 07/09/2019   Lab Results  Component Value Date   FOLATE 15.9 07/09/2019   No results found for: IRON, TIBC, FERRITIN  Imaging and Procedures obtained prior to SNF admission: DG Chest Port 1 View  Result Date: 08/08/2019 CLINICAL DATA:  Syncope. EXAM: PORTABLE CHEST 1 VIEW COMPARISON:  July 15, 2018. FINDINGS: The heart size and mediastinal contours are within normal limits. Both lungs are clear. No pneumothorax or pleural effusion is noted. The visualized skeletal structures are unremarkable. IMPRESSION: No active disease. Electronically Signed   By: Marijo Conception M.D.   On: 08/08/2019 13:55   VAS [place] LOWER EXTREMITY VENOUS (DVT)  Result Date: 08/11/2019  Lower Venous DVTStudy Indications: Edema.  Comparison Study: No prior study on file Performing Technologist: Sharion Dove RVS  Examination Guidelines: A complete  evaluation includes B-mode imaging, spectral Doppler, color Doppler, and power Doppler as needed of all accessible portions of each vessel. Bilateral testing is considered an integral part of a complete examination. Limited examinations for reoccurring indications may be performed as noted. The reflux portion of the exam is performed with the patient in reverse Trendelenburg.  +---------+---------------+---------+-----------+----------+--------------+ RIGHT    CompressibilityPhasicitySpontaneityPropertiesThrombus Aging +---------+---------------+---------+-----------+----------+--------------+ CFV      Full           Yes      Yes                                 +---------+---------------+---------+-----------+----------+--------------+ SFJ      Full                                                        +---------+---------------+---------+-----------+----------+--------------+ FV Prox  None                                         Acute          +---------+---------------+---------+-----------+----------+--------------+ FV Mid   None                                         Acute          +---------+---------------+---------+-----------+----------+--------------+ FV DistalNone  Acute          +---------+---------------+---------+-----------+----------+--------------+ PFV      Full                                                        +---------+---------------+---------+-----------+----------+--------------+ POP      None           No       No                   Acute          +---------+---------------+---------+-----------+----------+--------------+ PTV      None                                         Acute          +---------+---------------+---------+-----------+----------+--------------+ PERO     None                                         Acute           +---------+---------------+---------+-----------+----------+--------------+   +---------+---------------+---------+-----------+----------+--------------+ LEFT     CompressibilityPhasicitySpontaneityPropertiesThrombus Aging +---------+---------------+---------+-----------+----------+--------------+ CFV      Full           Yes      Yes                                 +---------+---------------+---------+-----------+----------+--------------+ SFJ      Full                                                        +---------+---------------+---------+-----------+----------+--------------+ FV Prox  Full                                                        +---------+---------------+---------+-----------+----------+--------------+ FV Mid   Full                                                        +---------+---------------+---------+-----------+----------+--------------+ FV DistalFull                                                        +---------+---------------+---------+-----------+----------+--------------+ PFV      Full                                                        +---------+---------------+---------+-----------+----------+--------------+  POP      Full           Yes      Yes                                 +---------+---------------+---------+-----------+----------+--------------+ PTV      Full                                                        +---------+---------------+---------+-----------+----------+--------------+ PERO     Full                                                        +---------+---------------+---------+-----------+----------+--------------+     Summary: RIGHT: - Findings consistent with acute deep vein thrombosis involving the right femoral vein, right popliteal vein, right posterior tibial veins, and right peroneal veins.  LEFT: - There is no evidence of deep vein thrombosis in the lower extremity.  *See  table(s) above for measurements and observations. Electronically signed by Deitra Mayo MD on 08/11/2019 at 8:54:29 AM.    Final     Assessment/Plan 1. Orthostatic syncope -cont compression binder, hose -monitor for dizziness on standing -PT, OT  2. Dementia without behavioral disturbance, unspecified dementia type ([place]) -admission packet indicates plan to transition to long-term care here after completes rehab -cont snf support  3. Failure to thrive in adult -with dehydration from poor po intake and unable to care for self at home and family unable to due to work and responsibilities -encourage po intake, hydration  4. Acute deep vein thrombosis (DVT) of other specified vein of right lower extremity ([place]) -completing course of eliquis for 3 mos, then stop (11/1) -monitor cbc, bmp  5. Acute renal failure superimposed on stage 3b chronic kidney disease, unspecified acute renal failure type ([place]) -resolved, was due to #6 -CKD3b baseline--Avoid nephrotoxic agents like nsaids, dose adjust renally excreted meds, hydrate.  6. Dehydration -was not able to manage own intake--needs encouragement and help prepping items  7. Hypothyroidism, unspecified type -cont levothyroxine - Lab Results  Component Value Date   TSH 2.731 07/09/2019   8. Generalized weakness -due to progressive dementia and poor po intake, here for rehab and transition to LTC  9.  Stage 3 pressure injury sacrum/buttock:  cleanse with NS, pat dry, apply Santyl and cover with foam dressing QD -offload pressure with positional and location changes -encourage healthy diet -cont ensure enlive bid b/w meals -palliative care consult to help with goals of care, pt comes to [place] full code  Family/ staff Communication: discussed with snf nurse  Labs/tests ordered:  Cbc, bmp 1 wk  Sterling Ucci L. Rhilynn Preyer, D.O. [facility] [address], [place] [address] Cell Phone  (Mon-Fri 8am-5pm):  [phone] On Call:  [phone] & follow prompts after 5pm & weekends Office Phone:  [phone] Office Fax:  [phone]

## 2019-08-23 ENCOUNTER — Telehealth: Payer: Self-pay | Admitting: Family Medicine

## 2019-08-23 LAB — BASIC METABOLIC PANEL
BUN: 22 — AB (ref 4–21)
CO2: 22 (ref 13–22)
Chloride: 99 (ref 99–108)
Creatinine: 0.8 (ref 0.5–1.1)
Glucose: 91
Potassium: 3.7 (ref 3.4–5.3)
Sodium: 137 (ref 137–147)

## 2019-08-23 LAB — CBC AND DIFFERENTIAL
HCT: 32 — AB (ref 36–46)
Hemoglobin: 10.6 — AB (ref 12.0–16.0)
Platelets: 339 (ref 150–399)
WBC: 9.3

## 2019-08-23 LAB — COMPREHENSIVE METABOLIC PANEL
Calcium: 8.8 (ref 8.7–10.7)
GFR calc Af Amer: 75.48
GFR calc non Af Amer: 65.13

## 2019-08-23 LAB — CBC: RBC: 3.3 — AB (ref 3.87–5.11)

## 2019-08-23 NOTE — Telephone Encounter (Signed)
Paperwork given to PCP.  

## 2019-08-23 NOTE — Telephone Encounter (Signed)
I have placed a HH cert and plan of care in the bin upfront with a charge sheet.    

## 2019-08-25 DIAGNOSIS — I82409 Acute embolism and thrombosis of unspecified deep veins of unspecified lower extremity: Secondary | ICD-10-CM | POA: Insufficient documentation

## 2019-08-25 DIAGNOSIS — R627 Adult failure to thrive: Secondary | ICD-10-CM | POA: Insufficient documentation

## 2019-08-25 MED ORDER — ENSURE ENLIVE PO LIQD: 237.0000 mL | Freq: Two times a day (BID) | ORAL | 12 refills | Status: AC

## 2019-08-25 MED ORDER — MEMANTINE HCL 10 MG PO TABS: 10.0000 mg | ORAL_TABLET | Freq: Two times a day (BID) | ORAL | 0 refills | Status: AC

## 2019-08-25 MED ORDER — DIPHENHYDRAMINE HCL 25 MG PO TABS
50.0000 mg | ORAL_TABLET | Freq: Four times a day (QID) | ORAL | 0 refills | Status: AC | PRN
Start: 2019-08-25 — End: ?

## 2019-08-27 DIAGNOSIS — F0390 Unspecified dementia without behavioral disturbance: Secondary | ICD-10-CM

## 2019-08-27 DIAGNOSIS — I951 Orthostatic hypotension: Secondary | ICD-10-CM | POA: Diagnosis not present

## 2019-08-27 DIAGNOSIS — F0280 Dementia in other diseases classified elsewhere without behavioral disturbance: Secondary | ICD-10-CM

## 2019-08-27 DIAGNOSIS — Z8542 Personal history of malignant neoplasm of other parts of uterus: Secondary | ICD-10-CM

## 2019-08-27 DIAGNOSIS — N189 Chronic kidney disease, unspecified: Secondary | ICD-10-CM

## 2019-08-27 DIAGNOSIS — I1310 Hypertensive heart and chronic kidney disease without heart failure, with stage 1 through stage 4 chronic kidney disease, or unspecified chronic kidney disease: Secondary | ICD-10-CM

## 2019-08-27 DIAGNOSIS — D631 Anemia in chronic kidney disease: Secondary | ICD-10-CM

## 2019-08-27 DIAGNOSIS — G629 Polyneuropathy, unspecified: Secondary | ICD-10-CM

## 2019-08-27 DIAGNOSIS — M6258 Muscle wasting and atrophy, not elsewhere classified, other site: Secondary | ICD-10-CM | POA: Diagnosis not present

## 2019-08-27 DIAGNOSIS — Z86010 Personal history of colonic polyps: Secondary | ICD-10-CM

## 2019-08-27 DIAGNOSIS — R41841 Cognitive communication deficit: Secondary | ICD-10-CM | POA: Diagnosis not present

## 2019-08-27 DIAGNOSIS — R1312 Dysphagia, oropharyngeal phase: Secondary | ICD-10-CM | POA: Diagnosis not present

## 2019-08-27 DIAGNOSIS — E039 Hypothyroidism, unspecified: Secondary | ICD-10-CM

## 2019-08-27 DIAGNOSIS — D696 Thrombocytopenia, unspecified: Secondary | ICD-10-CM

## 2019-08-27 DIAGNOSIS — H8110 Benign paroxysmal vertigo, unspecified ear: Secondary | ICD-10-CM

## 2019-08-27 DIAGNOSIS — E785 Hyperlipidemia, unspecified: Secondary | ICD-10-CM

## 2019-08-27 DIAGNOSIS — K219 Gastro-esophageal reflux disease without esophagitis: Secondary | ICD-10-CM

## 2019-08-27 NOTE — Telephone Encounter (Signed)
FYI

## 2019-08-27 NOTE — Telephone Encounter (Signed)
Picked up from the back and sent to scan  

## 2019-08-27 NOTE — Telephone Encounter (Signed)
Form completed and placed in basket  

## 2019-08-30 ENCOUNTER — Other Ambulatory Visit: Payer: Self-pay | Admitting: Family Medicine

## 2019-09-13 ENCOUNTER — Non-Acute Institutional Stay (SKILLED_NURSING_FACILITY): Payer: Medicare Other | Admitting: Family

## 2019-09-13 ENCOUNTER — Encounter: Payer: Self-pay | Admitting: Family

## 2019-09-13 DIAGNOSIS — R531 Weakness: Secondary | ICD-10-CM | POA: Diagnosis not present

## 2019-09-13 DIAGNOSIS — I82491 Acute embolism and thrombosis of other specified deep vein of right lower extremity: Secondary | ICD-10-CM | POA: Diagnosis not present

## 2019-09-13 DIAGNOSIS — F0390 Unspecified dementia without behavioral disturbance: Secondary | ICD-10-CM

## 2019-09-13 DIAGNOSIS — I951 Orthostatic hypotension: Secondary | ICD-10-CM | POA: Diagnosis not present

## 2019-09-13 DIAGNOSIS — E039 Hypothyroidism, unspecified: Secondary | ICD-10-CM

## 2019-09-13 NOTE — Progress Notes (Signed)
Location:    [facility] Room Number: 106/P Place of Service:  SNF [phone]) Provider:  Marlowe Sax NP  Midge Minium, MD  Patient Care Team: Midge Minium, MD as PCP - General (Family Medicine) Marcial Pacas, MD as Consulting Physician (Neurology) Ladene Artist, MD as Consulting Physician (Gastroenterology) Madelin Rear, [facility] as Pharmacist (Pharmacist)  Extended Emergency Contact Information Primary Emergency Contact: Webb,Kristin Address: [address]          [place], [place] [address] Johnnette Litter of [place] Phone: [phone] Mobile Phone: [phone] Relation: Daughter Secondary Emergency Contact: Vanessa Barbara Address: [address]          [place], [place] [address] Johnnette Litter of [place] Phone: [phone] Mobile Phone: [phone] Relation: Daughter  Code Status:  Full Code Goals of care: Advanced Directive information Advanced Directives 09/13/2019  Does Patient Have a Medical Advance Directive? Yes  Type of Paramedic of [place];Living will  Does patient want to make changes to medical advance directive? No - Patient declined  Copy of [place] in Chart? -  Would patient like information on creating a medical advance directive? -     Chief Complaint  Patient presents with   Medical Management of Chronic Issues    Routine Visit of Medical Management    HPI:  Pt is a 86 y.o. female seen today for medical management of chronic diseases.she is seen sitting on recliner at the bedside with facility Nurse present in the room.she denies any acute issues during visit.States ready to go home then will return.Nurse reports no new concerns.she is here post hospital admission 08/08/2019 - 08/17/2019 at [facility] for weakness and fall.she was previous admitted with similar symptoms and was treated for Orthostatic blood pressure midodrine was ordered but she never took it.Her  blood pressure has been stable here.she also was treated for right lower extremity swelling ultrasound indicated DVT.treated with Lovenox x 5 days at the hospital then switched to  Eliquis.IVC filter was placed due to risk for GI bleed and falls.No recent fall. Nurse reports appetite has improved.     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   Past Surgical History:  Procedure Laterality Date   CATARACT EXTRACTION     CHOLECYSTECTOMY     FOOT SURGERY     Right   IR IVC FILTER PLMT / S&I /IMG GUID/MOD SED  08/10/2019   KNEE SURGERY     Right   VAGINAL HYSTERECTOMY      Allergies  Allergen Reactions   Bee Venom Shortness Of Breath, Itching, Rash and Other (See Comments)    Almost passed out, also   Penicillins Rash    Has patient had a PCN reaction causing immediate rash, facial/tongue/throat swelling, SOB or lightheadedness with hypotension: Yes Has patient had a PCN reaction causing severe rash involving mucus membranes or skin necrosis: No Has patient had a PCN reaction that required hospitalization: Unknown Has patient had a PCN reaction occurring within the last 10 years: No If all of the above answers are "NO", then may proceed with Cephalosporin use.  Allergies as of 09/13/2019      Reactions   Bee Venom Shortness Of Breath, Itching, Rash, Other (See Comments)   Almost passed out, also   Penicillins Rash   Has patient had a PCN reaction causing immediate rash, facial/tongue/throat swelling, SOB or lightheadedness with hypotension: Yes Has patient had a PCN reaction causing severe rash involving mucus membranes or skin necrosis: No Has patient had a PCN reaction that required  hospitalization: Unknown Has patient had a PCN reaction occurring within the last 10 years: No If all of the above answers are "NO", then may proceed with Cephalosporin use.      Medication List       Accurate as of September 13, 2019  2:56 PM. If you have any questions, ask your nurse or doctor.        apixaban 5 MG Tabs tablet Commonly known as: ELIQUIS Take 1 tablet (5 mg total) by mouth 2 (two) times daily.   CVS D3 50 MCG (2000 UT) Caps Generic drug: Cholecalciferol TAKE 1 CAPSULE DAILY   diphenhydrAMINE 25 MG tablet Commonly known as: BENADRYL Take 2 tablets (50 mg total) by mouth every 6 (six) hours as needed (hives or severe allergic reaction).   EPINEPHrine 0.3 mg/0.3 mL Soaj injection Commonly known as: EPI-PEN Inject 0.3 mLs (0.3 mg total) into the muscle once as needed for up to 1 dose (severe allergic reaction).   feeding supplement (ENSURE ENLIVE) Liqd Take 237 mLs by mouth 2 (two) times daily between meals.   fluticasone 50 MCG/ACT nasal spray Commonly known as: FLONASE Place 2 sprays into both nostrils daily as needed (for seasonal allergies). SPRAY 2 SPRAYS INTO EACH NOSTRIL EVERY DAY   levothyroxine 75 MCG tablet Commonly known as: SYNTHROID TAKE 1 TABLET BY MOUTH EVERY DAY   loperamide 2 MG capsule Commonly known as: IMODIUM Take 2 mg by mouth as needed for diarrhea or loose stools.   memantine 10 MG tablet Commonly known as: NAMENDA Take 1 tablet (10 mg total) by mouth 2 (two) times daily. TAKE 1 TABLET BY MOUTH TWICE A DAY.   multivitamin with minerals Tabs tablet Take 1 tablet by mouth daily.       Review of Systems  Constitutional: Negative for appetite change, chills, fatigue and fever.  HENT: Positive for hearing loss. Negative for congestion, rhinorrhea, sinus pressure, sinus pain, sneezing and sore throat.   Eyes: Negative for discharge, redness and itching.  Respiratory: Negative for cough, chest tightness, shortness of breath and  wheezing.   Cardiovascular: Positive for leg swelling. Negative for chest pain and palpitations.  Gastrointestinal: Negative for abdominal distention, abdominal pain, constipation, diarrhea, nausea and vomiting.  Endocrine: Negative for cold intolerance, heat intolerance, polydipsia, polyphagia and polyuria.  Genitourinary: Negative for difficulty urinating, dysuria, flank pain, frequency and urgency.  Musculoskeletal: Positive for gait problem. Negative for arthralgias, joint swelling, myalgias and neck pain.  Skin: Negative for color change, pallor and rash.  Neurological: Negative for dizziness, speech difficulty, weakness, light-headedness and headaches.  Hematological: Does not bruise/bleed easily.  Psychiatric/Behavioral: Negative for agitation, behavioral problems and sleep disturbance. The patient is not nervous/anxious.     Immunization History  Administered Date(s) Administered   Fluad Quad(high Dose 65+) 11/07/2018   Influenza, High Dose Seasonal PF 10/22/2015, 10/14/2016, 09/16/2017   Influenza,inj,Quad PF,6+ Mos 10/30/2014   PFIZER SARS-COV-2 Vaccination 03/11/2019, 04/11/2019   Pneumococcal Conjugate-13 11/05/2015   Pneumococcal Polysaccharide-23 01/06/2001, 10/22/2015, 08/05/2016   Td 03/12/2003   Zoster Recombinat (Shingrix) 08/06/2016  Pertinent  Health Maintenance Due  Topic Date Due   INFLUENZA VACCINE  08/12/2019   DEXA SCAN  Completed   PNA vac Low Risk Adult  Completed   Fall Risk  07/05/2019 11/07/2018 11/07/2018 07/24/2018 09/16/2017  Falls in the past year? 1 1 1 1  No  Comment - - - - -  Number falls in past yr: 0 1 1 1  -  Injury with Fall? 0 0 0 1 -  Comment - - - knot on her head -  Risk for fall due to : Impaired balance/gait;Impaired mobility Impaired balance/gait;Impaired mobility Impaired balance/gait;Impaired mobility - -  Follow up Falls evaluation completed Falls evaluation completed Falls evaluation completed - -   Functional Status  Survey:    Vitals:   09/13/19 1445  BP: 118/69  Pulse: 72  Resp: 18  Temp: 97.7 F (36.5 C)  TempSrc: Oral  Weight: 147 lb (66.7 kg)  Height: 5\' 2"  (1.575 m)   Body mass index is 26.89 kg/m. Physical Exam Vitals and nursing note reviewed.  Constitutional:      General: She is not in acute distress.    Appearance: She is overweight. She is not ill-appearing.  HENT:     Head: Normocephalic.     Nose: Nose normal. No congestion or rhinorrhea.     Mouth/Throat:     Mouth: Mucous membranes are moist.     Pharynx: Oropharynx is clear. No oropharyngeal exudate or posterior oropharyngeal erythema.  Eyes:     General: No scleral icterus.       Right eye: No discharge.        Left eye: No discharge.     Conjunctiva/sclera: Conjunctivae normal.     Pupils: Pupils are equal, round, and reactive to light.  Cardiovascular:     Rate and Rhythm: Normal rate and regular rhythm.     Pulses: Normal pulses.     Heart sounds: Normal heart sounds. No murmur heard.  No friction rub. No gallop.   Pulmonary:     Effort: Pulmonary effort is normal. No respiratory distress.     Breath sounds: Normal breath sounds. No wheezing, rhonchi or rales.  Chest:     Chest wall: No tenderness.  Abdominal:     General: Bowel sounds are normal. There is no distension.     Palpations: Abdomen is soft. There is no mass.     Tenderness: There is no abdominal tenderness. There is no right CVA tenderness, left CVA tenderness, guarding or rebound.  Musculoskeletal:        General: No tenderness.     Cervical back: Normal range of motion. No rigidity or tenderness.     Comments: Unsteady gait.bilateral lower extremities 1-2 + right > left   Lymphadenopathy:     Cervical: No cervical adenopathy.  Skin:    General: Skin is warm and dry.     Coloration: Skin is not jaundiced or pale.     Findings: No bruising, erythema or rash.  Neurological:     Mental Status: She is alert. Mental status is at baseline.      Cranial Nerves: No cranial nerve deficit.     Motor: No weakness.     Gait: Gait abnormal.  Psychiatric:        Mood and Affect: Mood normal.        Behavior: Behavior normal.        Thought Content: Thought content normal.        Cognition and Memory:  Memory is impaired.        Judgment: Judgment normal.     Labs reviewed: Recent Labs    07/09/19 1219 07/09/19 1220 07/11/19 0323 07/11/19 0323 08/08/19 1341 08/08/19 1341 08/09/19 0507 08/09/19 0507 08/10/19 0138 08/12/19 0157 08/23/19 0000  NA  --    < > 139   < > 145   < > 146*   < > 146* 138 137  K  --    < > 3.9   < > 3.7   < > 3.7   < > 3.5 4.1 3.7  CL  --    < > 109   < > 110   < > 111   < > 110 108 99  CO2  --    < > 21*   < > 22   < > 22   < > 25 22 22   GLUCOSE  --    < > 134*   < > 148*   < > 99  --  131* 100*  --   BUN  --    < > 33*   < > 31*   < > 29*   < > 26* 17 22*  CREATININE  --    < > 1.24*   < > 1.47*   < > 1.15*   < > 0.92 0.84 0.8  CALCIUM  --    < > 8.2*   < > 8.9   < > 8.3*   < > 8.9 8.1* 8.8  MG 2.3  --  1.8  --  1.9  --   --   --   --   --   --   PHOS 3.5  --   --   --   --   --   --   --   --   --   --    < > = values in this interval not displayed.   Recent Labs    07/10/19 0642 07/11/19 0323 08/08/19 1341  AST 28 24 25   ALT 11 11 14   ALKPHOS 18* 18* 35*  BILITOT 0.7 0.6 0.9  PROT 5.3* 4.6* 6.4*  ALBUMIN 2.2* 1.9* 2.7*   Recent Labs    07/09/19 1220 07/10/19 0642 08/08/19 1341 08/10/19 0138 08/12/19 0157 08/12/19 0157 08/13/19 0119 08/16/19 0456 08/23/19 0000  WBC 10.5   < > 13.8*   < > 9.8   < > 9.4 8.7 9.3  NEUTROABS 8.1*  --  11.4*  --  6.3  --   --   --   --   HGB 11.9*   < > 12.4   < > 11.4*   < > 11.0* 11.2* 10.6*  HCT 37.8   < > 40.5   < > 35.0*   < > 33.9* 34.3* 32*  MCV 100.8*   < > 103.1*   < > 96.7  --  99.1 97.2  --   PLT 136*   < > 237   < > 229   < > 215 340 339   < > = values in this interval not displayed.   Lab Results  Component Value Date   TSH 2.731  07/09/2019   Lab Results  Component Value Date   HGBA1C 6.5 08/06/2016   Lab Results  Component Value Date   CHOL 128 07/05/2019   HDL 34.10 (L) 07/05/2019   LDLCALC 67 07/05/2019   LDLDIRECT 115.0 08/06/2016   TRIG 136.0 07/05/2019  CHOLHDL 4 07/05/2019    Significant Diagnostic Results in last 30 days:  No results found.  Assessment/Plan 1. Orthostatic syncope No syncope episode here in rehab. - continue with abdominal binder and hose. - continue PT/OT   2. Acute deep vein thrombosis (DVT) of other specified vein of right lower extremity (HCC) Right leg edema > left none tender to palpation. - continue on Eliquis - H/H stable  3. Hypothyroidism, unspecified type Lab Results  Component Value Date   TSH 2.731 07/09/2019  - continue on levothyroxine   4. Generalized weakness Has improved. - continue with PT/OT  - continue to encourage oral intake  5. Dementia without behavioral disturbance, unspecified dementia type ([place]) No behavioral issues reported - continue on memantine  - continue supportive care   Family/ staff Communication: Reviewed plan of care with patient and facility Nurse  Labs/tests ordered: None

## 2019-09-21 DIAGNOSIS — Z20822 Contact with and (suspected) exposure to covid-19: Secondary | ICD-10-CM | POA: Diagnosis not present

## 2019-10-10 ENCOUNTER — Telehealth: Payer: Self-pay

## 2019-10-10 NOTE — Progress Notes (Addendum)
Chronic Care Management Pharmacy Assistant   Name: Kristina [name]  MRN: [id number] DOB: 03/16/1931  Reason for Encounter: Disease State   PCP : Midge Minium, MD  Allergies:   Allergies  Allergen Reactions   Bee Venom Shortness Of Breath, Itching, Rash and Other (See Comments)    Almost passed out, also   Penicillins Rash    Has patient had a PCN reaction causing immediate rash, facial/tongue/throat swelling, SOB or lightheadedness with hypotension: Yes Has patient had a PCN reaction causing severe rash involving mucus membranes or skin necrosis: No Has patient had a PCN reaction that required hospitalization: Unknown Has patient had a PCN reaction occurring within the last 10 years: No If all of the above answers are "NO", then may proceed with Cephalosporin use.     Medications: Outpatient Encounter Medications as of 10/10/2019  Medication Sig   apixaban (ELIQUIS) 5 MG TABS tablet Take 1 tablet (5 mg total) by mouth 2 (two) times daily.   CVS D3 50 MCG (2000 UT) CAPS TAKE 1 CAPSULE DAILY   diphenhydrAMINE (BENADRYL) 25 MG tablet Take 2 tablets (50 mg total) by mouth every 6 (six) hours as needed (hives or severe allergic reaction).   EPINEPHrine 0.3 mg/0.3 mL IJ SOAJ injection Inject 0.3 mLs (0.3 mg total) into the muscle once as needed for up to 1 dose (severe allergic reaction).   feeding supplement, ENSURE ENLIVE, (ENSURE ENLIVE) LIQD Take 237 mLs by mouth 2 (two) times daily between meals.   fluticasone (FLONASE) 50 MCG/ACT nasal spray Place 2 sprays into both nostrils daily as needed (for seasonal allergies). SPRAY 2 SPRAYS INTO EACH NOSTRIL EVERY DAY   levothyroxine (SYNTHROID) 75 MCG tablet TAKE 1 TABLET BY MOUTH EVERY DAY   loperamide (IMODIUM) 2 MG capsule Take 2 mg by mouth as needed for diarrhea or loose stools.    memantine (NAMENDA) 10 MG tablet Take 1 tablet (10 mg total) by mouth 2 (two) times daily. TAKE 1 TABLET BY MOUTH TWICE A DAY.   Multiple  Vitamin (MULTIVITAMIN WITH MINERALS) TABS tablet Take 1 tablet by mouth daily.   No facility-administered encounter medications on file as of 10/10/2019.    Current Diagnosis: Patient Active Problem List   Diagnosis Date Noted   Deep venous thrombosis ([place]) 08/25/2019   Failure to thrive in adult 08/25/2019   Syncope 08/08/2019   Elevated troponin    Pressure injury of skin 07/10/2019   Orthostatic syncope 07/09/2019   Macrocytic anemia 07/09/2019   Thrombocytopenia ([place]) 07/09/2019   Pressure injury of buttock, stage 3 ([place]) 07/05/2019   Generalized weakness 07/15/2018   Dehydration 07/15/2018   Acute on chronic kidney failure ([place]) 07/15/2018   Hypertriglyceridemia 02/07/2017   Vitamin D deficiency 08/06/2016   Physical exam 08/06/2016   Abnormality of gait 04/23/2016   Hypothyroidism 02/05/2016   HTN (hypertension) 02/05/2016   Dementia ([place]) 09/23/2015   Paresthesia 01/28/2014   Hyperreflexia 01/28/2014   GERD 01/16/2008   OTHER POSTOPERATIVE FUNCTIONAL DISORDERS 01/16/2008   TUBULOVILLOUS ADENOMA, COLON, HX OF 01/10/2008   Have you had any problems recently with your health? Per patients daughter, No problems with health, however her dementia is getting worse. Have you had any problems with your pharmacy? Per patients daughter No problems with pharmacy , due to patient being in assisting living, all medications are handled through the facility. What issues or side effects are you having with your medications? Per patients daughter , no side effects to medications.  What would you like me to pass along to [facility] for them to help you with? Per patients daughter, not at the moment.  Kristina [name] ,[place] Pharmacist Assistant [phone]  Spoke with patients daughter and rescheduled appointment from 10-19-2019 to 11-22-19 at 9:00am.        Follow-Up:  Pharmacist Review

## 2019-10-15 ENCOUNTER — Telehealth: Payer: Self-pay | Admitting: Family Medicine

## 2019-10-15 NOTE — Telephone Encounter (Signed)
Pt's daughter called in wanting to make Dr. Birdie Riddle aware that her Mother is now living in an assisting living facility, she is at [facility].   She wanted to know when she needs to f/up with [facility]. Last visit was 07/05/19

## 2019-10-15 NOTE — Telephone Encounter (Signed)
Please advise, it appears as though nursing home has providers per chart notes.

## 2019-10-16 NOTE — Telephone Encounter (Signed)
If the facility has providers, I would recommend that they see her at the facility.  This would be less disruptive to her in her current state

## 2019-10-17 ENCOUNTER — Telehealth: Payer: Medicare Other

## 2019-10-17 NOTE — Telephone Encounter (Signed)
Called and left a detailed message to advise pt daughter.

## 2019-10-19 ENCOUNTER — Telehealth: Payer: Medicare Other

## 2019-10-29 ENCOUNTER — Encounter: Payer: Self-pay | Admitting: Family

## 2019-10-29 ENCOUNTER — Non-Acute Institutional Stay (SKILLED_NURSING_FACILITY): Payer: Medicare Other | Admitting: Family

## 2019-10-29 DIAGNOSIS — E039 Hypothyroidism, unspecified: Secondary | ICD-10-CM

## 2019-10-29 DIAGNOSIS — L8995 Pressure ulcer of unspecified site, unstageable: Secondary | ICD-10-CM | POA: Diagnosis not present

## 2019-10-29 DIAGNOSIS — F0390 Unspecified dementia without behavioral disturbance: Secondary | ICD-10-CM

## 2019-10-29 DIAGNOSIS — L89303 Pressure ulcer of unspecified buttock, stage 3: Secondary | ICD-10-CM

## 2019-10-29 DIAGNOSIS — R531 Weakness: Secondary | ICD-10-CM

## 2019-10-29 NOTE — Progress Notes (Signed)
Location:    [facility] Room Number: 272/Z Place of Service:  SNF [phone]) Provider:  Marlowe Sax NP  Midge Minium, MD  Patient Care Team: Midge Minium, MD as PCP - General (Family Medicine) Marcial Pacas, MD as Consulting Physician (Neurology) Ladene Artist, MD as Consulting Physician (Gastroenterology) Madelin Rear, [facility] as Pharmacist (Pharmacist)  Extended Emergency Contact Information Primary Emergency Contact: Webb,Kristin Address: [address]          [place], [place] [address] Johnnette Litter of [place] Phone: [phone] Mobile Phone: [phone] Relation: Daughter Secondary Emergency Contact: Vanessa Barbara Address: [address]          [place], [place] [address] Johnnette Litter of [place] Phone: [phone] Mobile Phone: [phone] Relation: Daughter  Code Status:  Full Code Goals of care: Advanced Directive information Advanced Directives 10/29/2019  Does Patient Have a Medical Advance Directive? Yes  Type of Paramedic of [place];Living will  Does patient want to make changes to medical advance directive? No - Patient declined  Copy of [place] in Chart? Yes - validated most recent copy scanned in chart (See row information)  Would patient like information on creating a medical advance directive? -     Chief Complaint  Patient presents with   Medical Management of Chronic Issues    Routine Visit of Medical Mangement    HPI:  Pt is a 86 y.o. female seen today for medical management of chronic diseases.she is seen in bed.she denies any acute issues during visit.she has a medical history of  Hypertension,Hyperlipidemia,BPPV,hypothroidism,CKD stage 3,Endometrial cancer,Peripheral Neuropathy,Duodenal ulcer,Dementia among other conditions.she was here for therapy post hospital admission from 08/08/2019 -08/17/2019 for syncope secondary to Orthostatic hypotension.  Has  sacrum and heel ulcers managed by wound care provider Cristi Loron PA and Facility Wound care Nurse.she has had progressive weight gain about 4 lbs since admission.Weight beneficial for patient due to pressure ulcer.     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   Past Surgical History:  Procedure Laterality Date   CATARACT EXTRACTION     CHOLECYSTECTOMY     FOOT SURGERY     Right   IR IVC FILTER PLMT / S&I /IMG GUID/MOD SED  08/10/2019   KNEE SURGERY     Right   VAGINAL HYSTERECTOMY      Allergies  Allergen Reactions   Bee Venom Shortness Of Breath, Itching, Rash and Other (See Comments)    Almost passed out, also   Penicillins Rash    Has patient had a PCN reaction causing immediate rash, facial/tongue/throat swelling, SOB or lightheadedness with hypotension: Yes Has patient had a PCN reaction causing severe rash involving mucus membranes or skin necrosis: No Has patient had a PCN reaction that required hospitalization: Unknown Has patient had a PCN reaction occurring within the last 10 years: No If all of the above answers are "NO", then may proceed with Cephalosporin use.     Allergies as of 10/29/2019      Reactions   Bee Venom Shortness Of Breath, Itching, Rash, Other (See Comments)   Almost passed out, also  Penicillins Rash   Has patient had a PCN reaction causing immediate rash, facial/tongue/throat swelling, SOB or lightheadedness with hypotension: Yes Has patient had a PCN reaction causing severe rash involving mucus membranes or skin necrosis: No Has patient had a PCN reaction that required hospitalization: Unknown Has patient had a PCN reaction occurring within the last  10 years: No If all of the above answers are "NO", then may proceed with Cephalosporin use.      Medication List       Accurate as of October 29, 2019  2:37 PM. If you have any questions, ask your nurse or doctor.        apixaban 5 MG Tabs tablet Commonly known as: ELIQUIS Take 1 tablet (5 mg total) by mouth 2 (two) times daily.   [employer] D3 50 MCG (2000 UT) Caps Generic drug: Cholecalciferol TAKE 1 CAPSULE DAILY   diphenhydrAMINE 25 MG tablet Commonly known as: BENADRYL Take 2 tablets (50 mg total) by mouth every 6 (six) hours as needed (hives or severe allergic reaction).   EPINEPHrine 0.3 mg/0.3 mL Soaj injection Commonly known as: EPI-PEN Inject 0.3 mLs (0.3 mg total) into the muscle once as needed for up to 1 dose (severe allergic reaction).   feeding supplement Liqd Take 237 mLs by mouth 2 (two) times daily between meals.   fluticasone 50 MCG/ACT nasal spray Commonly known as: FLONASE Place 2 sprays into both nostrils daily as needed (for seasonal allergies). SPRAY 2 SPRAYS INTO EACH NOSTRIL EVERY DAY   levothyroxine 75 MCG tablet Commonly known as: SYNTHROID TAKE 1 TABLET BY MOUTH EVERY DAY   loperamide 2 MG capsule Commonly known as: IMODIUM Take 2 mg by mouth as needed for diarrhea or loose stools.   memantine 10 MG tablet Commonly known as: NAMENDA Take 1 tablet (10 mg total) by mouth 2 (two) times daily. TAKE 1 TABLET BY MOUTH TWICE A DAY.   multivitamin with minerals Tabs tablet Take 1 tablet by mouth daily.       Review of Systems  Constitutional: Negative for activity change, appetite change, chills, fatigue and fever.  HENT: Negative for congestion, rhinorrhea, sinus pressure, sinus pain, sneezing, sore throat and trouble swallowing.   Eyes: Negative for discharge, redness and itching.  Respiratory: Negative for cough, chest tightness, shortness of breath and wheezing.   Cardiovascular: Negative for chest pain, palpitations and leg swelling.    Gastrointestinal: Negative for abdominal distention, abdominal pain, constipation, diarrhea, nausea and vomiting.  Endocrine: Negative for cold intolerance, heat intolerance, polydipsia, polyphagia and polyuria.  Genitourinary: Negative for difficulty urinating, dysuria, flank pain, frequency and urgency.  Musculoskeletal: Positive for gait problem. Negative for joint swelling, myalgias and neck pain.  Skin: Positive for wound. Negative for color change, pallor and rash.       Sacral ulcer and bilateral heel  Neurological: Negative for dizziness, speech difficulty, weakness, light-headedness, numbness and headaches.  Hematological: Does not bruise/bleed easily.  Psychiatric/Behavioral: Negative for agitation, confusion and sleep disturbance. The patient is not nervous/anxious.     Immunization History  Administered Date(s) Administered   Fluad Quad(high Dose 65+) 11/07/2018   Influenza, High Dose Seasonal PF 10/22/2015, 10/14/2016, 09/16/2017   Influenza,inj,Quad PF,6+ Mos 10/30/2014   PFIZER SARS-COV-2 Vaccination 03/11/2019, 04/11/2019   Pneumococcal Conjugate-13 11/05/2015   Pneumococcal Polysaccharide-23 01/06/2001, 10/22/2015, 08/05/2016   Td 03/12/2003   Zoster Recombinat (Shingrix) 08/06/2016   Pertinent  Health Maintenance Due  Topic Date Due   INFLUENZA VACCINE  08/12/2019   DEXA  SCAN  Completed   PNA vac Low Risk Adult  Completed   Fall Risk  07/05/2019 11/07/2018 11/07/2018 07/24/2018 09/16/2017  Falls in the past year? 1 1 1 1  No  Comment - - - - -  Number falls in past yr: 0 1 1 1  -  Injury with Fall? 0 0 0 1 -  Comment - - - knot on her head -  Risk for fall due to : Impaired balance/gait;Impaired mobility Impaired balance/gait;Impaired mobility Impaired balance/gait;Impaired mobility - -  Follow up Falls evaluation completed Falls evaluation completed Falls evaluation completed - -   Functional Status Survey:    Vitals:   10/29/19 1434  BP: 115/73   Pulse: 71  Resp: 17  Temp: (!) 97.5 F (36.4 C)  Weight: 150 lb 6.4 oz (68.2 kg)  Height: 5\' 2"  (1.575 m)   Body mass index is 27.51 kg/m. Physical Exam Vitals and nursing note reviewed.  Constitutional:      General: She is not in acute distress.    Appearance: She is overweight. She is not ill-appearing.  HENT:     Head: Normocephalic.     Nose: Nose normal. No congestion or rhinorrhea.     Mouth/Throat:     Mouth: Mucous membranes are moist.     Pharynx: Oropharynx is clear. No oropharyngeal exudate or posterior oropharyngeal erythema.  Eyes:     General: No scleral icterus.       Right eye: No discharge.        Left eye: No discharge.     Conjunctiva/sclera: Conjunctivae normal.     Pupils: Pupils are equal, round, and reactive to light.  Cardiovascular:     Rate and Rhythm: Normal rate and regular rhythm.     Pulses: Normal pulses.     Heart sounds: Normal heart sounds. No murmur heard.  No friction rub. No gallop.   Pulmonary:     Effort: Pulmonary effort is normal. No respiratory distress.     Breath sounds: Normal breath sounds. No wheezing, rhonchi or rales.  Chest:     Chest wall: No tenderness.  Abdominal:     General: Bowel sounds are normal. There is no distension.     Palpations: Abdomen is soft. There is no mass.     Tenderness: There is no abdominal tenderness. There is no right CVA tenderness, guarding or rebound.  Musculoskeletal:        General: No swelling or tenderness.     Cervical back: Normal range of motion. No rigidity or tenderness.     Right lower leg: No edema.     Left lower leg: No edema.     Comments: Unsteady gait   Lymphadenopathy:     Cervical: No cervical adenopathy.  Skin:    General: Skin is warm and dry.     Coloration: Skin is not pale.     Findings: No bruising, erythema, lesion or rash.     Comments: Stage 3 sacral ulcer wound bed red without any surrounding tissues erythema. Bilateral heel DTI noted foam dressing in  place with offloading boots in place.   Neurological:     Mental Status: She is alert. Mental status is at baseline.     Cranial Nerves: No cranial nerve deficit.     Sensory: No sensory deficit.     Motor: No weakness.     Coordination: Coordination normal.     Gait: Gait abnormal.  Psychiatric:  Mood and Affect: Mood normal.        Behavior: Behavior normal.        Thought Content: Thought content normal.        Judgment: Judgment normal.     Labs reviewed: Recent Labs    07/09/19 1219 07/09/19 1220 07/11/19 0323 07/11/19 0323 08/08/19 1341 08/08/19 1341 08/09/19 0507 08/09/19 0507 08/10/19 0138 08/12/19 0157 08/23/19 0000  NA  --    < > 139   < > 145   < > 146*   < > 146* 138 137  K  --    < > 3.9   < > 3.7   < > 3.7   < > 3.5 4.1 3.7  CL  --    < > 109   < > 110   < > 111   < > 110 108 99  CO2  --    < > 21*   < > 22   < > 22   < > 25 22 22   GLUCOSE  --    < > 134*   < > 148*   < > 99  --  131* 100*  --   BUN  --    < > 33*   < > 31*   < > 29*   < > 26* 17 22*  CREATININE  --    < > 1.24*   < > 1.47*   < > 1.15*   < > 0.92 0.84 0.8  CALCIUM  --    < > 8.2*   < > 8.9   < > 8.3*   < > 8.9 8.1* 8.8  MG 2.3  --  1.8  --  1.9  --   --   --   --   --   --   PHOS 3.5  --   --   --   --   --   --   --   --   --   --    < > = values in this interval not displayed.   Recent Labs    07/10/19 0642 07/11/19 0323 08/08/19 1341  AST 28 24 25   ALT 11 11 14   ALKPHOS 18* 18* 35*  BILITOT 0.7 0.6 0.9  PROT 5.3* 4.6* 6.4*  ALBUMIN 2.2* 1.9* 2.7*   Recent Labs    07/09/19 1220 07/10/19 0642 08/08/19 1341 08/10/19 0138 08/12/19 0157 08/12/19 0157 08/13/19 0119 08/16/19 0456 08/23/19 0000  WBC 10.5   < > 13.8*   < > 9.8   < > 9.4 8.7 9.3  NEUTROABS 8.1*  --  11.4*  --  6.3  --   --   --   --   HGB 11.9*   < > 12.4   < > 11.4*   < > 11.0* 11.2* 10.6*  HCT 37.8   < > 40.5   < > 35.0*   < > 33.9* 34.3* 32*  MCV 100.8*   < > 103.1*   < > 96.7  --  99.1 97.2  --     PLT 136*   < > 237   < > 229   < > 215 340 339   < > = values in this interval not displayed.   Lab Results  Component Value Date   TSH 2.731 07/09/2019   Lab Results  Component Value Date   HGBA1C 6.5 08/06/2016   Lab Results  Component Value Date  CHOL 128 07/05/2019   HDL 34.10 (L) 07/05/2019   LDLCALC 67 07/05/2019   LDLDIRECT 115.0 08/06/2016   TRIG 136.0 07/05/2019   CHOLHDL 4 07/05/2019    Significant Diagnostic Results in last 30 days:  No results found.  Assessment/Plan 1. Pressure injury of buttock, stage 3, unspecified laterality (HCC) Afebrile. Continue current wound care  - continue to follow up with wound care team.  - continue on Protein supplement and MVI - on pressure relieving mattress     2. Pressure injury, unstageable, with suspected deep tissue injury ([place]) Bilateral heel  Continue off loading boots - wound care team to continue to monitor  3. Hypothyroidism, unspecified type Lab Results  Component Value Date   TSH 2.731 07/09/2019  - continue on levothyroxine   4. Generalized weakness Continue to work with Therapy.   5. Dementia without behavioral disturbance, unspecified dementia type ([place]) At her baseline.No new behavioral issues reported. - continue on memantine 10 mg tablet twice daily  - continue on supportive care  Family/ staff Communication: Reviewed plan of care with patient and facility Nurse   Labs/tests ordered:None

## 2019-11-22 ENCOUNTER — Ambulatory Visit: Payer: Medicare Other

## 2019-11-30 ENCOUNTER — Non-Acute Institutional Stay (SKILLED_NURSING_FACILITY): Payer: Medicare Other | Admitting: Internal Medicine

## 2019-11-30 ENCOUNTER — Encounter: Payer: Self-pay | Admitting: Internal Medicine

## 2019-11-30 DIAGNOSIS — R627 Adult failure to thrive: Secondary | ICD-10-CM

## 2019-11-30 DIAGNOSIS — F0390 Unspecified dementia without behavioral disturbance: Secondary | ICD-10-CM

## 2019-11-30 DIAGNOSIS — E039 Hypothyroidism, unspecified: Secondary | ICD-10-CM

## 2019-11-30 DIAGNOSIS — L8995 Pressure ulcer of unspecified site, unstageable: Secondary | ICD-10-CM | POA: Diagnosis not present

## 2019-11-30 DIAGNOSIS — R531 Weakness: Secondary | ICD-10-CM

## 2019-11-30 DIAGNOSIS — I951 Orthostatic hypotension: Secondary | ICD-10-CM

## 2019-11-30 DIAGNOSIS — L89303 Pressure ulcer of unspecified buttock, stage 3: Secondary | ICD-10-CM

## 2019-11-30 NOTE — Progress Notes (Signed)
Location:  [place] Room Number: [place]:  SNF (31) Provider:  Larkyn Greenberger L. Mariea Clonts, D.O., C.M.D.  Gayland Curry, DO  Patient Care Team: Gayland Curry, DO as PCP - General (Geriatric Medicine) Marcial Pacas, MD as Consulting Physician (Neurology) Ladene Artist, MD as Consulting Physician (Gastroenterology) Madelin Rear, [facility] as Pharmacist (Pharmacist)  Extended Emergency Contact Information Primary Emergency Contact: Kristina Kristina [name] Address: [address]          [place], [place] [address] Johnnette Litter of [place] Phone: [phone] Mobile Phone: [phone] Relation: Daughter Secondary Emergency Contact: Vanessa Barbara Address: [address]          [place], [place] [address] Johnnette Litter of [place] Phone: [phone] Mobile Phone: [phone] Relation: Daughter  Code Status:  FULL Goals of care: Advanced Directive information Advanced Directives 11/30/2019  Does Patient Have a Medical Advance Directive? Yes  Type of Paramedic of Kristina Kristina [name];Living will  Does patient want to make changes to medical advance directive? No - Patient declined  Copy of [place] in Chart? Yes - validated most recent copy scanned in chart (See row information)  Would patient like information on creating a medical advance directive? -     Chief Complaint  Patient presents with  . Medical Management of Chronic Issues    Routine Visit     HPI:  Pt is a 86 y.o. Kristina [name] seen today for medical management of chronic diseases.  She is here for long-term care.  She has a h/o dementia, hypothyroidism, orthostatic syncope, and recently stage 3 pressure injury to her buttock and deep tissue injury to both heels.  She is here for long-term care under [facility].    Sweet lady seen resting sitting up in bed.  She reports that at times her bottom has hurt her but not recently.  She sleeps well, says appetite is good--she  eats what she can; however, she has lost 7 lbs over the past month looking at the epic flowsheet.  Per AHT:   11/28/2019 09:30 AM 143.2 lbs 26.19 Weight   10/26/2019 09:30 AM 150.4 lbs 27.51 Weight   09/14/2019 12:22 PM 148.2 lbs 27.1 Weight   09/05/2019 05:03 PM 147 lbs  26.88 Weight   09/03/2019 09:30 AM 146.8 lbs 26.85 Weight  She gets ensure bid with fair intake.  BIMS 6/15 with social worker 11/11.    She is alert and oriented to self and familiar staff, but not time.  She is able to request things she needs and requires help of one person for ADLs and transfers.  She wears 2L O2 via [facility] to breathe comfortably.  She denies sob.    She is to wear an abdominal binder when OOB due to severe orthostatic hypotension but often refuses it.    11/3, she tested positive for covid and had been in isolation.  She is now back in her regular room.  This may explain her weight loss, also.  Past Medical History:  Diagnosis Date  . Benign paroxysmal positional vertigo   . Diverticulosis   . Duodenal ulcer   . Endometrial cancer [facility])    age 34  . GERD (gastroesophageal reflux disease)   . Hypertension   . Hypothyroidism   . Memory loss   . Peripheral neuropathy   . 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 CATARACT EXTRACTION    . CHOLECYSTECTOMY    .  FOOT SURGERY     Right  . IR IVC FILTER PLMT / S&I /IMG GUID/MOD SED  08/10/2019  . KNEE SURGERY     Right  . VAGINAL HYSTERECTOMY      Allergies  Allergen Reactions  . Bee Venom Shortness Of Breath, Itching, Rash and Other (See Comments)    Almost passed out, also  . Penicillins Rash    Has patient had a PCN reaction causing immediate rash, facial/tongue/throat swelling, SOB or lightheadedness with hypotension: Yes Has patient had a PCN reaction causing severe rash involving mucus membranes or skin necrosis: No Has patient had a PCN reaction that required hospitalization:  Unknown Has patient had a PCN reaction occurring within the last 10 years: No If all of the above answers are "NO", then may proceed with Cephalosporin use.     Outpatient Encounter Medications as of 11/30/2019  Medication Sig  . apixaban (ELIQUIS) 5 MG TABS tablet Take 1 tablet (5 mg total) by mouth 2 (two) times daily.  . CVS D3 50 MCG (2000 UT) CAPS TAKE 1 CAPSULE DAILY  . diphenhydrAMINE (BENADRYL) 25 MG tablet Take 2 tablets (50 mg total) by mouth every 6 (six) hours as needed (hives or severe allergic reaction).  . feeding supplement, ENSURE ENLIVE, (ENSURE ENLIVE) LIQD Take 237 mLs by mouth 2 (two) times daily between meals.  . fluticasone (FLONASE) 50 MCG/ACT nasal spray Place 2 sprays into both nostrils daily as needed (for seasonal allergies). SPRAY 2 SPRAYS INTO EACH NOSTRIL EVERY DAY  . levothyroxine (SYNTHROID) 75 MCG tablet TAKE 1 TABLET BY MOUTH EVERY DAY  . loperamide (IMODIUM) 2 MG capsule Take 2 mg by mouth as needed for diarrhea or loose stools.   . memantine (NAMENDA) 10 MG tablet Take 1 tablet (10 mg total) by mouth 2 (two) times daily. TAKE 1 TABLET BY MOUTH TWICE A DAY.  . Multiple Vitamin (MULTIVITAMIN WITH MINERALS) TABS tablet Take 1 tablet by mouth daily.  . [DISCONTINUED] EPINEPHrine 0.3 mg/0.3 mL IJ SOAJ injection Inject 0.3 mLs (0.3 mg total) into the muscle once as needed for up to 1 dose (severe allergic reaction).   No facility-administered encounter medications on file as of 11/30/2019.    Review of Systems  Constitutional: Positive for weight loss. Negative for chills, fever and malaise/fatigue.  HENT: Negative for congestion and sore throat.   Eyes: Negative for blurred vision.  Respiratory: Negative for cough and shortness of breath.        Wears oxygen  Cardiovascular: Negative for chest pain, palpitations and leg swelling.  Gastrointestinal: Negative for abdominal pain, blood in stool, constipation and melena.  Genitourinary: Negative for dysuria.   Musculoskeletal: Negative for falls and joint pain.  Skin: Negative for rash.  Neurological: Negative for dizziness and loss of consciousness.  Psychiatric/Behavioral: Positive for memory loss. Negative for depression. The patient is not nervous/anxious and does not have insomnia.     Immunization History  Administered Date(s) Administered  . Fluad Quad(high Dose 65+) 11/07/2018  . Influenza, High Dose Seasonal PF 10/22/2015, 10/14/2016, 09/16/2017  . Influenza,inj,Quad PF,6+ Mos 10/30/2014  . PFIZER SARS-COV-2 Vaccination 03/11/2019, 04/11/2019  . Pneumococcal Conjugate-13 11/05/2015  . Pneumococcal Polysaccharide-23 01/06/2001, 10/22/2015, 08/05/2016  . Td 03/12/2003  . Zoster Recombinat (Shingrix) 08/06/2016   Pertinent  Health Maintenance Due  Topic Date Due  . INFLUENZA VACCINE  08/12/2019  . DEXA SCAN  Completed  . PNA vac Low Risk Adult  Completed   Fall Risk  07/05/2019 11/07/2018 11/07/2018 07/24/2018  09/16/2017  Falls in the past year? 1 1 1 1  No  Comment - - - - -  Number falls in past yr: 0 1 1 1  -  Injury with Fall? 0 0 0 1 -  Comment - - - knot on her head -  Risk for fall due to : Impaired balance/gait;Impaired mobility Impaired balance/gait;Impaired mobility Impaired balance/gait;Impaired mobility - -  Follow up Falls evaluation completed Falls evaluation completed Falls evaluation completed - -   Functional Status Survey:    Vitals:   11/30/19 1235  BP: 125/63  Pulse: (!) 58  Temp: (!) 97.1 F (36.2 C)  Weight: 150 lb 6.4 oz (68.2 kg)  Height: 5\' 2"  (1.575 m)   Body mass index is 27.51 kg/m. Physical Exam Vitals reviewed.  Constitutional:      General: She is not in acute distress.    Appearance: She is not toxic-appearing.  HENT:     Head: Normocephalic and atraumatic.     Right Ear: External ear normal.     Left Ear: External ear normal.  Cardiovascular:     Rate and Rhythm: Normal rate and regular rhythm.     Pulses: Normal pulses.     Heart  sounds: Normal heart sounds.  Pulmonary:     Effort: Pulmonary effort is normal.     Breath sounds: Normal breath sounds. No wheezing, rhonchi or rales.  Abdominal:     General: There is no distension.     Palpations: Abdomen is soft.     Tenderness: There is no abdominal tenderness. There is no guarding or rebound.  Musculoskeletal:        General: Normal range of motion.     Cervical back: Neck supple.     Right lower leg: No edema.     Left lower leg: No edema.  Skin:    General: Skin is warm and dry.     Coloration: Skin is pale.     Comments: Stage 3 pressure injury to buttock and DTI to heels  Neurological:     General: No focal deficit present.     Mental Status: She is alert. Mental status is at baseline.     Motor: Weakness present.     Gait: Gait abnormal.  Psychiatric:        Mood and Affect: Mood normal.        Behavior: Behavior normal.     Labs reviewed: Recent Labs    07/09/19 1219 07/09/19 1220 07/11/19 0323 07/11/19 0323 08/08/19 1341 08/08/19 1341 08/09/19 0507 08/09/19 0507 08/10/19 0138 08/12/19 0157 08/23/19 0000  NA  --    < > 139   < > 145   < > 146*   < > 146* 138 137  K  --    < > 3.9   < > 3.7   < > 3.7   < > 3.5 4.1 3.7  CL  --    < > 109   < > 110   < > 111   < > 110 108 99  CO2  --    < > 21*   < > 22   < > 22   < > 25 22 22   GLUCOSE  --    < > 134*   < > 148*   < > 99  --  131* 100*  --   BUN  --    < > 33*   < > 31*   < >  29*   < > 26* 17 22*  CREATININE  --    < > 1.24*   < > 1.47*   < > 1.15*   < > 0.92 0.84 0.8  CALCIUM  --    < > 8.2*   < > 8.9   < > 8.3*   < > 8.9 8.1* 8.8  MG 2.3  --  1.8  --  1.9  --   --   --   --   --   --   PHOS 3.5  --   --   --   --   --   --   --   --   --   --    < > = values in this interval not displayed.   Recent Labs    07/10/19 0642 07/11/19 0323 08/08/19 1341  AST 28 24 25   ALT 11 11 14   ALKPHOS 18* 18* 35*  BILITOT 0.7 0.6 0.9  PROT 5.3* 4.6* 6.4*  ALBUMIN 2.2* 1.9* 2.7*   Recent  Labs    07/09/19 1220 07/10/19 0642 08/08/19 1341 08/10/19 0138 08/12/19 0157 08/12/19 0157 08/13/19 0119 08/16/19 0456 08/23/19 0000  WBC 10.5   < > 13.8*   < > 9.8   < > 9.4 8.7 9.3  NEUTROABS 8.1*  --  11.4*  --  6.3  --   --   --   --   HGB 11.9*   < > 12.4   < > 11.4*   < > 11.0* 11.2* 10.6*  HCT 37.8   < > 40.5   < > 35.0*   < > 33.9* 34.3* 32*  MCV 100.8*   < > 103.1*   < > 96.7  --  99.1 97.2  --   PLT 136*   < > 237   < > 229   < > 215 340 339   < > = values in this interval not displayed.   Lab Results  Component Value Date   TSH 2.731 07/09/2019   Lab Results  Component Value Date   HGBA1C 6.5 08/06/2016   Lab Results  Component Value Date   CHOL 128 07/05/2019   HDL 34.10 (L) 07/05/2019   LDLCALC 67 07/05/2019   LDLDIRECT 115.0 08/06/2016   TRIG 136.0 07/05/2019   CHOLHDL 4 07/05/2019    Significant Diagnostic Results in last 30 days:  No results found.  Assessment/Plan 1. Pressure injury of buttock, stage 3, unspecified laterality ([place]) -cont low air loss mattress -cont wound care orders:  cleanse with NS, pat dry, apply medihoney, apply calcium alginate and cover with foam dressing QD as per wound care nurse -offload pressure with positional changes often -optimize nutrition -resume restorative nursing  2. Pressure injury, unstageable, with suspected deep tissue injury ([place]) -cont skin prep and foam dressings to heels and bunny boots if she'll keep them on  3. Hypothyroidism, unspecified type -cont levothyroxine 78mcg daily -f/u tsh at next routine visit (last was normal in June)  4. Dementia without behavioral disturbance, unspecified dementia type ([place]) -progressing gradually, pleasant without behaviors -FTT worse with weight loss amid covid infection -remains on namenda but is dependent in ADLs except to feed herself what she wants to eat  5. Failure to thrive in adult -lost weight over past month during covid infection and isolation -is  on supplement and had gained weight before losing this month  6. Generalized weakness -resume restorative nursing to help with this (d/cd  with covid isolation)  7. Orthostatic syncope -to use abdominal binder when up out of bed, but often refuses -no recent falls or syncope -had been on restorative before covid and this should be resumed   Family/ staff Communication: d/w snf nurse and notes reviewed; need to d/w family goals of care including code status and MOST form  Labs/tests ordered:  Will need TSH next month  Amani Marseille L. Jacqueleen Pulver, D.O. [facility] [address], [place] [address] Cell Phone (Mon-Fri 8am-5pm):  [phone] On Call:  [phone] & follow prompts after 5pm & weekends Office Phone:  [phone] Office Fax:  [phone]

## 2019-12-09 ENCOUNTER — Encounter: Payer: Self-pay | Admitting: Internal Medicine

## 2019-12-18 DIAGNOSIS — I10 Essential (primary) hypertension: Secondary | ICD-10-CM | POA: Diagnosis not present

## 2019-12-18 LAB — COMPREHENSIVE METABOLIC PANEL: Albumin: 2.3 — AB (ref 3.5–5.0)

## 2019-12-20 ENCOUNTER — Encounter: Payer: Self-pay | Admitting: Orthopedic Surgery

## 2019-12-20 ENCOUNTER — Non-Acute Institutional Stay (SKILLED_NURSING_FACILITY): Payer: Medicare Other | Admitting: Orthopedic Surgery

## 2019-12-20 DIAGNOSIS — L89300 Pressure ulcer of unspecified buttock, unstageable: Secondary | ICD-10-CM | POA: Diagnosis not present

## 2019-12-20 NOTE — Progress Notes (Signed)
Location:    [place] Room Number: 166/A Place of Service:  SNF [phone]) Provider: Windell Moulding NP   Gayland Curry, DO  Patient Care Team: Gayland Curry, DO as PCP - General (Geriatric Medicine) Marcial Pacas, MD as Consulting Physician (Neurology) Ladene Artist, MD as Consulting Physician (Gastroenterology) Madelin Rear, [facility] as Pharmacist (Pharmacist)  Extended Emergency Contact Information Primary Emergency Contact: Webb,Kristin Address: [address]          [place], [place] [address] Johnnette Litter of [place] Phone: [phone] Mobile Phone: [phone] Relation: Daughter Secondary Emergency Contact: Vanessa Barbara Address: [address]          [place], [place] [address] Johnnette Litter of [place] Phone: [phone] Mobile Phone: [phone] Relation: Daughter  Code Status:  Full Code Goals of care: Advanced Directive information Advanced Directives 12/20/2019  Does Patient Have a Medical Advance Directive? Yes  Type of Paramedic of [place];Living will  Does patient want to make changes to medical advance directive? No - Patient declined  Copy of [place] in Chart? Yes - validated most recent copy scanned in chart (See row information)  Would patient like information on creating a medical advance directive? -     Chief Complaint  Patient presents with  . Acute Visit    Wound Odor     HPI:  Pt is a 86 y.o. female seen today for an acute visit due pressure wound odor.   She is a resident of [facility], seen at beside today. PMH includes: hypertension, deep vein thrombosis, GERD, hypothyroidism, dementia, and pressure injury of the skin.   Today, facility wound care nurse states patients unstageable pressure injury to sacrum has increased necrotic tissue and odor. Wound measures 5.0 x 2.0 x 0.30. Current treatment includes: multivitamin daily, zinc sulfate 220  mg daily for 14 days, prostat BID, and ensure BID. In addition, she is on a pressure guard mattress. Pallative also consulted. Daily dressing changes include cleansing with saline, applying Santyl and covering with foam dressing.   Patient states daily dressing changes are painful. Asking for pain medication.   Facility nurse does not report any concerns, vitals stable.     Past Medical History:  Diagnosis Date  . Benign paroxysmal positional vertigo   . Diverticulosis   . Duodenal ulcer   . Endometrial cancer [facility])    age 79  . GERD (gastroesophageal reflux disease)   . Hypertension   . Hypothyroidism   . Memory loss   . Peripheral neuropathy   . 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 CATARACT EXTRACTION    . CHOLECYSTECTOMY    . FOOT SURGERY     Right  . IR IVC FILTER PLMT / S&I /IMG GUID/MOD SED  08/10/2019  . KNEE SURGERY     Right  . VAGINAL HYSTERECTOMY      Allergies  Allergen Reactions  . Bee Venom Shortness Of Breath, Itching, Rash and Other (See Comments)    Almost passed out, also  . Penicillins Rash    Has patient had a PCN reaction causing immediate rash, facial/tongue/throat swelling, SOB or lightheadedness with hypotension: Yes Has patient had a PCN reaction causing severe rash involving mucus membranes or skin necrosis: No Has patient had a PCN reaction that required hospitalization: Unknown Has patient had a PCN reaction occurring within the last 10 years: No  If all of the above answers are "NO", then may proceed with Cephalosporin use.     Allergies as of 12/20/2019      Reactions   Bee Venom Shortness Of Breath, Itching, Rash, Other (See Comments)   Almost passed out, also   Penicillins Rash   Has patient had a PCN reaction causing immediate rash, facial/tongue/throat swelling, SOB or lightheadedness with hypotension: Yes Has patient had a PCN reaction causing severe rash involving mucus  membranes or skin necrosis: No Has patient had a PCN reaction that required hospitalization: Unknown Has patient had a PCN reaction occurring within the last 10 years: No If all of the above answers are "NO", then may proceed with Cephalosporin use.      Medication List       Accurate as of December 20, 2019 10:55 AM. If you have any questions, ask your nurse or doctor.        STOP taking these medications   apixaban 5 MG Tabs tablet Commonly known as: ELIQUIS Stopped by: Yvonna Alanis, NP     TAKE these medications   CVS D3 50 MCG (2000 UT) Caps Generic drug: Cholecalciferol TAKE 1 CAPSULE DAILY   diphenhydrAMINE 25 MG tablet Commonly known as: BENADRYL Take 2 tablets (50 mg total) by mouth every 6 (six) hours as needed (hives or severe allergic reaction).   EPINEPHrine 0.3 mg/0.3 mL Soaj injection Commonly known as: EPI-PEN Inject 0.3 mg into the muscle as needed for anaphylaxis. INJECT 0.3 mLs (0.3 MG) INTO THE MUSCLE ONCE AS NEEDED FOR UP TO 1 DOSE (SEVERE ALLERGIC REACTION)   feeding supplement (PRO-STAT 64) Liqd Take 30 mLs by mouth in the morning and at bedtime.   feeding supplement Liqd Take 237 mLs by mouth 2 (two) times daily between meals.   nutrition supplement (JUVEN) Pack Take 1 packet by mouth 2 (two) times daily between meals. To promote wound healing   fluticasone 50 MCG/ACT nasal spray Commonly known as: FLONASE Place 2 sprays into both nostrils daily as needed (for seasonal allergies). SPRAY 2 SPRAYS INTO EACH NOSTRIL EVERY DAY   levothyroxine 75 MCG tablet Commonly known as: SYNTHROID TAKE 1 TABLET BY MOUTH EVERY DAY   loperamide 2 MG capsule Commonly known as: IMODIUM Take 2 mg by mouth as needed for diarrhea or loose stools.   memantine 10 MG tablet Commonly known as: NAMENDA Take 1 tablet (10 mg total) by mouth 2 (two) times daily. TAKE 1 TABLET BY MOUTH TWICE A DAY.   multivitamin with minerals Tabs tablet Take 1 tablet by mouth daily.    zinc sulfate 220 (50 Zn) MG capsule Take 220 mg by mouth daily.       Review of Systems  Constitutional: Negative for activity change, appetite change and fever.  Respiratory: Negative for cough and shortness of breath.   Cardiovascular: Negative for chest pain and leg swelling.  Skin: Positive for wound.       Sacral and heel ulcer  Psychiatric/Behavioral: Positive for confusion. Negative for dysphoric mood and sleep disturbance. The patient is not nervous/anxious.     Immunization History  Administered Date(s) Administered  . Fluad Quad(high Dose 65+) 11/07/2018  . Influenza, High Dose Seasonal PF 10/22/2015, 10/14/2016, 09/16/2017  . Influenza,inj,Quad PF,6+ Mos 10/30/2014  . PFIZER SARS-COV-2 Vaccination 03/11/2019, 04/11/2019  . Pneumococcal Conjugate-13 11/05/2015  . Pneumococcal Polysaccharide-23 01/06/2001, 10/22/2015, 08/05/2016  . Td 03/12/2003  . Zoster Recombinat (Shingrix) 08/06/2016   Pertinent  Health Maintenance Due  Topic  Date Due  . INFLUENZA VACCINE  08/12/2019  . DEXA SCAN  Completed  . PNA vac Low Risk Adult  Completed   Fall Risk  07/05/2019 11/07/2018 11/07/2018 07/24/2018 09/16/2017  Falls in the past year? 1 1 1 1  No  Comment - - - - -  Number falls in past yr: 0 1 1 1  -  Injury with Fall? 0 0 0 1 -  Comment - - - knot on her head -  Risk for fall due to : Impaired balance/gait;Impaired mobility Impaired balance/gait;Impaired mobility Impaired balance/gait;Impaired mobility - -  Follow up Falls evaluation completed Falls evaluation completed Falls evaluation completed - -   Functional Status Survey:    Vitals:   12/20/19 1042  BP: 124/68  Pulse: 70  Resp: 16  Temp: 98 F (36.7 C)  Weight: 143 lb 3.2 oz (65 kg)  Height: 5\' 2"  (1.575 m)   Body mass index is 26.19 kg/m. Physical Exam Vitals reviewed.  Constitutional:      General: She is not in acute distress.    Appearance: Normal appearance.  Cardiovascular:     Rate and Rhythm:  Normal rate and regular rhythm.     Pulses: Normal pulses.     Heart sounds: Normal heart sounds. No murmur heard.   Pulmonary:     Effort: Pulmonary effort is normal. No respiratory distress.     Breath sounds: Normal breath sounds. No wheezing.  Skin:    Comments: Necrotic tissue present in wound bed of sacral injury. Foul odor present. Drainage brown. Surrounding tissue CDI.   Neurological:     Mental Status: She is alert. Mental status is at baseline.     Motor: Weakness present.     Gait: Gait abnormal.  Psychiatric:        Mood and Affect: Mood normal.        Behavior: Behavior normal.        Cognition and Memory: Memory is impaired.     Labs reviewed: Recent Labs    07/09/19 1219 07/09/19 1220 07/11/19 0323 08/08/19 1341 08/09/19 0507 08/10/19 0138 08/12/19 0157 08/23/19 0000  NA  --    < > 139 145 146* 146* 138 137  K  --    < > 3.9 3.7 3.7 3.5 4.1 3.7  CL  --    < > 109 110 111 110 108 99  CO2  --    < > 21* 22 22 25 22 22   GLUCOSE  --    < > 134* 148* 99 131* 100*  --   BUN  --    < > 33* 31* 29* 26* 17 22*  CREATININE  --    < > 1.24* 1.47* 1.15* 0.92 0.84 0.8  CALCIUM  --    < > 8.2* 8.9 8.3* 8.9 8.1* 8.8  MG 2.3  --  1.8 1.9  --   --   --   --   PHOS 3.5  --   --   --   --   --   --   --    < > = values in this interval not displayed.   Recent Labs    07/10/19 0642 07/11/19 0323 08/08/19 1341  AST 28 24 25   ALT 11 11 14   ALKPHOS 18* 18* 35*  BILITOT 0.7 0.6 0.9  PROT 5.3* 4.6* 6.4*  ALBUMIN 2.2* 1.9* 2.7*   Recent Labs    07/09/19 1220 07/10/19 9417 08/08/19 1341 08/10/19 0138  08/12/19 0157 08/13/19 0119 08/16/19 0456 08/23/19 0000  WBC 10.5   < > 13.8*   < > 9.8 9.4 8.7 9.3  NEUTROABS 8.1*  --  11.4*  --  6.3  --   --   --   HGB 11.9*   < > 12.4   < > 11.4* 11.0* 11.2* 10.6*  HCT 37.8   < > 40.5   < > 35.0* 33.9* 34.3* 32*  MCV 100.8*   < > 103.1*   < > 96.7 99.1 97.2  --   PLT 136*   < > 237   < > 229 215 340 339   < > = values in  this interval not displayed.   Lab Results  Component Value Date   TSH 2.731 07/09/2019   Lab Results  Component Value Date   HGBA1C 6.5 08/06/2016   Lab Results  Component Value Date   CHOL 128 07/05/2019   HDL 34.10 (L) 07/05/2019   LDLCALC 67 07/05/2019   LDLDIRECT 115.0 08/06/2016   TRIG 136.0 07/05/2019   CHOLHDL 4 07/05/2019    Significant Diagnostic Results in last 30 days:  No results found.  Assessment/Plan 1. Pressure injury of buttock, unstageable, unspecified laterality ([place]) - wound bed present for necrotic tissue, foul odor present - suspect infection - vitals stable, afebrile - start doxycycline 100 mg PO BID x 10 days -start tylenol 1000 mg PO BID for pain associated with daily wound care - continue current wound care interventions - cbc/diff    Family/ staff Communication: Plan discussed with patient and facility nurse  Labs/tests ordered:  Cbc/diff

## 2019-12-21 ENCOUNTER — Encounter: Payer: Self-pay | Admitting: Internal Medicine

## 2019-12-21 ENCOUNTER — Non-Acute Institutional Stay (SKILLED_NURSING_FACILITY): Payer: Medicare Other | Admitting: Internal Medicine

## 2019-12-21 DIAGNOSIS — L89300 Pressure ulcer of unspecified buttock, unstageable: Secondary | ICD-10-CM

## 2019-12-21 DIAGNOSIS — F0390 Unspecified dementia without behavioral disturbance: Secondary | ICD-10-CM | POA: Diagnosis not present

## 2019-12-21 DIAGNOSIS — R627 Adult failure to thrive: Secondary | ICD-10-CM | POA: Diagnosis not present

## 2019-12-21 DIAGNOSIS — L8995 Pressure ulcer of unspecified site, unstageable: Secondary | ICD-10-CM | POA: Diagnosis not present

## 2019-12-21 DIAGNOSIS — Z7189 Other specified counseling: Secondary | ICD-10-CM

## 2019-12-21 DIAGNOSIS — R531 Weakness: Secondary | ICD-10-CM

## 2019-12-21 DIAGNOSIS — I10 Essential (primary) hypertension: Secondary | ICD-10-CM | POA: Diagnosis not present

## 2019-12-21 LAB — CBC AND DIFFERENTIAL
HCT: 31 — AB (ref 36–46)
Hemoglobin: 9.9 — AB (ref 12.0–16.0)
Platelets: 451 — AB (ref 150–399)
WBC: 23.7

## 2019-12-21 LAB — CBC: RBC: 3.45 — AB (ref 3.87–5.11)

## 2019-12-21 MED ORDER — DOXYCYCLINE HYCLATE 100 MG PO TABS: 100.0000 mg | ORAL_TABLET | Freq: Two times a day (BID) | ORAL | 0 refills | Status: AC

## 2019-12-21 NOTE — Progress Notes (Signed)
Location:  [facility] Room Number: [place]:  SNF (31) Provider:  Kiasha Bellin L. Mariea Clonts, D.O., C.M.D.  Gayland Curry, DO  Patient Care Team: Gayland Curry, DO as PCP - General (Geriatric Medicine) Marcial Pacas, MD as Consulting Physician (Neurology) Ladene Artist, MD as Consulting Physician (Gastroenterology) Madelin Rear, [facility] as Pharmacist (Pharmacist)  Extended Emergency Contact Information Primary Emergency Contact: Webb,Kristin Address: [address]          [place],  [address] [name] Phone: [phone] Mobile Phone: [phone] Relation: Daughter Secondary Emergency Contact: Vanessa Barbara Address: [address]          [place],  [address] [name] Phone: [phone] Mobile Phone: [phone] Relation: Daughter  Code Status:  FULL CODE Goals of care: Advanced Directive information Advanced Directives 12/21/2019  Does Patient Have a Medical Advance Directive? Yes  Type of Paramedic of [place];Living will  Does patient want to make changes to medical advance directive? No - Patient declined  Copy of [place] in Chart? Yes - validated most recent copy scanned in chart (See row information)  Would patient like information on creating a medical advance directive? -   Chief Complaint  Patient presents with  . Acute Visit    Wound infection and generalized decline of health since covid.     HPI:  Pt is a 86 y.o. female seen today for an acute visit for overall decline with poor po intake, weight loss, decreased daily functions, progressive dementia and progression of her sacral and heel pressure injuries since her covid infection.    Ever since her couple of hospital stays, she's had a decline over the past few years, too.  Was independent 5 yrs ago.  Over the past year, 8 mos, she's been declining more.  When she first went to  neurology, they were educated about her dementia.  She had gotten weaker before the hospital stay.  They knew she was not moving on her own before admission.  Only able to do a little with PT.  She says unfortunately it's part of her dementia decline.    She still remembers [name], her brother. They are trying to visit regularly.  She may not have known her yesterday in the dining room.     They don't want her to live as a vegetable or be brain dead.  They want her to go as peacefully as possible and not to be suffering.  We reviewed some of her living will and HCPOA form.  Discussed how she noted she did not want artificial nutrition and hydration.  [name] expressed concerns about how she might be starving or dehydrated or we did not pursue this with her intake being poor.  I explained the physiologic changes of end of life with dementia and how patients are not having those feelings.  She expressed understanding.    We discussed setting up an in-person meeting here with her brother also in attendance and I'm thinking we could complete a MOST form at that time.  She will get back with social work about this.    Past Medical History:  Diagnosis Date  . Benign paroxysmal positional vertigo   . Diverticulosis   . Duodenal ulcer   . Endometrial cancer [facility])    age 16  . GERD (gastroesophageal reflux disease)   . Hypertension   . Hypothyroidism   . Memory loss   .  Peripheral neuropathy   . 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 CATARACT EXTRACTION    . CHOLECYSTECTOMY    . FOOT SURGERY     Right  . IR IVC FILTER PLMT / S&I /IMG GUID/MOD SED  08/10/2019  . KNEE SURGERY     Right  . VAGINAL HYSTERECTOMY      Allergies  Allergen Reactions  . Bee Venom Shortness Of Breath, Itching, Rash and Other (See Comments)    Almost passed out, also  . Penicillins Rash    Has patient had a PCN reaction causing immediate rash,  facial/tongue/throat swelling, SOB or lightheadedness with hypotension: Yes Has patient had a PCN reaction causing severe rash involving mucus membranes or skin necrosis: No Has patient had a PCN reaction that required hospitalization: Unknown Has patient had a PCN reaction occurring within the last 10 years: No If all of the above answers are "NO", then may proceed with Cephalosporin use.     Outpatient Encounter Medications as of 12/21/2019  Medication Sig  . Amino Acids-Protein Hydrolys (FEEDING SUPPLEMENT, PRO-STAT 64,) LIQD Take 30 mLs by mouth in the morning and at bedtime.  . CVS D3 50 MCG (2000 UT) CAPS TAKE 1 CAPSULE DAILY  . diphenhydrAMINE (BENADRYL) 25 MG tablet Take 2 tablets (50 mg total) by mouth every 6 (six) hours as needed (hives or severe allergic reaction).  Marland Kitchen EPINEPHrine 0.3 mg/0.3 mL IJ SOAJ injection Inject 0.3 mg into the muscle as needed for anaphylaxis. INJECT 0.3 mLs (0.3 MG) INTO THE MUSCLE ONCE AS NEEDED FOR UP TO 1 DOSE (SEVERE ALLERGIC REACTION)  . feeding supplement, ENSURE ENLIVE, (ENSURE ENLIVE) LIQD Take 237 mLs by mouth 2 (two) times daily between meals.  . fluticasone (FLONASE) 50 MCG/ACT nasal spray Place 2 sprays into both nostrils daily as needed (for seasonal allergies). SPRAY 2 SPRAYS INTO EACH NOSTRIL EVERY DAY  . levothyroxine (SYNTHROID) 75 MCG tablet TAKE 1 TABLET BY MOUTH EVERY DAY  . loperamide (IMODIUM) 2 MG capsule Take 2 mg by mouth as needed for diarrhea or loose stools.   . memantine (NAMENDA) 10 MG tablet Take 1 tablet (10 mg total) by mouth 2 (two) times daily. TAKE 1 TABLET BY MOUTH TWICE A DAY.  . Multiple Vitamin (MULTIVITAMIN WITH MINERALS) TABS tablet Take 1 tablet by mouth daily.  . nutrition supplement, JUVEN, (JUVEN) PACK Take 1 packet by mouth 2 (two) times daily between meals. To promote wound healing  . zinc sulfate 220 (50 Zn) MG capsule Take 220 mg by mouth daily.   No facility-administered encounter medications on file as of  12/21/2019.    Review of Systems  Constitutional: Negative for chills, fever and malaise/fatigue.  HENT: Negative for congestion and sore throat.   Eyes: Negative for blurred vision.  Respiratory: Negative for cough and shortness of breath.   Cardiovascular: Positive for leg swelling. Negative for chest pain and palpitations.  Gastrointestinal: Negative for abdominal pain and constipation.  Genitourinary: Negative for dysuria.  Musculoskeletal: Negative for falls.  Neurological: Negative for dizziness and loss of consciousness.  Psychiatric/Behavioral: Positive for memory loss. Negative for depression. The patient is not nervous/anxious and does not have insomnia.     Immunization History  Administered Date(s) Administered  . Fluad Quad(high Dose 65+) 11/07/2018  . Influenza, High Dose Seasonal PF 10/22/2015, 10/14/2016, 09/16/2017  . Influenza,inj,Quad PF,6+ Mos 10/30/2014  . PFIZER SARS-COV-2 Vaccination 03/11/2019, 04/11/2019  . Pneumococcal Conjugate-13 11/05/2015  .  Pneumococcal Polysaccharide-23 01/06/2001, 10/22/2015, 08/05/2016  . Td 03/12/2003  . Zoster Recombinat (Shingrix) 08/06/2016   Pertinent  Health Maintenance Due  Topic Date Due  . INFLUENZA VACCINE  08/12/2019  . DEXA SCAN  Completed  . PNA vac Low Risk Adult  Completed   Fall Risk  07/05/2019 11/07/2018 11/07/2018 07/24/2018 09/16/2017  Falls in the past year? 1 1 1 1  No  Comment - - - - -  Number falls in past yr: 0 1 1 1  -  Injury with Fall? 0 0 0 1 -  Comment - - - knot on her head -  Risk for fall due to : Impaired balance/gait;Impaired mobility Impaired balance/gait;Impaired mobility Impaired balance/gait;Impaired mobility - -  Follow up Falls evaluation completed Falls evaluation completed Falls evaluation completed - -   Functional Status Survey:    Vitals:   12/21/19 1557  BP: 109/80  Pulse: 63  Temp: (!) 97.1 F (36.2 C)  Weight: 143 lb 3.2 oz (65 kg)  Height: 5\' 2"  (1.575 m)   Body mass  index is 26.19 kg/m. Physical Exam Constitutional:      Appearance: She is normal weight. She is ill-appearing. She is not toxic-appearing.  HENT:     Head: Normocephalic and atraumatic.  Cardiovascular:     Rate and Rhythm: Normal rate and regular rhythm.     Heart sounds: No murmur heard.   Pulmonary:     Effort: Pulmonary effort is normal.     Breath sounds: Normal breath sounds.  Abdominal:     General: Bowel sounds are normal.     Palpations: Abdomen is soft.  Skin:    Comments: "DEEP TISSUE PRESSURE INJURY TO LEFT MEDIAL HEEL: measures 5.0 x 3.5 x 0.2cm, deep purple/maroon discoloration to intact skin. No exudate noted. No indications of infection noted. Periwound intact. No periwound maceration noted. Denies pain/discomfort. Lupita Leash, PA-C [facility], in facility on wound rounds 12/17/2019, wound assessed. Order in place to apply skin prep, cover with foam heel cup and wrap with kerlix QD. Order in place for prevalon boots to bilateral feet qd, while in bed, may remove for AM/PM care. Specialty air mattress in place. Orders in place for Zinc, Ensure, Multivitamin, and Prostat will aide in wound healing. RR, Garret Reddish [phone] updated and agrees with treatment plan of care. Dr. Mariea Clonts updated in facility.Lupita Leash, PA-C [facility], in facility on wound rounds 12/17/2019, note states "Ms. Denley 88YOWF with sig PMHx muscle weakness, unsteadiness on feet, abnormalities of gait and mobility (LE contracture), CKD3, Covid 19, hypothyroidism, polyneuropathy, HLD, and dementia who is being seen for wound care. Recurrent left heel DTPI: note patient has had impaired mobility and contractures post Covid 19 diagnosis, she is working with therapy. No wound drainage. No wound odor. Will treat with skin prep + dry dressing Qday + offloading boots. Sacrum wound: larger vs last week due overall health deterioration. Recommend updated albumin and total protein labs to assess nutritional status +wound odor. No  wound pain. Periwound intact. no periwound maceration +necrotic tissue. Wound was debrided using a NO 15 blade after application of hurricane spray. No complications with procedure...no acute signs of infection. Will change treatment to moistened dakins gauze packing for wound odor and bioburden control + dry dressing + air mattress + offloading and frequent repositioning. Supplements: ensure enlive BID. Treatment discussed with facility treatment team. she is working with therapy team. will follow up in one week." "UNSTAGEABLE PRESSURE INJURY TO SACRUM: measures 5.5 x 6.0 x 1.0cm,  moderate amount of serous exudate. Periwound intact. No periwound maceration noted. No indications of pain/discomfort. Lupita Leash, PA-C [facility], in facility on wound rounds 12/17/2019, wound assessed. Order in place to cleanse with NS, pat dry, pack with dakins moistened gauze and cover with foam dressing QD. Specialty air mattress in place. Order in place for prevalon boots to bilateral feet qd, while in bed, may remove for AM/PM care. Orders in place for Zinc, Ensure, Multivitamin, and Prostat will aide in wound healing. RR, Garret Reddish [phone] updated and agrees with treatment plan of care. Dr. Mariea Clonts updated in facility."; some odor notable in room from wound despite earlier wound care and dakins  Neurological:     Motor: Weakness present.     Gait: Gait abnormal.     Comments: Disoriented but very pleasant and socially appropriate with me, sweet and grabbed my hand  Psychiatric:        Mood and Affect: Mood normal.     Labs reviewed: Recent Labs    07/09/19 1219 07/09/19 1220 07/11/19 0323 08/08/19 1341 08/09/19 0507 08/10/19 0138 08/12/19 0157 08/23/19 0000  NA  --    < > 139 145 146* 146* 138 137  K  --    < > 3.9 3.7 3.7 3.5 4.1 3.7  CL  --    < > 109 110 111 110 108 99  CO2  --    < > 21* 22 22 25 22 22   GLUCOSE  --    < > 134* 148* 99 131* 100*  --   BUN  --    < > 33* 31* 29* 26* 17 22*  CREATININE   --    < > 1.24* 1.47* 1.15* 0.92 0.84 0.8  CALCIUM  --    < > 8.2* 8.9 8.3* 8.9 8.1* 8.8  MG 2.3  --  1.8 1.9  --   --   --   --   PHOS 3.5  --   --   --   --   --   --   --    < > = values in this interval not displayed.   Recent Labs    07/10/19 0642 07/11/19 0323 08/08/19 1341  AST 28 24 25   ALT 11 11 14   ALKPHOS 18* 18* 35*  BILITOT 0.7 0.6 0.9  PROT 5.3* 4.6* 6.4*  ALBUMIN 2.2* 1.9* 2.7*   Recent Labs    07/09/19 1220 07/10/19 0642 08/08/19 1341 08/10/19 0138 08/12/19 0157 08/13/19 0119 08/16/19 0456 08/23/19 0000  WBC 10.5   < > 13.8*   < > 9.8 9.4 8.7 9.3  NEUTROABS 8.1*  --  11.4*  --  6.3  --   --   --   HGB 11.9*   < > 12.4   < > 11.4* 11.0* 11.2* 10.6*  HCT 37.8   < > 40.5   < > 35.0* 33.9* 34.3* 32*  MCV 100.8*   < > 103.1*   < > 96.7 99.1 97.2  --   PLT 136*   < > 237   < > 229 215 340 339   < > = values in this interval not displayed.   Lab Results  Component Value Date   TSH 2.731 07/09/2019   Lab Results  Component Value Date   HGBA1C 6.5 08/06/2016   Lab Results  Component Value Date   CHOL 128 07/05/2019   HDL 34.10 (L) 07/05/2019   LDLCALC 67 07/05/2019   LDLDIRECT  115.0 08/06/2016   TRIG 136.0 07/05/2019   CHOLHDL 4 07/05/2019    Significant Diagnostic Results in last 30 days:  No results found.  Assessment/Plan 1. Pressure injury of buttock, unstageable, unspecified laterality ([place]) -now with infection, cont doxy to complete full 10 day course, dakins for odor, wound care, mattress, turning/positioning, nutritional support  2. Pressure injury, unstageable, with suspected deep tissue injury ([place]) -appears this may progress more by next week despite interventions put into place unfortunately -check prealbumin   3. Dementia without behavioral disturbance, unspecified dementia type ([place]) -had been gradually progressive until past several months and now staff and I note more progression since covid isolation and release -educated  [name] about this today and we're going to set up a meeting next week  4. Failure to thrive in adult -progressive, cont nutritional support as she will take, but no tube feeding or IVFs due to her wishes on her living will  5. Generalized weakness -requiring help with all activities at this point due to weakness and lift use  6. ACP (advance care planning) -18 mins spent discussing resident's decline, poor intake, progressive wounds and now wound infection of buttocks, educated on end stages of dementia and mentioned hospice care as an option for her to pass at home, but [name] noted that pt thinks she is now home and that's not really a physically possible option now  Family/ staff Communication: as in ACP  Labs/tests ordered:  Bmp, prealbumin next draw  [facility]. Alnisa Hasley, D.O. [facility] [address], [place] [address] Cell Phone (Mon-Fri 8am-5pm):  [phone] On Call:  [phone] & follow prompts after 5pm & weekends Office Phone:  [phone] Office Fax:  [phone]

## 2019-12-24 DIAGNOSIS — M461 Sacroiliitis, not elsewhere classified: Secondary | ICD-10-CM | POA: Diagnosis not present

## 2019-12-24 DIAGNOSIS — I10 Essential (primary) hypertension: Secondary | ICD-10-CM | POA: Diagnosis not present

## 2019-12-24 LAB — COMPREHENSIVE METABOLIC PANEL
Calcium: 8 — AB (ref 8.7–10.7)
GFR calc Af Amer: 83.88
GFR calc non Af Amer: 72.37

## 2019-12-24 LAB — BASIC METABOLIC PANEL
BUN: 28 — AB (ref 4–21)
CO2: 25 — AB (ref 13–22)
Chloride: 99 (ref 99–108)
Creatinine: 0.8 (ref 0.5–1.1)
Glucose: 115
Potassium: 3.8 (ref 3.4–5.3)
Sodium: 136 — AB (ref 137–147)

## 2019-12-27 ENCOUNTER — Encounter: Payer: Self-pay | Admitting: Orthopedic Surgery

## 2019-12-27 ENCOUNTER — Non-Acute Institutional Stay (SKILLED_NURSING_FACILITY): Payer: Medicare Other | Admitting: Orthopedic Surgery

## 2019-12-27 DIAGNOSIS — R531 Weakness: Secondary | ICD-10-CM

## 2019-12-27 DIAGNOSIS — F0390 Unspecified dementia without behavioral disturbance: Secondary | ICD-10-CM | POA: Diagnosis not present

## 2019-12-27 DIAGNOSIS — L8995 Pressure ulcer of unspecified site, unstageable: Secondary | ICD-10-CM | POA: Diagnosis not present

## 2019-12-27 DIAGNOSIS — E039 Hypothyroidism, unspecified: Secondary | ICD-10-CM

## 2019-12-27 DIAGNOSIS — R627 Adult failure to thrive: Secondary | ICD-10-CM

## 2019-12-27 NOTE — Progress Notes (Signed)
Location:    [facility] Room Number: 166/A Place of Service:  SNF [phone]) Provider:  Windell Moulding NP  Gayland Curry, DO  Patient Care Team: Gayland Curry, DO as PCP - General (Geriatric Medicine) Marcial Pacas, MD as Consulting Physician (Neurology) Ladene Artist, MD as Consulting Physician (Gastroenterology) Madelin Rear, [facility] as Pharmacist (Pharmacist)  Extended Emergency Contact Information Primary Emergency Contact: Webb,Kristin Address: [address]          [place], [place] [address] Johnnette Litter of [place] Phone: [phone] Mobile Phone: [phone] Relation: Daughter Secondary Emergency Contact: Vanessa Barbara Address: [address]          [place], [place] [address] Johnnette Litter of [place] Phone: [phone] Mobile Phone: [phone] Relation: Daughter  Code Status: Full Code  Goals of care: Advanced Directive information Advanced Directives 12/27/2019  Does Patient Have a Medical Advance Directive? Yes  Type of Paramedic of [place];Living will  Does patient want to make changes to medical advance directive? No - Patient declined  Copy of [place] in Chart? Yes - validated most recent copy scanned in chart (See row information)  Would patient like information on creating a medical advance directive? -     Chief Complaint  Patient presents with  . Medical Management of Chronic Issues    Routine Visit of Medical Management   . Immunizations    T-Dap, Covid Vaccine Booster     HPI:  Pt is a 86 y.o. female seen today for medical management of chronic diseases.    She is a resident of [facility], seen at bedside today. PMH includes: hypertension, DVT, GERD, hypothyroidism, dementia, pressure injury to buttocks and heel, generalized weakness and failure to thrive.   Wound nurse present during encounter.   She is alert to self and person.  Disorientated to time, knows she is in [place] but does not know where. Can follow simple commands and express needs.   She is being followed by facility wound nurse for unstageable pressure injury of sacrum and stage 2 pressure wound of heel. Last week her sacral wound developed necrotic tissue with odor. Suspected osteomyelitis, x-ray inconclusive. Docycline started. Family has had discussions with Dr. Mariea Clonts about prognosis and advanced care planning.   Today, sacral wound measures 6.0 x 6.0 x 1.5. Daily dressing changes are cleansing with saline, packing with dakins soaked gauze, covering with foam dressing. Right heel stage II measures 6.0 x 4.0 x 2.0. Daily dressing change consists of applying skin prep and covering with foam heel cup. I was present during dressing changes today. Patient tolerated procedure well.   Her appetite continues to be poor with progressive weight loss. Currently taking ensure, prostat, and juven to promote wound healing and increase calorie intake. Today I tried to help her eat her breakfast, but she refused. Would not drink milk, only took a few sips of water for me. Recent weights are as follows: 12/15- 140.2 lbs        11/17- 143.2 lbs        10/15- 150.4 lbs  Recent blood pressures are as follows:   12/16- 122/76  12/15- 123/73  12/14- 126/63  No recent falls reported. No behavioral outbursts reported.   Facility nurse does not report any concerns, vital signs stable.    Past Medical History:  Diagnosis Date  . Benign paroxysmal positional vertigo   . Diverticulosis   . Duodenal  ulcer   . Endometrial cancer [facility])    age 86  . GERD (gastroesophageal reflux disease)   . Hypertension   . Hypothyroidism   . Memory loss   . Peripheral neuropathy   . 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 CATARACT EXTRACTION    . CHOLECYSTECTOMY    . FOOT SURGERY     Right  . IR IVC FILTER PLMT /  S&I /IMG GUID/MOD SED  08/10/2019  . KNEE SURGERY     Right  . VAGINAL HYSTERECTOMY      Allergies  Allergen Reactions  . Bee Venom Shortness Of Breath, Itching, Rash and Other (See Comments)    Almost passed out, also  . Penicillins Rash    Has patient had a PCN reaction causing immediate rash, facial/tongue/throat swelling, SOB or lightheadedness with hypotension: Yes Has patient had a PCN reaction causing severe rash involving mucus membranes or skin necrosis: No Has patient had a PCN reaction that required hospitalization: Unknown Has patient had a PCN reaction occurring within the last 10 years: No If all of the above answers are "NO", then may proceed with Cephalosporin use.     Allergies as of 12/27/2019      Reactions   Bee Venom Shortness Of Breath, Itching, Rash, Other (See Comments)   Almost passed out, also   Penicillins Rash   Has patient had a PCN reaction causing immediate rash, facial/tongue/throat swelling, SOB or lightheadedness with hypotension: Yes Has patient had a PCN reaction causing severe rash involving mucus membranes or skin necrosis: No Has patient had a PCN reaction that required hospitalization: Unknown Has patient had a PCN reaction occurring within the last 10 years: No If all of the above answers are "NO", then may proceed with Cephalosporin use.      Medication List       Accurate as of December 27, 2019 10:14 AM. If you have any questions, ask your nurse or doctor.        acetaminophen 500 MG tablet Commonly known as: TYLENOL Take 500 mg by mouth in the morning and at bedtime.   [employer] D3 50 MCG (2000 UT) Caps Generic drug: Cholecalciferol TAKE 1 CAPSULE DAILY   diphenhydrAMINE 25 MG tablet Commonly known as: BENADRYL Take 2 tablets (50 mg total) by mouth every 6 (six) hours as needed (hives or severe allergic reaction).   doxycycline 100 MG tablet Commonly known as: VIBRA-TABS Take 1 tablet (100 mg total) by mouth 2 (two) times daily  for 10 days.   EPINEPHrine 0.3 mg/0.3 mL Soaj injection Commonly known as: EPI-PEN Inject 0.3 mg into the muscle as needed for anaphylaxis. INJECT 0.3 mLs (0.3 MG) INTO THE MUSCLE ONCE AS NEEDED FOR UP TO 1 DOSE (SEVERE ALLERGIC REACTION)   feeding supplement (PRO-STAT 64) Liqd Take 30 mLs by mouth in the morning and at bedtime.   feeding supplement Liqd Take 237 mLs by mouth 2 (two) times daily between meals.   nutrition supplement (JUVEN) Pack Take 1 packet by mouth 2 (two) times daily between meals. To promote wound healing   fluticasone 50 MCG/ACT nasal spray Commonly known as: FLONASE Place 2 sprays into both nostrils daily as needed (for seasonal allergies). SPRAY 2 SPRAYS INTO EACH NOSTRIL EVERY DAY   levothyroxine 75 MCG tablet Commonly known as: SYNTHROID TAKE 1 TABLET BY MOUTH EVERY DAY   loperamide 2 MG capsule Commonly known as: IMODIUM Take 2 mg  by mouth as needed for diarrhea or loose stools.   memantine 10 MG tablet Commonly known as: NAMENDA Take 1 tablet (10 mg total) by mouth 2 (two) times daily. TAKE 1 TABLET BY MOUTH TWICE A DAY.   multivitamin with minerals Tabs tablet Take 1 tablet by mouth daily.       Review of Systems  Unable to perform ROS: Dementia    Immunization History  Administered Date(s) Administered  . Fluad Quad(high Dose 65+) 11/07/2018  . Influenza, High Dose Seasonal PF 10/22/2015, 10/14/2016, 09/16/2017  . Influenza,inj,Quad PF,6+ Mos 10/30/2014  . Influenza-Unspecified 11/08/2019  . PFIZER SARS-COV-2 Vaccination 03/11/2019, 04/11/2019  . Pneumococcal Conjugate-13 11/05/2015  . Pneumococcal Polysaccharide-23 01/06/2001, 10/22/2015, 08/05/2016  . Td 03/12/2003  . Zoster Recombinat (Shingrix) 08/06/2016   Pertinent  Health Maintenance Due  Topic Date Due  . INFLUENZA VACCINE  Completed  . DEXA SCAN  Completed  . PNA vac Low Risk Adult  Completed   Fall Risk  07/05/2019 11/07/2018 11/07/2018 07/24/2018 09/16/2017  Falls in  the past year? 1 1 1 1  No  Comment - - - - -  Number falls in past yr: 0 1 1 1  -  Injury with Fall? 0 0 0 1 -  Comment - - - knot on her head -  Risk for fall due to : Impaired balance/gait;Impaired mobility Impaired balance/gait;Impaired mobility Impaired balance/gait;Impaired mobility - -  Follow up Falls evaluation completed Falls evaluation completed Falls evaluation completed - -   Functional Status Survey:    Vitals:   12/27/19 1001  BP: (!) 91/56  Pulse: 97  Resp: 20  Temp: 97.7 F (36.5 C)  Weight: 140 lb 3.2 oz (63.6 kg)  Height: 5\' 2"  (1.575 m)   Body mass index is 25.64 kg/m. Physical Exam Vitals reviewed.  Constitutional:      General: She is not in acute distress. HENT:     Head: Normocephalic.     Right Ear: There is no impacted cerumen.     Left Ear: There is no impacted cerumen.     Nose: Nose normal.     Mouth/Throat:     Mouth: Mucous membranes are dry.     Pharynx: No posterior oropharyngeal erythema.  Eyes:     General:        Right eye: No discharge.        Left eye: No discharge.  Cardiovascular:     Rate and Rhythm: Normal rate and regular rhythm.     Pulses: Normal pulses.     Heart sounds: Normal heart sounds. No murmur heard.   Pulmonary:     Effort: Pulmonary effort is normal. No respiratory distress.     Breath sounds: Normal breath sounds. No wheezing.  Abdominal:     General: Abdomen is flat. Bowel sounds are normal. There is no distension.     Palpations: Abdomen is soft.     Tenderness: There is no abdominal tenderness.  Musculoskeletal:     Cervical back: Normal range of motion. No tenderness.     Right lower leg: Edema present.     Left lower leg: Edema present.     Comments: Non-pitting  Lymphadenopathy:     Cervical: No cervical adenopathy.  Skin:    General: Skin is warm and dry.     Capillary Refill: Capillary refill takes less than 2 seconds.     Comments: "UNSTAGEABLE PRESSURE INJURY TO SACRUM: measures 6.0 x 6.0 x  1.5cm, moderate amount of serous  exudate. Foul odor noted. Periwound intact. No periwound maceration noted. No indications of pain/discomfort. Lupita Leash, PA-C [facility], in facility on wound rounds 12/24/2019, wound assessed. Order in place to cleanse with NS, pat dry, pack with dakins moistened gauze and cover with foam dressing QD. Specialty air mattress in place. Order in place for prevalon boots to bilateral feet qd, while in bed, may remove for AM/PM care. Orders in place for Zinc, Ensure, Multivitamin, and Prostat will aide in wound healing. RR, Garret Reddish [phone] updated and agrees with treatment plan of care. Dr. Mariea Clonts updated in facility."  "STAGE 2 PRESSURE INJURY TO LEFT MEDIAL HEEL: measures 6.0 x 4.0 x 0.2cm, Small amount of serous exudate noted. No indications of infection noted. Periwound intact. No periwound maceration noted. Denies pain/discomfort. Lupita Leash, PA-C [facility], in facility on wound rounds 12/24/2019, wound assessed. Order in place to apply skin prep, cover with foam heel cup and wrap with kerlix QD. Order in place for prevalon boots to bilateral feet qd, while in bed, may remove for AM/PM care. Specialty air mattress in place. Orders in place for Zinc, Ensure, Multivitamin, and Prostat will aide in wound healing. RR, Garret Reddish [phone] updated and agrees with treatment plan of care. Dr. Mariea Clonts updated in facility.Lupita Leash, PA-C [facility], in facility on wound rounds 12/24/2019, note states "Ms. Hammersmith 88YOWF with sig PMHx muscle weakness, unsteadiness on feet, abnormalities of gait and mobility (LE contracture), CKD3, Covid 19, hypothyroidism, polyneuropathy, HLD, and dementia who is being seen for wound care. Recurrent left heel: note patient has had impaired mobility and contractures post Covid 19 diagnosis, she is working with therapy. No wound drainage. No wound odor. Will treat with larger vs last week with mild drainage. Note: patient has had impaired mobility and  contractures post Covid -19 diagnosis, she is working with therapy. Periwound intact. no periwound maceration. No wound odor. Will treat with skin prep + dry dressing Qday + offloading boots. Most recent labs were reviewed patient has low albumin and total protein which can impede wound health. Recommend dietary consult. tissue depth changed because wound is deeper. Sacrum wound: larger vs last week due to overall health deterioration. Recommend Xray to assess for osteomyelitis (given contractures unclear if MRI wound be feasible) Periwound intact. no periwound maceration. No wound pain. +wound odor +necrotic tissue. Wound was debrided using a No 15 blade after application of hurricane spray. No complications with procedure. No acute signs of infection. Will treat with moistened dakins gauze packing for wound odor and bioburden control + dry dressing + air mattress + offloading and frequent repositioning. Note: facility reports that the patient is currently on antibiotic therapy doxycycline. Supplements: ensure enlive BID. Treatment discussed with facility treatment team. she is working with therapy team. will follow up in one week."  Neurological:     General: No focal deficit present.     Mental Status: She is alert.     Motor: Weakness present.     Gait: Gait abnormal.  Psychiatric:        Attention and Perception: Attention normal.        Mood and Affect: Affect is flat.        Cognition and Memory: Memory is impaired.     Labs reviewed: Recent Labs    07/09/19 1219 07/09/19 1220 07/11/19 0323 08/08/19 1341 08/09/19 0507 08/10/19 0138 08/12/19 0157 08/23/19 0000 12/24/19 0000  NA  --    < > 139 145 146* 146* 138 137 136*  K  --    < >  3.9 3.7 3.7 3.5 4.1 3.7 3.8  CL  --    < > 109 110 111 110 108 99 99  CO2  --    < > 21* 22 22 25 22 22  25*  GLUCOSE  --    < > 134* 148* 99 131* 100*  --   --   BUN  --    < > 33* 31* 29* 26* 17 22* 28*  CREATININE  --    < > 1.24* 1.47* 1.15* 0.92  0.84 0.8 0.8  CALCIUM  --    < > 8.2* 8.9 8.3* 8.9 8.1* 8.8 8.0*  MG 2.3  --  1.8 1.9  --   --   --   --   --   PHOS 3.5  --   --   --   --   --   --   --   --    < > = values in this interval not displayed.   Recent Labs    07/10/19 0642 07/11/19 0323 08/08/19 1341 12/18/19 0000  AST 28 24 25   --   ALT 11 11 14   --   ALKPHOS 18* 18* 35*  --   BILITOT 0.7 0.6 0.9  --   PROT 5.3* 4.6* 6.4*  --   ALBUMIN 2.2* 1.9* 2.7* 2.3*   Recent Labs    07/09/19 1220 07/10/19 0642 08/08/19 1341 08/10/19 0138 08/12/19 0157 08/13/19 0119 08/16/19 0456 08/23/19 0000 12/21/19 0000  WBC 10.5   < > 13.8*   < > 9.8 9.4 8.7 9.3 23.7  NEUTROABS 8.1*  --  11.4*  --  6.3  --   --   --   --   HGB 11.9*   < > 12.4   < > 11.4* 11.0* 11.2* 10.6* 9.9*  HCT 37.8   < > 40.5   < > 35.0* 33.9* 34.3* 32* 31*  MCV 100.8*   < > 103.1*   < > 96.7 99.1 97.2  --   --   PLT 136*   < > 237   < > 229 215 340 339 451*   < > = values in this interval not displayed.   Lab Results  Component Value Date   TSH 2.731 07/09/2019   Lab Results  Component Value Date   HGBA1C 6.5 08/06/2016   Lab Results  Component Value Date   CHOL 128 07/05/2019   HDL 34.10 (L) 07/05/2019   LDLCALC 67 07/05/2019   LDLDIRECT 115.0 08/06/2016   TRIG 136.0 07/05/2019   CHOLHDL 4 07/05/2019    Significant Diagnostic Results in last 30 days:  No results found.  Assessment/Plan 1. Pressure injury, unstageable, with suspected deep tissue injury ([place]) - odor has improved since starting doxycycline, appears to be less drainage as well - doxy will be completed 12/19 - continue current dressing changes with dakins, daily wound care, air mattress, turning Q2hrs, and nutrition  2. Dementia without behavioral disturbance, unspecified dementia type ([place]) - has advanced within the past few months - Dr. Mariea Clonts has had discussions of ACP with family - future plans to complete MOST form  3. Failure to thrive in adult - ongoing, she  continues to lose weight even with nutritional supplementation - continue monthly weights  4. Generalized weakness - dependent with ADL's, too weak to grab a drink of water  5. Hypothyroidism, unspecified type - stable with medication - continue levothyroxine 75 mcg PO daily    Family/ staff  Communication: Plan discussed with patient and facility nurse  Labs/tests ordered:  none

## 2019-12-28 ENCOUNTER — Non-Acute Institutional Stay (SKILLED_NURSING_FACILITY): Payer: Medicare Other | Admitting: Internal Medicine

## 2019-12-28 ENCOUNTER — Encounter: Payer: Self-pay | Admitting: Internal Medicine

## 2019-12-28 DIAGNOSIS — R627 Adult failure to thrive: Secondary | ICD-10-CM | POA: Diagnosis not present

## 2019-12-28 DIAGNOSIS — L89300 Pressure ulcer of unspecified buttock, unstageable: Secondary | ICD-10-CM

## 2019-12-28 DIAGNOSIS — F0390 Unspecified dementia without behavioral disturbance: Secondary | ICD-10-CM | POA: Diagnosis not present

## 2019-12-28 DIAGNOSIS — Z7189 Other specified counseling: Secondary | ICD-10-CM | POA: Diagnosis not present

## 2019-12-28 DIAGNOSIS — L8995 Pressure ulcer of unspecified site, unstageable: Secondary | ICD-10-CM

## 2019-12-28 DIAGNOSIS — R531 Weakness: Secondary | ICD-10-CM

## 2019-12-28 NOTE — Progress Notes (Signed)
Location:  [facility] Room Number: [place]:  SNF (31) Provider:  Paw Karstens L. Mariea Clonts, D.O., C.M.D.  Gayland Curry, DO  Patient Care Team: Gayland Curry, DO as PCP - General (Geriatric Medicine) Marcial Pacas, MD as Consulting Physician (Neurology) Ladene Artist, MD as Consulting Physician (Gastroenterology) Madelin Rear, [facility] as Pharmacist (Pharmacist)  Extended Emergency Contact Information Primary Emergency Contact: Webb,Kristin Address: [address]          [place], [place] [address] Johnnette Litter of [place] Phone: [phone] Mobile Phone: [phone] Relation: Daughter Secondary Emergency Contact: Vanessa Barbara Address: [address]          [place], [place] [address] Johnnette Litter of [place] Phone: [phone] Mobile Phone: [phone] Relation: Daughter  Code Status:  DNR determined today when MOST completed--given to medical records to be scanned into AHT and vynca  Goals of care: Advanced Directive information Advanced Directives 12/28/2019  Does Patient Have a Medical Advance Directive? Yes  Type of Paramedic of [place];Living will  Does patient want to make changes to medical advance directive? No - Patient declined  Copy of [place] in Chart? Yes - validated most recent copy scanned in chart (See row information)  Would patient like information on creating a medical advance directive? -     Chief Complaint  Patient presents with  . Acute Visit    Care plan meeting with her son and daughter     HPI:  Pt is a 86 y.o. female seen today for an acute visit for care plan meeting for advance care planning with her daughter, Joelene Millin, in person, and son, Synetta Shadow, by phone, plus MDS nurse, social worker, dietitian and wound care nurse.  I had spoke with her daughter, [name], by phone last week and started the conversation.  Mrs. Petion has apparently been requesting to  pass on since her husband died fairly suddenly from cancer.  He was in the hospital just a few days before he died and had been active and doing well until those events.  The children note the different trajectory for their mother with a gradual decline.   We reviewed her wound progression, recent cellulitis of her sacral wound on abx thru 12/19, decreasing intake and declining nutritional status, progressing dementia, ADL dependence, inability to move on her own, declining weight.  Wound care nurse shared that bone could be probed in her wound during dressing changes.  Extensive education was provided about wounds, nutrition, dementia progression/expectations, hydration, end-of-life changes in the body.  We discussed options for treatment from aggressive to comfort measures.  We reviewed her previously completed living will document and used it as a guide to help her children with decision-making.  Family agreed that they would like for their mother to be kept as comfortable as possible.  MOST completed today as follows: DNR Comfort measures Determine antibiotics if infection occurs No IVFs (as per her living will) No tube feeding (as per her living will) She was requesting possible organ donation; however, this is often challenging to preserve organs adequately in snf environment vs hospital so family educated on that and accepted that.  Past Medical History:  Diagnosis Date  . Benign paroxysmal positional vertigo   . Diverticulosis   . Duodenal ulcer   . Endometrial cancer [facility])    age 13  . GERD (gastroesophageal reflux disease)   . Hypertension   . Hypothyroidism   . Memory loss   .  Peripheral neuropathy   . 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 CATARACT EXTRACTION    . CHOLECYSTECTOMY    . FOOT SURGERY     Right  . IR IVC FILTER PLMT / S&I /IMG GUID/MOD SED  08/10/2019  . KNEE SURGERY     Right  . VAGINAL  HYSTERECTOMY      Allergies  Allergen Reactions  . Bee Venom Shortness Of Breath, Itching, Rash and Other (See Comments)    Almost passed out, also  . Penicillins Rash    Has patient had a PCN reaction causing immediate rash, facial/tongue/throat swelling, SOB or lightheadedness with hypotension: Yes Has patient had a PCN reaction causing severe rash involving mucus membranes or skin necrosis: No Has patient had a PCN reaction that required hospitalization: Unknown Has patient had a PCN reaction occurring within the last 10 years: No If all of the above answers are "NO", then may proceed with Cephalosporin use.     Outpatient Encounter Medications as of 12/28/2019  Medication Sig  . acetaminophen (TYLENOL) 500 MG tablet Take 500 mg by mouth in the morning and at bedtime.  . Amino Acids-Protein Hydrolys (FEEDING SUPPLEMENT, PRO-STAT 64,) LIQD Take 30 mLs by mouth in the morning and at bedtime.  . CVS D3 50 MCG (2000 UT) CAPS TAKE 1 CAPSULE DAILY  . diphenhydrAMINE (BENADRYL) 25 MG tablet Take 2 tablets (50 mg total) by mouth every 6 (six) hours as needed (hives or severe allergic reaction).  . [EXPIRED] doxycycline (VIBRA-TABS) 100 MG tablet Take 1 tablet (100 mg total) by mouth 2 (two) times daily for 10 days.  Marland Kitchen EPINEPHrine 0.3 mg/0.3 mL IJ SOAJ injection Inject 0.3 mg into the muscle as needed for anaphylaxis. INJECT 0.3 mLs (0.3 MG) INTO THE MUSCLE ONCE AS NEEDED FOR UP TO 1 DOSE (SEVERE ALLERGIC REACTION)  . feeding supplement, ENSURE ENLIVE, (ENSURE ENLIVE) LIQD Take 237 mLs by mouth 2 (two) times daily between meals.  . fluticasone (FLONASE) 50 MCG/ACT nasal spray Place 2 sprays into both nostrils daily as needed (for seasonal allergies). SPRAY 2 SPRAYS INTO EACH NOSTRIL EVERY DAY  . levothyroxine (SYNTHROID) 75 MCG tablet TAKE 1 TABLET BY MOUTH EVERY DAY  . loperamide (IMODIUM) 2 MG capsule Take 2 mg by mouth as needed for diarrhea or loose stools.   . memantine (NAMENDA) 10 MG  tablet Take 1 tablet (10 mg total) by mouth 2 (two) times daily. TAKE 1 TABLET BY MOUTH TWICE A DAY.  . Multiple Vitamin (MULTIVITAMIN WITH MINERALS) TABS tablet Take 1 tablet by mouth daily.  . nutrition supplement, JUVEN, (JUVEN) PACK Take 1 packet by mouth 2 (two) times daily between meals. To promote wound healing   No facility-administered encounter medications on file as of 12/28/2019.    Review of Systems  Constitutional: Positive for weight loss. Negative for chills, fever and malaise/fatigue.  HENT: Negative for congestion and sore throat.   Eyes: Negative for blurred vision.  Respiratory: Negative for cough and shortness of breath.   Cardiovascular: Positive for leg swelling. Negative for chest pain.  Gastrointestinal: Negative for abdominal pain, blood in stool, constipation, diarrhea and melena.  Genitourinary: Negative for dysuria.       Incontinence  Musculoskeletal: Negative for back pain, falls, joint pain, myalgias and neck pain.  Skin:       unstageable sacral ulcer, heel ulcer  Neurological: Positive for weakness. Negative for dizziness and loss of consciousness.  Psychiatric/Behavioral: Positive for memory loss. Negative for depression. The patient is not nervous/anxious and does not have insomnia.     Immunization History  Administered Date(s) Administered  . Fluad Quad(high Dose 65+) 11/07/2018  . Influenza, High Dose Seasonal PF 10/22/2015, 10/14/2016, 09/16/2017  . Influenza,inj,Quad PF,6+ Mos 10/30/2014  . Influenza-Unspecified 11/08/2019  . PFIZER SARS-COV-2 Vaccination 03/11/2019, 04/11/2019  . Pneumococcal Conjugate-13 11/05/2015  . Pneumococcal Polysaccharide-23 01/06/2001, 10/22/2015, 08/05/2016  . Td 03/12/2003  . Zoster Recombinat (Shingrix) 08/06/2016   Pertinent  Health Maintenance Due  Topic Date Due  . INFLUENZA VACCINE  Completed  . DEXA SCAN  Completed  . PNA vac Low Risk Adult  Completed   Fall Risk  07/05/2019 11/07/2018 11/07/2018  07/24/2018 09/16/2017  Falls in the past year? 1 1 1 1  No  Comment - - - - -  Number falls in past yr: 0 1 1 1  -  Injury with Fall? 0 0 0 1 -  Comment - - - knot on her head -  Risk for fall due to : Impaired balance/gait;Impaired mobility Impaired balance/gait;Impaired mobility Impaired balance/gait;Impaired mobility - -  Follow up Falls evaluation completed Falls evaluation completed Falls evaluation completed - -   Functional Status Survey:    Vitals:   12/28/19 1033  BP: (!) 167/90  Pulse: 68  Temp: (!) 97.5 F (36.4 C)  Weight: 140 lb 3.2 oz (63.6 kg)  Height: 5\' 2"  (1.575 m)   Body mass index is 25.64 kg/m. Physical Exam Vitals reviewed.  Constitutional:      Appearance: Normal appearance.  HENT:     Head: Normocephalic and atraumatic.  Cardiovascular:     Rate and Rhythm: Normal rate and regular rhythm.     Pulses: Normal pulses.     Heart sounds: Normal heart sounds.  Pulmonary:     Effort: Pulmonary effort is normal.     Breath sounds: Normal breath sounds. No wheezing, rhonchi or rales.  Abdominal:     General: Bowel sounds are normal.  Musculoskeletal:        General: Normal range of motion.     Right lower leg: Edema present.     Left lower leg: Edema present.     Comments: Shiny swelling of legs   Skin:    Comments: Sacral wound no longer erythematous, odor improved  Neurological:     Mental Status: She is alert. Mental status is at baseline.     Motor: Weakness present.     Gait: Gait abnormal.     Comments: [place] used for transfers, thinks she's at home  Psychiatric:        Mood and Affect: Mood normal.     Comments: Pleasant with all appropriate social responses      Labs reviewed: Recent Labs    07/09/19 1219 07/09/19 1220 07/11/19 0323 08/08/19 1341 08/09/19 0507 08/10/19 0138 08/12/19 0157 08/23/19 0000 12/24/19 0000  NA  --    < > 139 145 146* 146* 138 137 136*  K  --    < > 3.9 3.7 3.7 3.5 4.1 3.7 3.8  CL  --    < > 109 110 111  110 108 99 99  CO2  --    < > 21* 22 22 25 22 22  25*  GLUCOSE  --    < > 134* 148* 99 131* 100*  --   --   BUN  --    < > 33* 31* 29* 26* 17 22* 28*  CREATININE  --    < > 1.24* 1.47* 1.15* 0.92 0.84 0.8 0.8  CALCIUM  --    < > 8.2* 8.9 8.3* 8.9 8.1* 8.8 8.0*  MG 2.3  --  1.8 1.9  --   --   --   --   --   PHOS 3.5  --   --   --   --   --   --   --   --    < > = values in this interval not displayed.   Recent Labs    07/10/19 0642 07/11/19 0323 08/08/19 1341 12/18/19 0000  AST 28 24 25   --   ALT 11 11 14   --   ALKPHOS 18* 18* 35*  --   BILITOT 0.7 0.6 0.9  --   PROT 5.3* 4.6* 6.4*  --   ALBUMIN 2.2* 1.9* 2.7* 2.3*   Recent Labs    07/09/19 1220 07/10/19 0642 08/08/19 1341 08/10/19 0138 08/12/19 0157 08/13/19 0119 08/16/19 0456 08/23/19 0000 12/21/19 0000  WBC 10.5   < > 13.8*   < > 9.8 9.4 8.7 9.3 23.7  NEUTROABS 8.1*  --  11.4*  --  6.3  --   --   --   --   HGB 11.9*   < > 12.4   < > 11.4* 11.0* 11.2* 10.6* 9.9*  HCT 37.8   < > 40.5   < > 35.0* 33.9* 34.3* 32* 31*  MCV 100.8*   < > 103.1*   < > 96.7 99.1 97.2  --   --   PLT 136*   < > 237   < > 229 215 340 339 451*   < > = values in this interval not displayed.   Lab Results  Component Value Date   TSH 2.731 07/09/2019   Lab Results  Component Value Date   HGBA1C 6.5 08/06/2016   Lab Results  Component Value Date   CHOL 128 07/05/2019   HDL 34.10 (L) 07/05/2019   LDLCALC 67 07/05/2019   LDLDIRECT 115.0 08/06/2016   TRIG 136.0 07/05/2019   CHOLHDL 4 07/05/2019     Assessment/Plan 1. ACP (advance care planning) -goals are now comfort-based as discussed with family -see MOST which is to be scanned into matrix AND vynca (not yet) -avoid labs unless discussed with family -also discussed hospice care and Joelene Millin and Synetta Shadow were going to discuss with [name] and Geraldo Pitter, Education officer, museum re: financial aspects -could be done in house, of course -1 hour spent on ACP  2. Dementia without behavioral disturbance,  unspecified dementia type ([place]) -moderate to advanced now -is happy, content and comfortable which is family goal  3. Failure to thrive in adult -opted to continue supplements at this time but go to monthly weights  4. Pressure injury of buttock, unstageable, unspecified laterality ([place]) -continue current wound care per wound care team, NP, as per prior note -cont supplement for now and monitor--if refuses them, would stop, but still accepting some part of them  5. Pressure injury, unstageable, with suspected deep tissue injury ([place]) -cont current mgt for heel as well  6. Generalized weakness -due to advancing dementia, failure to thrive, continue comfort care, avoid hospitalization  Family/ staff Communication: d/w three children and staff team as above  Labs/tests ordered:  Opted not to check more labs at this time  One hour on ACP and 25 mins on med mgt  Jaxiel Kines L. Rashauna Tep, D.O. [facility] [address], [place] [address] Cell Phone (Mon-Fri 8am-5pm):  [phone] On Call:  [phone] & follow prompts after 5pm & weekends Office Phone:  [phone] Office Fax:  [phone]

## 2020-01-02 ENCOUNTER — Telehealth: Payer: Self-pay | Admitting: Orthopedic Surgery

## 2020-01-02 NOTE — Telephone Encounter (Signed)
Daughter [name] called to verify DNR status. She agreed to DNR. Pain control during dressing changes discussed with daughter.

## 2020-01-07 ENCOUNTER — Encounter: Payer: Self-pay | Admitting: Orthopedic Surgery

## 2020-01-07 NOTE — Progress Notes (Deleted)
Location:      Place of Service:    Provider:  Tiffany L. Mariea Clonts, D.O., C.M.D.  Gayland Curry, DO  Patient Care Team: Gayland Curry, DO as PCP - General (Geriatric Medicine) Marcial Pacas, MD as Consulting Physician (Neurology) Ladene Artist, MD as Consulting Physician (Gastroenterology) Madelin Rear, [facility] as Pharmacist (Pharmacist)  Extended Emergency Contact Information Primary Emergency Contact: Webb,Kristin Address: [address]          [place], [place] [address] Johnnette Litter of [place] Phone: [phone] Mobile Phone: [phone] Relation: Daughter Secondary Emergency Contact: Vanessa Barbara Address: [address]          [place], [place] [address] Johnnette Litter of [place] Phone: [phone] Mobile Phone: [phone] Relation: Daughter  Code Status:  *** Goals of care: Advanced Directive information Advanced Directives 12/28/2019  Does Patient Have a Medical Advance Directive? Yes  Type of Paramedic of [place];Living will  Does patient want to make changes to medical advance directive? No - Patient declined  Copy of [place] in Chart? Yes - validated most recent copy scanned in chart (See row information)  Would patient like information on creating a medical advance directive? -     No chief complaint on file.   HPI:  Pt is a 86 y.o. female seen today for medical management of chronic diseases.     Past Medical History:  Diagnosis Date  . Benign paroxysmal positional vertigo   . Diverticulosis   . Duodenal ulcer   . Endometrial cancer [facility])    age 4  . GERD (gastroesophageal reflux disease)   . Hypertension   . Hypothyroidism   . Memory loss   . Peripheral neuropathy   . 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 CATARACT EXTRACTION    . CHOLECYSTECTOMY    . FOOT SURGERY     Right  . IR IVC FILTER PLMT / S&I /IMG GUID/MOD SED   08/10/2019  . KNEE SURGERY     Right  . VAGINAL HYSTERECTOMY      Allergies  Allergen Reactions  . Bee Venom Shortness Of Breath, Itching, Rash and Other (See Comments)    Almost passed out, also  . Penicillins Rash    Has patient had a PCN reaction causing immediate rash, facial/tongue/throat swelling, SOB or lightheadedness with hypotension: Yes Has patient had a PCN reaction causing severe rash involving mucus membranes or skin necrosis: No Has patient had a PCN reaction that required hospitalization: Unknown Has patient had a PCN reaction occurring within the last 10 years: No If all of the above answers are "NO", then may proceed with Cephalosporin use.     Outpatient Encounter Medications as of 01/07/2020  Medication Sig  . acetaminophen (TYLENOL) 500 MG tablet Take 500 mg by mouth in the morning and at bedtime.  . Amino Acids-Protein Hydrolys (FEEDING SUPPLEMENT, PRO-STAT 64,) LIQD Take 30 mLs by mouth in the morning and at bedtime.  . CVS D3 50 MCG (2000 UT) CAPS TAKE 1 CAPSULE DAILY  . diphenhydrAMINE (BENADRYL) 25 MG tablet Take 2 tablets (50 mg total) by mouth every 6 (six) hours as needed (hives or severe allergic reaction).  Marland Kitchen EPINEPHrine 0.3 mg/0.3 mL IJ SOAJ injection Inject 0.3 mg into the muscle as needed for anaphylaxis. INJECT 0.3 mLs (0.3 MG) INTO THE MUSCLE ONCE AS NEEDED FOR UP TO 1 DOSE (SEVERE ALLERGIC REACTION)  . feeding supplement,  ENSURE ENLIVE, (ENSURE ENLIVE) LIQD Take 237 mLs by mouth 2 (two) times daily between meals.  . fluticasone (FLONASE) 50 MCG/ACT nasal spray Place 2 sprays into both nostrils daily as needed (for seasonal allergies). SPRAY 2 SPRAYS INTO EACH NOSTRIL EVERY DAY  . levothyroxine (SYNTHROID) 75 MCG tablet TAKE 1 TABLET BY MOUTH EVERY DAY  . loperamide (IMODIUM) 2 MG capsule Take 2 mg by mouth as needed for diarrhea or loose stools.   . memantine (NAMENDA) 10 MG tablet Take 1 tablet (10 mg total) by mouth 2 (two) times daily. TAKE 1  TABLET BY MOUTH TWICE A DAY.  . Multiple Vitamin (MULTIVITAMIN WITH MINERALS) TABS tablet Take 1 tablet by mouth daily.  . nutrition supplement, JUVEN, (JUVEN) PACK Take 1 packet by mouth 2 (two) times daily between meals. To promote wound healing   No facility-administered encounter medications on file as of 01/07/2020.    ROS  Immunization History  Administered Date(s) Administered  . Fluad Quad(high Dose 65+) 11/07/2018  . Influenza, High Dose Seasonal PF 10/22/2015, 10/14/2016, 09/16/2017  . Influenza,inj,Quad PF,6+ Mos 10/30/2014  . Influenza-Unspecified 11/08/2019  . PFIZER SARS-COV-2 Vaccination 03/11/2019, 04/11/2019  . Pneumococcal Conjugate-13 11/05/2015  . Pneumococcal Polysaccharide-23 01/06/2001, 10/22/2015, 08/05/2016  . Td 03/12/2003  . Zoster Recombinat (Shingrix) 08/06/2016   Pertinent  Health Maintenance Due  Topic Date Due  . INFLUENZA VACCINE  Completed  . DEXA SCAN  Completed  . PNA vac Low Risk Adult  Completed   Fall Risk  07/05/2019 11/07/2018 11/07/2018 07/24/2018 09/16/2017  Falls in the past year? 1 1 1 1  No  Comment - - - - -  Number falls in past yr: 0 1 1 1  -  Injury with Fall? 0 0 0 1 -  Comment - - - knot on her head -  Risk for fall due to : Impaired balance/gait;Impaired mobility Impaired balance/gait;Impaired mobility Impaired balance/gait;Impaired mobility - -  Follow up Falls evaluation completed Falls evaluation completed Falls evaluation completed - -   Functional Status Survey:    There were no vitals filed for this visit. There is no height or weight on file to calculate BMI. Physical Exam  Labs reviewed: Recent Labs    07/09/19 1219 07/09/19 1220 07/11/19 0323 08/08/19 1341 08/09/19 0507 08/10/19 0138 08/12/19 0157 08/23/19 0000 12/24/19 0000  NA  --    < > 139 145 146* 146* 138 137 136*  K  --    < > 3.9 3.7 3.7 3.5 4.1 3.7 3.8  CL  --    < > 109 110 111 110 108 99 99  CO2  --    < > 21* 22 22 25 22 22  25*  GLUCOSE  --     < > 134* 148* 99 131* 100*  --   --   BUN  --    < > 33* 31* 29* 26* 17 22* 28*  CREATININE  --    < > 1.24* 1.47* 1.15* 0.92 0.84 0.8 0.8  CALCIUM  --    < > 8.2* 8.9 8.3* 8.9 8.1* 8.8 8.0*  MG 2.3  --  1.8 1.9  --   --   --   --   --   PHOS 3.5  --   --   --   --   --   --   --   --    < > = values in this interval not displayed.   Recent Labs    07/10/19  [id number] 07/11/19 0323 08/08/19 1341 12/18/19 0000  AST 28 24 25   --   ALT 11 11 14   --   ALKPHOS 18* 18* 35*  --   BILITOT 0.7 0.6 0.9  --   PROT 5.3* 4.6* 6.4*  --   ALBUMIN 2.2* 1.9* 2.7* 2.3*   Recent Labs    07/09/19 1220 07/10/19 0642 08/08/19 1341 08/10/19 0138 08/12/19 0157 08/13/19 0119 08/16/19 0456 08/23/19 0000 12/21/19 0000  WBC 10.5   < > 13.8*   < > 9.8 9.4 8.7 9.3 23.7  NEUTROABS 8.1*  --  11.4*  --  6.3  --   --   --   --   HGB 11.9*   < > 12.4   < > 11.4* 11.0* 11.2* 10.6* 9.9*  HCT 37.8   < > 40.5   < > 35.0* 33.9* 34.3* 32* 31*  MCV 100.8*   < > 103.1*   < > 96.7 99.1 97.2  --   --   PLT 136*   < > 237   < > 229 215 340 339 451*   < > = values in this interval not displayed.   Lab Results  Component Value Date   TSH 2.731 07/09/2019   Lab Results  Component Value Date   HGBA1C 6.5 08/06/2016   Lab Results  Component Value Date   CHOL 128 07/05/2019   HDL 34.10 (L) 07/05/2019   LDLCALC 67 07/05/2019   LDLDIRECT 115.0 08/06/2016   TRIG 136.0 07/05/2019   CHOLHDL 4 07/05/2019    Significant Diagnostic Results in last 30 days:  No results found.  Assessment/Plan There are no diagnoses linked to this encounter.   Family/ staff Communication: ***  Labs/tests ordered:  ***   Tiffany L. Reed, D.O. [facility] [address], [place] [address] Cell Phone (Mon-Fri 8am-5pm):  [phone] On Call:  [phone] & follow prompts after 5pm & weekends Office Phone:  [phone] Office Fax:  [phone]  This encounter was created  in error - please disregard.

## 2020-01-09 NOTE — Progress Notes (Signed)
This encounter was created in error - please disregard.

## 2020-01-10 ENCOUNTER — Telehealth: Payer: Self-pay | Admitting: Adult Health

## 2020-01-10 NOTE — Telephone Encounter (Signed)
Nurse from [employer] called to report that Kristina [name] had passed away. She is a DNR. Nurse reported that this was an expected death. Order given to release to the funeral home.

## 2020-01-12 DEATH — deceased

## 2020-01-15 ENCOUNTER — Encounter: Payer: Self-pay | Admitting: Internal Medicine

## 2021-06-26 IMAGING — MR MRI HEAD WITHOUT CONTRAST
11 of 12 series · 43 of 48 positions shown · non-contrast
Comparison: 07/15/2018 CT head.  10/18/2015 MRI head.

CLINICAL DATA: 86 y/o F; generalized weakness, fatigue, frequent
falls.

EXAM:
MRI HEAD WITHOUT CONTRAST
TECHNIQUE: Multiplanar, multiecho pulse sequences of the brain and surrounding
structures were obtained without intravenous contrast.

[Series 5: DWI · axial · 3.0mm · 0.88mm/px · z∈[-33,+102]mm · 9 of 92 slices shown (1 of 4)]
[im 1/92]
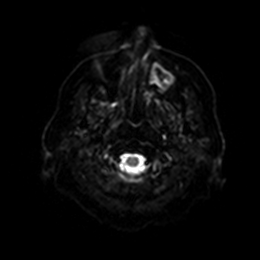
[im 12/92]
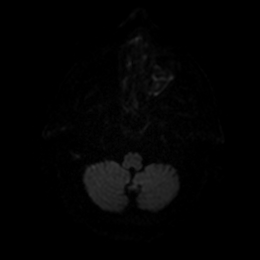
[im 23/92]
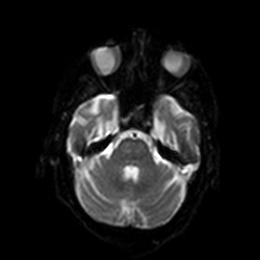
[im 35/92]
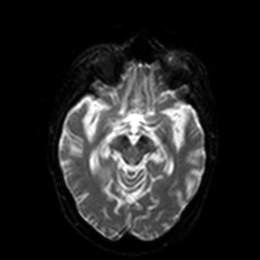
[im 46/92]
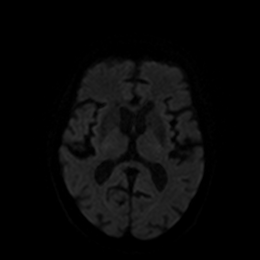
[im 57/92]
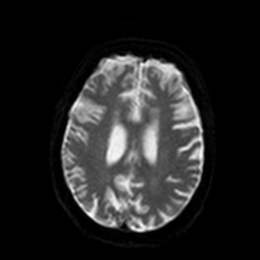
[im 69/92]
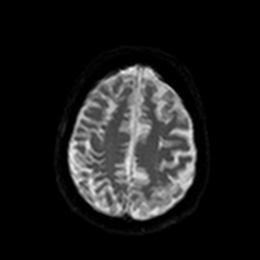
[im 80/92]
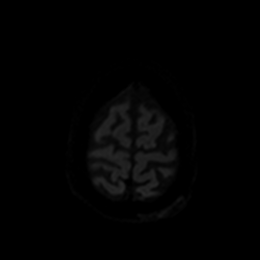
[im 92/92]
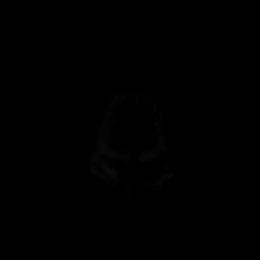

[Series 6: DWI · axial · 3.0mm · 0.88mm/px · z∈[-33,+102]mm · 4 of 46 slices shown (2 of 4)]
[im 1/46]
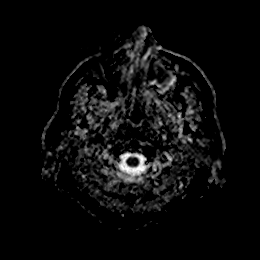
[im 16/46]
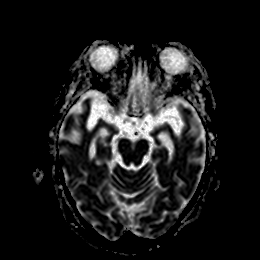
[im 31/46]
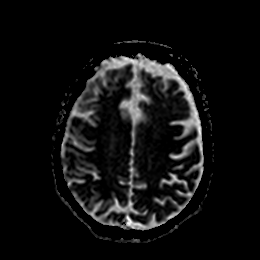
[im 46/46]
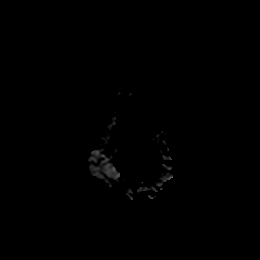

[Series 7: DWI · coronal · 4.0mm · 0.88mm/px · 6 of 67 slices shown (3 of 4)]
[im 1/67]
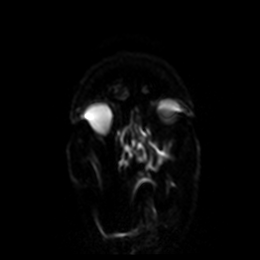
[im 14/67]
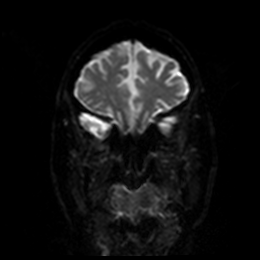
[im 27/67]
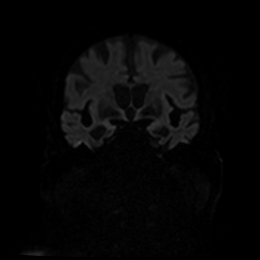
[im 40/67]
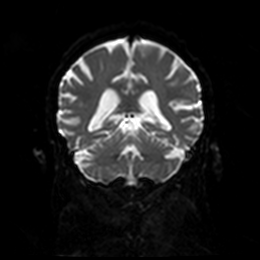
[im 53/67]
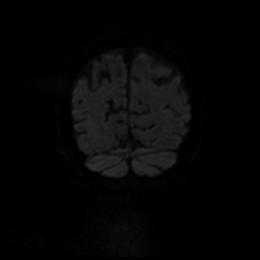
[im 67/67]
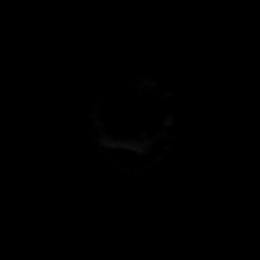

[Series 8: DWI · coronal · 4.0mm · 0.88mm/px · 3 of 34 slices shown (4 of 4)]
[im 1/34]
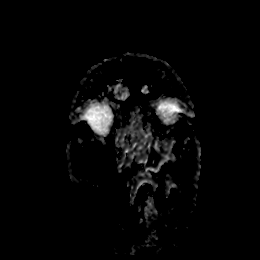
[im 17/34]
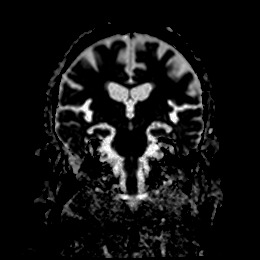
[im 34/34]
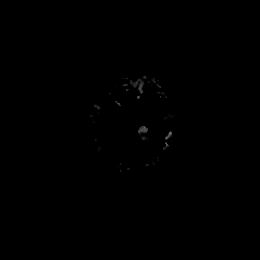

[Series 9: T1 · sagittal · 5.0mm · 0.75mm/px · 2 of 23 slices shown]
[im 1/23]
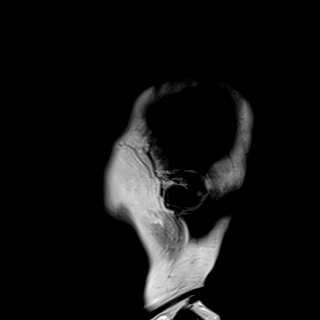
[im 23/23]
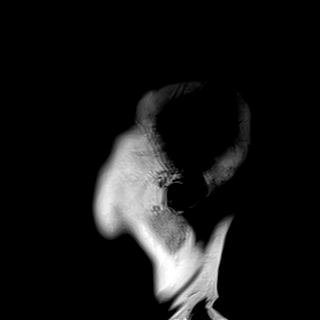

[Series 10: T2 · axial · 5.0mm · 0.72mm/px · z∈[-40,+104]mm · 2 of 25 slices shown (1 of 3)]
[im 1/25]
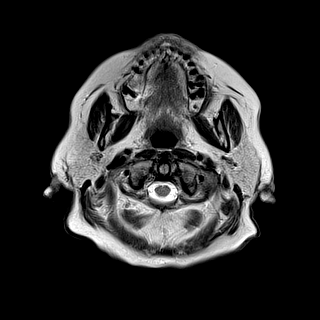
[im 25/25]
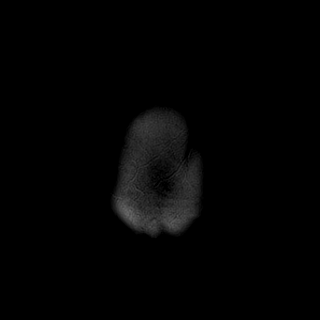

[Series 11: FLAIR · axial · 5.0mm · 0.90mm/px · z∈[-40,+104]mm · 2 of 25 slices shown]
[im 1/25]
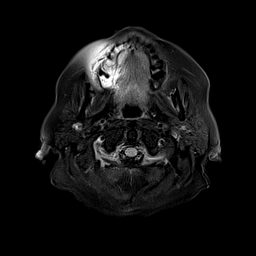
[im 25/25]
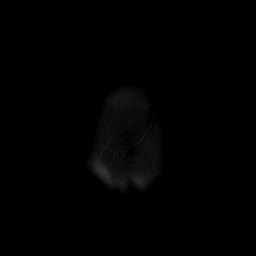

[Series 13: pha_images · axial · 3.0mm · 0.90mm/px · z∈[-50,+109]mm · 5 of 52 slices shown]
[im 1/52]
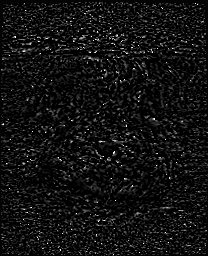
[im 13/52]
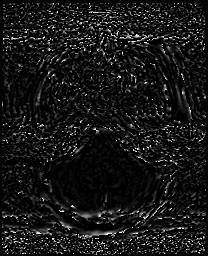
[im 26/52]
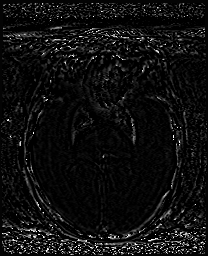
[im 39/52]
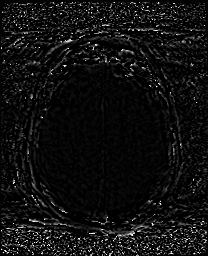
[im 52/52]
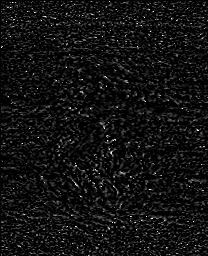

[Series 14: swi_images · axial · 3.0mm · 0.90mm/px · z∈[-56,+121]mm · 5 of 60 slices shown]
[im 1/60]
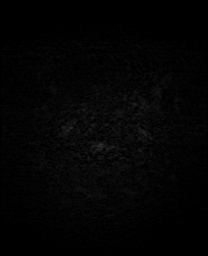
[im 15/60]
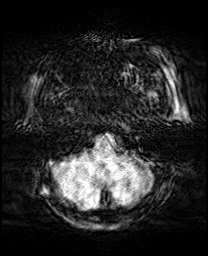
[im 30/60]
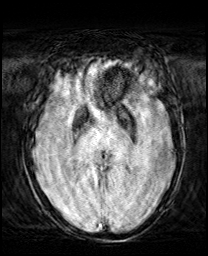
[im 45/60]
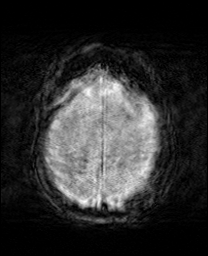
[im 60/60]
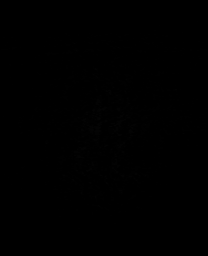

[Series 17: T2 · coronal · 5.0mm · 0.72mm/px · 3 of 29 slices shown (2 of 3)]
[im 1/29]
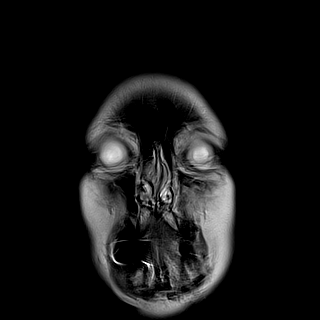
[im 15/29]
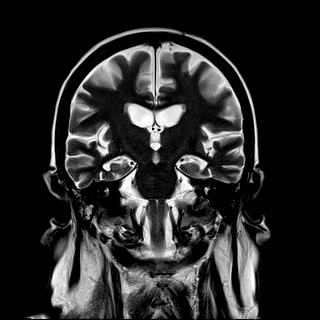
[im 29/29]
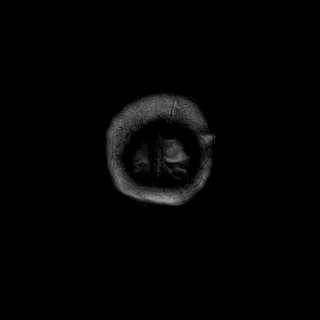

[Series 18: T2 · axial · 5.0mm · 0.72mm/px · z∈[-40,+104]mm · 2 of 25 slices shown (3 of 3)]
[im 1/25]
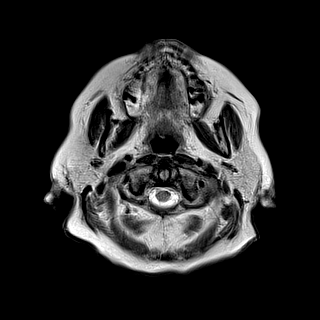
[im 25/25]
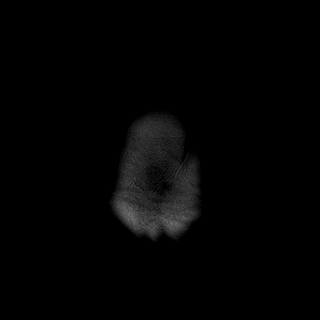

[43 of 48 positions shown; findings below may reference images not displayed]

FINDINGS: Brain: No acute infarction, hemorrhage, hydrocephalus, extra-axial
collection or mass lesion. Moderate volume loss of the brain
including prominent volume loss in the anteromedial temporal lobes.
Mild chronic microvascular ischemic changes of periventricular white
matter.

Vascular: Normal flow voids.

Skull and upper cervical spine: Normal marrow signal.

Sinuses/Orbits: Left sphenoid sinus mucous and left maxillary sinus
mucous retention cysts and mucosal thickening. Partial opacification
of right mastoid air cells. No abnormal signal of additional
included paranasal sinuses or the left mastoid air cells.

Other: None.
IMPRESSION: 1. No acute intracranial abnormality identified.
2. Stable mild chronic microvascular ischemic changes. Stable
moderate volume loss of the brain including prominent volume loss in
the anteromedial temporal lobe which can be seen with
neurodegenerative disorders.
3. Left maxillary and sphenoid sinus disease. Right mastoid air cell
partial opacification.
